# Patient Record
Sex: Female | Born: 1945
Health system: Southern US, Community
[De-identification: ages and names within clinical notes are randomized; demographics above are authoritative.]

## PROBLEM LIST (undated history)

## (undated) DIAGNOSIS — Z85528 Personal history of other malignant neoplasm of kidney: Secondary | ICD-10-CM

## (undated) DIAGNOSIS — K635 Polyp of colon: Secondary | ICD-10-CM

## (undated) DIAGNOSIS — M47812 Spondylosis without myelopathy or radiculopathy, cervical region: Secondary | ICD-10-CM

## (undated) DIAGNOSIS — K5732 Diverticulitis of large intestine without perforation or abscess without bleeding: Secondary | ICD-10-CM

## (undated) DIAGNOSIS — E785 Hyperlipidemia, unspecified: Secondary | ICD-10-CM

## (undated) DIAGNOSIS — I10 Essential (primary) hypertension: Secondary | ICD-10-CM

## (undated) DIAGNOSIS — G2581 Restless legs syndrome: Secondary | ICD-10-CM

## (undated) DIAGNOSIS — K219 Gastro-esophageal reflux disease without esophagitis: Secondary | ICD-10-CM

## (undated) DIAGNOSIS — I519 Heart disease, unspecified: Secondary | ICD-10-CM

## (undated) DIAGNOSIS — M810 Age-related osteoporosis without current pathological fracture: Secondary | ICD-10-CM

## (undated) DIAGNOSIS — M19019 Primary osteoarthritis, unspecified shoulder: Secondary | ICD-10-CM

## (undated) DIAGNOSIS — E119 Type 2 diabetes mellitus without complications: Secondary | ICD-10-CM

## (undated) DIAGNOSIS — D509 Iron deficiency anemia, unspecified: Secondary | ICD-10-CM

## (undated) HISTORY — DX: Iron deficiency anemia, unspecified: D50.9

## (undated) HISTORY — PX: KNEE SURGERY: SHX244

## (undated) HISTORY — DX: Type 2 diabetes mellitus without complications: E11.9

## (undated) HISTORY — DX: Restless legs syndrome: G25.81

## (undated) HISTORY — DX: Gastro-esophageal reflux disease without esophagitis: K21.9

## (undated) HISTORY — DX: Personal history of other malignant neoplasm of kidney: Z85.528

## (undated) HISTORY — DX: Polyp of colon: K63.5

## (undated) HISTORY — DX: Heart disease, unspecified: I51.9

## (undated) HISTORY — DX: Spondylosis without myelopathy or radiculopathy, cervical region: M47.812

## (undated) HISTORY — DX: Age-related osteoporosis without current pathological fracture: M81.0

## (undated) HISTORY — DX: Primary osteoarthritis, unspecified shoulder: M19.019

## (undated) HISTORY — PX: BUNIONECTOMY: SHX129

## (undated) HISTORY — DX: Essential (primary) hypertension: I10

## (undated) HISTORY — DX: Hyperlipidemia, unspecified: E78.5

## (undated) HISTORY — DX: Diverticulitis of large intestine without perforation or abscess without bleeding: K57.32

---

## 1975-03-18 HISTORY — PX: APPENDECTOMY: SHX54

## 1975-03-18 HISTORY — PX: VAGINAL HYSTERECTOMY: SUR661

## 2010-06-07 ENCOUNTER — Encounter (INDEPENDENT_AMBULATORY_CARE_PROVIDER_SITE_OTHER): Payer: Self-pay | Admitting: *Deleted

## 2010-07-01 ENCOUNTER — Ambulatory Visit: Payer: Self-pay | Admitting: Gastroenterology

## 2010-08-07 ENCOUNTER — Ambulatory Visit (INDEPENDENT_AMBULATORY_CARE_PROVIDER_SITE_OTHER): Payer: BC Managed Care – PPO | Admitting: Gastroenterology

## 2010-08-07 ENCOUNTER — Encounter: Payer: Self-pay | Admitting: Gastroenterology

## 2010-08-07 VITALS — BP 102/56 | HR 68 | Ht 61.0 in | Wt 181.0 lb

## 2010-08-07 DIAGNOSIS — R1032 Left lower quadrant pain: Secondary | ICD-10-CM

## 2010-08-07 NOTE — Patient Instructions (Signed)
Follow up with your primary Gastroenterologis, Dr. Jonelle Sports (you are already seeing him next month). A copy of this information will be made available to  Dr. Tomasa Blase.

## 2010-08-07 NOTE — Progress Notes (Signed)
HPI: This is a  very pleasant 65 year old woman  Was in urgent clinic and then ER.  She was told she had diverticulitis by one MD and that she had a "bug" by another MD.  She was put on 2 antibiotics that she cannot recall, given by her PCP, treated as if she had divericulitis.  Pain was in lower abdomen.  Hurt to sit, stand. She had a minor fever but no WBC.  A CT was "normal" per pcp note.    She really has felt fine since then.  No fevers or chills.  She sees Dr. Braulio Conte in [place] for her gastroenterology care.   She was going to see him for this but couldn't get in to see him soon enough so was set up here.  She sees hiim every 3-6 months and will be seeing him in less than a month ago.   Review of systems: Pertinent positive and negative review of systems were noted in the above HPI section.  All other review of systems was otherwise negative.   Past Medical History, Past Surgical History, Family History, Social History, Current Medications, Allergies were all reviewed with the patient via [facility] electronic medical record system.   Physical Exam: BP 102/56  Pulse 68  Ht 5\' 1"  (1.549 m)  Wt 181 lb (82.101 kg)  BMI 34.20 kg/m2 Constitutional: generally well-appearing Psychiatric: alert and oriented x3 Eyes: extraocular movements intact Mouth: oral pharynx moist, no lesions Neck: supple no lymphadenopathy Cardiovascular: heart regular rate and rhythm Lungs: clear to auscultation bilaterally Abdomen: soft, nontender, nondistended, no obvious ascites, no peritoneal signs, normal bowel sounds Extremities: no lower extremity edema bilaterally Skin: no lesions on visible extremities    Assessment and plan: 65 y.o. female with resolved left-sided abdominal pain  I did not mention above that she had a colonoscopy in 2008 by her primary gastroenterologist. She is scheduled to see him again next month since he follows her for her anemia. There are no acute GI issues  going on now I recommended that she continue her followup with her gastroenterologist, she is very happy with him and does not want to switch care. Her abdominal pains that led to her initial workup have resolved

## 2010-09-09 NOTE — Letter (Signed)
Summary: New Patient letter  [facility]  [address] [place], [place] [address]   Phone: [phone]  Fax: [phone]       06/07/2010 MRN: [id number]  Julia Logan [address] [place], [place]  [address]  [place]  Dear Ms. Julia Logan,  Welcome to the Gastroenterology Division at [facility].    You are scheduled to see Dr.  Christella Hartigan on 07-01-10 at 8:30A.M. on the 3rd floor at [facility], [address]  We ask that you try to arrive at our office 15 minutes prior to your appointment time to allow for check-in.  We would like you to complete the enclosed self-administered evaluation form prior to your visit and bring it with you on the day of your appointment.  We will review it with you.  Also, please bring a complete list of all your medications or, if you prefer, bring the medication bottles and we will list them.  Please bring your insurance card so that we may make a copy of it.  If your insurance requires a referral to see a specialist, please bring your referral form from your primary care physician.  Co-payments are due at the time of your visit and may be paid by cash, check or credit card.     Your office visit will consist of a consult with your physician (includes a physical exam), any laboratory testing he/she may order, scheduling of any necessary diagnostic testing (e.g. x-ray, ultrasound, CT-scan), and scheduling of a procedure (e.g. Endoscopy, Colonoscopy) if required.  Please allow enough time on your schedule to allow for any/all of these possibilities.    If you cannot keep your appointment, please call [phone] to cancel or reschedule prior to your appointment date.  This allows [place] the opportunity to schedule an appointment for another patient in need of care.  If you do not cancel or reschedule by 5 p.m. the business day prior to your appointment date, you will be charged a $50.00 late cancellation/no-show fee.    Thank you for choosing [facility] for your medical needs.  We appreciate the opportunity to care for you.  Please visit [place] at our website  to learn more about our practice.                     Sincerely,                                                             The Gastroenterology Division

## 2015-03-18 HISTORY — PX: PARTIAL NEPHRECTOMY: SHX414

## 2015-09-24 DIAGNOSIS — N390 Urinary tract infection, site not specified: Secondary | ICD-10-CM | POA: Insufficient documentation

## 2015-09-24 HISTORY — DX: Urinary tract infection, site not specified: N39.0

## 2015-10-04 DIAGNOSIS — C649 Malignant neoplasm of unspecified kidney, except renal pelvis: Secondary | ICD-10-CM

## 2015-10-04 HISTORY — DX: Malignant neoplasm of unspecified kidney, except renal pelvis: C64.9

## 2016-03-14 DIAGNOSIS — I2510 Atherosclerotic heart disease of native coronary artery without angina pectoris: Secondary | ICD-10-CM | POA: Insufficient documentation

## 2016-03-19 DIAGNOSIS — E785 Hyperlipidemia, unspecified: Secondary | ICD-10-CM | POA: Diagnosis not present

## 2016-03-19 DIAGNOSIS — E119 Type 2 diabetes mellitus without complications: Secondary | ICD-10-CM | POA: Diagnosis not present

## 2016-03-19 DIAGNOSIS — I2510 Atherosclerotic heart disease of native coronary artery without angina pectoris: Secondary | ICD-10-CM | POA: Diagnosis not present

## 2016-03-19 DIAGNOSIS — Z905 Acquired absence of kidney: Secondary | ICD-10-CM | POA: Diagnosis not present

## 2016-03-19 DIAGNOSIS — C649 Malignant neoplasm of unspecified kidney, except renal pelvis: Secondary | ICD-10-CM | POA: Diagnosis not present

## 2016-03-19 DIAGNOSIS — I214 Non-ST elevation (NSTEMI) myocardial infarction: Secondary | ICD-10-CM | POA: Diagnosis not present

## 2016-03-19 DIAGNOSIS — I10 Essential (primary) hypertension: Secondary | ICD-10-CM | POA: Diagnosis not present

## 2016-03-19 HISTORY — DX: Acquired absence of kidney: Z90.5

## 2016-03-25 DIAGNOSIS — Z6832 Body mass index (BMI) 32.0-32.9, adult: Secondary | ICD-10-CM | POA: Diagnosis not present

## 2016-03-25 DIAGNOSIS — E785 Hyperlipidemia, unspecified: Secondary | ICD-10-CM | POA: Diagnosis not present

## 2016-03-25 DIAGNOSIS — I2510 Atherosclerotic heart disease of native coronary artery without angina pectoris: Secondary | ICD-10-CM | POA: Diagnosis not present

## 2016-03-25 DIAGNOSIS — I10 Essential (primary) hypertension: Secondary | ICD-10-CM | POA: Diagnosis not present

## 2016-03-25 DIAGNOSIS — E1129 Type 2 diabetes mellitus with other diabetic kidney complication: Secondary | ICD-10-CM | POA: Diagnosis not present

## 2016-03-27 DIAGNOSIS — E119 Type 2 diabetes mellitus without complications: Secondary | ICD-10-CM | POA: Diagnosis not present

## 2016-03-27 DIAGNOSIS — K219 Gastro-esophageal reflux disease without esophagitis: Secondary | ICD-10-CM | POA: Diagnosis not present

## 2016-03-27 DIAGNOSIS — Z955 Presence of coronary angioplasty implant and graft: Secondary | ICD-10-CM | POA: Diagnosis not present

## 2016-03-27 DIAGNOSIS — I252 Old myocardial infarction: Secondary | ICD-10-CM | POA: Diagnosis not present

## 2016-04-18 DIAGNOSIS — Y718 Miscellaneous cardiovascular devices associated with adverse incidents, not elsewhere classified: Secondary | ICD-10-CM | POA: Diagnosis not present

## 2016-04-18 DIAGNOSIS — E119 Type 2 diabetes mellitus without complications: Secondary | ICD-10-CM | POA: Diagnosis not present

## 2016-04-18 DIAGNOSIS — Z79899 Other long term (current) drug therapy: Secondary | ICD-10-CM | POA: Diagnosis not present

## 2016-04-18 DIAGNOSIS — Z955 Presence of coronary angioplasty implant and graft: Secondary | ICD-10-CM | POA: Diagnosis not present

## 2016-04-18 DIAGNOSIS — I25110 Atherosclerotic heart disease of native coronary artery with unstable angina pectoris: Secondary | ICD-10-CM | POA: Diagnosis not present

## 2016-04-18 DIAGNOSIS — Z8249 Family history of ischemic heart disease and other diseases of the circulatory system: Secondary | ICD-10-CM | POA: Diagnosis not present

## 2016-04-18 DIAGNOSIS — I249 Acute ischemic heart disease, unspecified: Secondary | ICD-10-CM | POA: Diagnosis not present

## 2016-04-18 DIAGNOSIS — K219 Gastro-esophageal reflux disease without esophagitis: Secondary | ICD-10-CM | POA: Diagnosis not present

## 2016-04-18 DIAGNOSIS — T82855A Stenosis of coronary artery stent, initial encounter: Secondary | ICD-10-CM | POA: Diagnosis not present

## 2016-04-18 DIAGNOSIS — N183 Chronic kidney disease, stage 3 (moderate): Secondary | ICD-10-CM | POA: Diagnosis not present

## 2016-04-18 DIAGNOSIS — E7800 Pure hypercholesterolemia, unspecified: Secondary | ICD-10-CM | POA: Diagnosis not present

## 2016-04-18 DIAGNOSIS — I252 Old myocardial infarction: Secondary | ICD-10-CM | POA: Diagnosis not present

## 2016-04-18 DIAGNOSIS — N289 Disorder of kidney and ureter, unspecified: Secondary | ICD-10-CM | POA: Diagnosis not present

## 2016-04-18 DIAGNOSIS — I10 Essential (primary) hypertension: Secondary | ICD-10-CM | POA: Diagnosis not present

## 2016-04-18 DIAGNOSIS — I200 Unstable angina: Secondary | ICD-10-CM | POA: Diagnosis not present

## 2016-04-18 DIAGNOSIS — E1169 Type 2 diabetes mellitus with other specified complication: Secondary | ICD-10-CM | POA: Diagnosis not present

## 2016-04-18 DIAGNOSIS — R0602 Shortness of breath: Secondary | ICD-10-CM | POA: Diagnosis not present

## 2016-04-18 DIAGNOSIS — E784 Other hyperlipidemia: Secondary | ICD-10-CM | POA: Diagnosis not present

## 2016-04-18 DIAGNOSIS — Z7982 Long term (current) use of aspirin: Secondary | ICD-10-CM | POA: Diagnosis not present

## 2016-04-18 DIAGNOSIS — Z905 Acquired absence of kidney: Secondary | ICD-10-CM | POA: Diagnosis not present

## 2016-04-18 DIAGNOSIS — E669 Obesity, unspecified: Secondary | ICD-10-CM | POA: Diagnosis not present

## 2016-04-18 DIAGNOSIS — R079 Chest pain, unspecified: Secondary | ICD-10-CM | POA: Diagnosis not present

## 2016-04-19 DIAGNOSIS — I25118 Atherosclerotic heart disease of native coronary artery with other forms of angina pectoris: Secondary | ICD-10-CM | POA: Diagnosis not present

## 2016-04-19 DIAGNOSIS — E119 Type 2 diabetes mellitus without complications: Secondary | ICD-10-CM | POA: Diagnosis not present

## 2016-04-19 DIAGNOSIS — Z955 Presence of coronary angioplasty implant and graft: Secondary | ICD-10-CM | POA: Diagnosis not present

## 2016-04-19 DIAGNOSIS — E7800 Pure hypercholesterolemia, unspecified: Secondary | ICD-10-CM | POA: Diagnosis not present

## 2016-04-19 DIAGNOSIS — I119 Hypertensive heart disease without heart failure: Secondary | ICD-10-CM | POA: Diagnosis not present

## 2016-04-21 DIAGNOSIS — Z955 Presence of coronary angioplasty implant and graft: Secondary | ICD-10-CM | POA: Diagnosis not present

## 2016-04-21 DIAGNOSIS — I252 Old myocardial infarction: Secondary | ICD-10-CM | POA: Diagnosis not present

## 2016-04-29 DIAGNOSIS — E1165 Type 2 diabetes mellitus with hyperglycemia: Secondary | ICD-10-CM | POA: Diagnosis not present

## 2016-04-29 DIAGNOSIS — I2510 Atherosclerotic heart disease of native coronary artery without angina pectoris: Secondary | ICD-10-CM | POA: Diagnosis not present

## 2016-04-29 DIAGNOSIS — E1129 Type 2 diabetes mellitus with other diabetic kidney complication: Secondary | ICD-10-CM | POA: Diagnosis not present

## 2016-04-29 DIAGNOSIS — Z6830 Body mass index (BMI) 30.0-30.9, adult: Secondary | ICD-10-CM | POA: Diagnosis not present

## 2016-04-29 DIAGNOSIS — D509 Iron deficiency anemia, unspecified: Secondary | ICD-10-CM | POA: Diagnosis not present

## 2016-04-29 DIAGNOSIS — E785 Hyperlipidemia, unspecified: Secondary | ICD-10-CM | POA: Diagnosis not present

## 2016-05-08 DIAGNOSIS — I2510 Atherosclerotic heart disease of native coronary artery without angina pectoris: Secondary | ICD-10-CM | POA: Diagnosis not present

## 2016-05-08 DIAGNOSIS — E782 Mixed hyperlipidemia: Secondary | ICD-10-CM | POA: Diagnosis not present

## 2016-05-08 DIAGNOSIS — E0800 Diabetes mellitus due to underlying condition with hyperosmolarity without nonketotic hyperglycemic-hyperosmolar coma (NKHHC): Secondary | ICD-10-CM | POA: Diagnosis not present

## 2016-05-08 DIAGNOSIS — I10 Essential (primary) hypertension: Secondary | ICD-10-CM | POA: Diagnosis not present

## 2016-05-08 DIAGNOSIS — Z7984 Long term (current) use of oral hypoglycemic drugs: Secondary | ICD-10-CM | POA: Diagnosis not present

## 2016-05-08 DIAGNOSIS — H524 Presbyopia: Secondary | ICD-10-CM | POA: Diagnosis not present

## 2016-05-08 DIAGNOSIS — H25813 Combined forms of age-related cataract, bilateral: Secondary | ICD-10-CM | POA: Diagnosis not present

## 2016-05-08 DIAGNOSIS — H5203 Hypermetropia, bilateral: Secondary | ICD-10-CM | POA: Diagnosis not present

## 2016-05-08 DIAGNOSIS — E119 Type 2 diabetes mellitus without complications: Secondary | ICD-10-CM | POA: Diagnosis not present

## 2016-05-14 DIAGNOSIS — D500 Iron deficiency anemia secondary to blood loss (chronic): Secondary | ICD-10-CM | POA: Diagnosis not present

## 2016-05-14 DIAGNOSIS — K219 Gastro-esophageal reflux disease without esophagitis: Secondary | ICD-10-CM | POA: Diagnosis not present

## 2016-05-14 DIAGNOSIS — D131 Benign neoplasm of stomach: Secondary | ICD-10-CM | POA: Diagnosis not present

## 2016-05-16 DIAGNOSIS — I252 Old myocardial infarction: Secondary | ICD-10-CM | POA: Diagnosis not present

## 2016-05-27 DIAGNOSIS — Z6830 Body mass index (BMI) 30.0-30.9, adult: Secondary | ICD-10-CM | POA: Diagnosis not present

## 2016-05-27 DIAGNOSIS — N390 Urinary tract infection, site not specified: Secondary | ICD-10-CM | POA: Diagnosis not present

## 2016-06-16 DIAGNOSIS — Z955 Presence of coronary angioplasty implant and graft: Secondary | ICD-10-CM | POA: Diagnosis not present

## 2016-06-16 DIAGNOSIS — I252 Old myocardial infarction: Secondary | ICD-10-CM | POA: Diagnosis not present

## 2016-07-28 DIAGNOSIS — Z1231 Encounter for screening mammogram for malignant neoplasm of breast: Secondary | ICD-10-CM | POA: Diagnosis not present

## 2016-07-28 DIAGNOSIS — E785 Hyperlipidemia, unspecified: Secondary | ICD-10-CM | POA: Diagnosis not present

## 2016-07-28 DIAGNOSIS — Z6830 Body mass index (BMI) 30.0-30.9, adult: Secondary | ICD-10-CM | POA: Diagnosis not present

## 2016-07-28 DIAGNOSIS — Z139 Encounter for screening, unspecified: Secondary | ICD-10-CM | POA: Diagnosis not present

## 2016-07-28 DIAGNOSIS — E1129 Type 2 diabetes mellitus with other diabetic kidney complication: Secondary | ICD-10-CM | POA: Diagnosis not present

## 2016-07-28 DIAGNOSIS — D509 Iron deficiency anemia, unspecified: Secondary | ICD-10-CM | POA: Diagnosis not present

## 2016-07-28 DIAGNOSIS — I2510 Atherosclerotic heart disease of native coronary artery without angina pectoris: Secondary | ICD-10-CM | POA: Diagnosis not present

## 2016-08-07 DIAGNOSIS — C649 Malignant neoplasm of unspecified kidney, except renal pelvis: Secondary | ICD-10-CM | POA: Diagnosis not present

## 2016-08-12 DIAGNOSIS — I10 Essential (primary) hypertension: Secondary | ICD-10-CM | POA: Diagnosis not present

## 2016-08-12 DIAGNOSIS — E0800 Diabetes mellitus due to underlying condition with hyperosmolarity without nonketotic hyperglycemic-hyperosmolar coma (NKHHC): Secondary | ICD-10-CM | POA: Diagnosis not present

## 2016-08-12 DIAGNOSIS — E782 Mixed hyperlipidemia: Secondary | ICD-10-CM | POA: Diagnosis not present

## 2016-08-12 DIAGNOSIS — I2510 Atherosclerotic heart disease of native coronary artery without angina pectoris: Secondary | ICD-10-CM | POA: Diagnosis not present

## 2016-08-14 DIAGNOSIS — N952 Postmenopausal atrophic vaginitis: Secondary | ICD-10-CM | POA: Insufficient documentation

## 2016-08-14 DIAGNOSIS — Z85528 Personal history of other malignant neoplasm of kidney: Secondary | ICD-10-CM | POA: Diagnosis not present

## 2016-08-14 DIAGNOSIS — C649 Malignant neoplasm of unspecified kidney, except renal pelvis: Secondary | ICD-10-CM | POA: Diagnosis not present

## 2016-08-14 DIAGNOSIS — C642 Malignant neoplasm of left kidney, except renal pelvis: Secondary | ICD-10-CM | POA: Diagnosis not present

## 2016-08-14 HISTORY — DX: Postmenopausal atrophic vaginitis: N95.2

## 2016-08-15 DIAGNOSIS — Z6829 Body mass index (BMI) 29.0-29.9, adult: Secondary | ICD-10-CM | POA: Diagnosis not present

## 2016-08-15 DIAGNOSIS — F329 Major depressive disorder, single episode, unspecified: Secondary | ICD-10-CM | POA: Diagnosis not present

## 2016-09-12 DIAGNOSIS — F329 Major depressive disorder, single episode, unspecified: Secondary | ICD-10-CM | POA: Diagnosis not present

## 2016-09-12 DIAGNOSIS — Z6829 Body mass index (BMI) 29.0-29.9, adult: Secondary | ICD-10-CM | POA: Diagnosis not present

## 2016-10-07 DIAGNOSIS — Z1231 Encounter for screening mammogram for malignant neoplasm of breast: Secondary | ICD-10-CM | POA: Diagnosis not present

## 2016-10-31 DIAGNOSIS — I2510 Atherosclerotic heart disease of native coronary artery without angina pectoris: Secondary | ICD-10-CM | POA: Diagnosis not present

## 2016-10-31 DIAGNOSIS — Z1389 Encounter for screening for other disorder: Secondary | ICD-10-CM | POA: Diagnosis not present

## 2016-10-31 DIAGNOSIS — F329 Major depressive disorder, single episode, unspecified: Secondary | ICD-10-CM | POA: Diagnosis not present

## 2016-10-31 DIAGNOSIS — Z9181 History of falling: Secondary | ICD-10-CM | POA: Diagnosis not present

## 2016-10-31 DIAGNOSIS — D509 Iron deficiency anemia, unspecified: Secondary | ICD-10-CM | POA: Diagnosis not present

## 2016-10-31 DIAGNOSIS — E1129 Type 2 diabetes mellitus with other diabetic kidney complication: Secondary | ICD-10-CM | POA: Diagnosis not present

## 2016-10-31 DIAGNOSIS — E785 Hyperlipidemia, unspecified: Secondary | ICD-10-CM | POA: Diagnosis not present

## 2016-10-31 DIAGNOSIS — Z139 Encounter for screening, unspecified: Secondary | ICD-10-CM | POA: Diagnosis not present

## 2016-10-31 DIAGNOSIS — Z6830 Body mass index (BMI) 30.0-30.9, adult: Secondary | ICD-10-CM | POA: Diagnosis not present

## 2016-12-31 ENCOUNTER — Encounter: Payer: Self-pay | Admitting: Cardiology

## 2016-12-31 ENCOUNTER — Ambulatory Visit (HOSPITAL_BASED_OUTPATIENT_CLINIC_OR_DEPARTMENT_OTHER)
Admission: RE | Admit: 2016-12-31 | Discharge: 2016-12-31 | Disposition: A | Discharge status: Home / Self Care | Payer: PPO | Source: Ambulatory Visit | Attending: Cardiology | Admitting: Cardiology

## 2016-12-31 ENCOUNTER — Ambulatory Visit (INDEPENDENT_AMBULATORY_CARE_PROVIDER_SITE_OTHER): Payer: PPO | Admitting: Cardiology

## 2016-12-31 VITALS — BP 132/60 | HR 59 | Ht 61.0 in | Wt 166.0 lb

## 2016-12-31 DIAGNOSIS — I10 Essential (primary) hypertension: Secondary | ICD-10-CM

## 2016-12-31 DIAGNOSIS — I517 Cardiomegaly: Secondary | ICD-10-CM | POA: Diagnosis not present

## 2016-12-31 DIAGNOSIS — I700 Atherosclerosis of aorta: Secondary | ICD-10-CM | POA: Diagnosis not present

## 2016-12-31 DIAGNOSIS — I2510 Atherosclerotic heart disease of native coronary artery without angina pectoris: Secondary | ICD-10-CM | POA: Diagnosis not present

## 2016-12-31 DIAGNOSIS — E119 Type 2 diabetes mellitus without complications: Secondary | ICD-10-CM

## 2016-12-31 DIAGNOSIS — R0602 Shortness of breath: Secondary | ICD-10-CM | POA: Diagnosis not present

## 2016-12-31 DIAGNOSIS — E0800 Diabetes mellitus due to underlying condition with hyperosmolarity without nonketotic hyperglycemic-hyperosmolar coma (NKHHC): Secondary | ICD-10-CM

## 2016-12-31 DIAGNOSIS — R079 Chest pain, unspecified: Secondary | ICD-10-CM

## 2016-12-31 DIAGNOSIS — I209 Angina pectoris, unspecified: Secondary | ICD-10-CM | POA: Diagnosis not present

## 2016-12-31 DIAGNOSIS — Z905 Acquired absence of kidney: Secondary | ICD-10-CM

## 2016-12-31 HISTORY — DX: Essential (primary) hypertension: I10

## 2016-12-31 HISTORY — DX: Type 2 diabetes mellitus without complications: E11.9

## 2016-12-31 NOTE — Patient Instructions (Signed)
Medication Instructions:   Your physician recommends that you continue on your current medications as directed. Please refer to the Current Medication list given to you today.    Labwork: Your physician recommends that you return for lab work in: CBC, BMP, PT/INR today   Testing/Procedures: A chest x-ray takes a picture of the organs and structures inside the chest, including the heart, lungs, and blood vessels. This test can show several things, including, whether the heart is enlarges; whether fluid is building up in the lungs; and whether pacemaker / defibrillator leads are still in place.    [facility] [place] [address], [place] [address] Dept: [phone] Loc: [phone]  MICHAELEEN DOWN  12/31/2016  You are scheduled for a  Left Heart Cath  on Thursday, October 18 th  with Dr. Martinique.   1. Please arrive at the [facility] (Main Entrance A) at [facility]: [address] [place],  [address] at 11:30 am  (two hours before your procedure to ensure your preparation). Free valet parking service is available.   Special note: Every effort is made to have your procedure done on time. Please understand that emergencies sometimes delay scheduled procedures.  2. Diet: Nothing to eat or drink after midnight tonight.   3. Labs: CBC, BMP, PT/INR done today in office   4. Medication instructions in preparation for your procedure:   Do not take Janumet the morning of you cath.     On the morning of your procedure, take your aspirin and any morning medicines NOT listed above.  You may use sips of water.  5. Plan for one night stay--bring personal belongings. 6. Bring a current list of your medications and current insurance cards. 7. You MUST have a responsible person to drive you home. 8. Someone MUST be with you the first 24 hours after you arrive home or your discharge will  be delayed. 9. Please wear clothes that are easy to get on and off and wear slip-on shoes.  Thank you for allowing [place] to care for you!   -- [facility]   Follow-Up: Your physician recommends that you schedule a follow-up appointment in: 4 -6 weeks with Dr. Geraldo Pitter.    Any Other Special Instructions Will Be Listed Below (If Applicable).     If you need a refill on your cardiac medications before your next appointment, please call your pharmacy.

## 2016-12-31 NOTE — Progress Notes (Signed)
Cardiology Office Note:    Date:  12/31/2016   ID:  Julia Logan, DOB 1945-12-17, MRN [id number]  PCP:  Nicoletta Dress, MD  Cardiologist:  Jenean Lindau, MD   Referring MD: Nicoletta Dress, MD    ASSESSMENT:    1. Chest pain, unspecified type   2. SOB (shortness of breath)   3. Angina pectoris ([place])    PLAN:    In order of problems listed above:  1. Secondary prevention stressed to the patient. Importance of compliance with diet and medications stressed and she vocalized understanding.I discussed coronary angiography and left heart catheterization with the patient at extensive length. Procedure, benefits and potential risks were explained. Patient had multiple questions which were answered to the patient's satisfaction. Patient agreed and consented for the procedure. Further recommendations will be made based on the findings of the coronary angiography. In the interim. The patient has any significant symptoms he knows to go to the nearest emergency room. 2. IN VIEW OF THE FACT THAT SHE HAS HAD A NEPHRECTOMY I WOULD PREFER THAT SHE GETS ONLY CORONARY ANGIOGRAPHY AND I DO NOT SEE MUCH VALUE FOR LEFT VENTRICULOGRAPHY, UNDERSTANDING THE RISKS AND BENEFITS. 3. Her blood pressure is stable. Diet was discussed with dyslipidemia and diabetes mellitus. She was advised to keep well hydrated in preparation for the coronary angiography. Benefits and risks especially noting that she has nephrectomy were also stressed upon.   Medication Adjustments/Labs and Tests Ordered: Current medicines are reviewed at length with the patient today.  Concerns regarding medicines are outlined above.  Orders Placed This Encounter  Procedures  . DG Chest 2 View  . CBC with Differential/Platelet  . Basic metabolic panel  . Protime-INR   No orders of the defined types were placed in this encounter.    History of Present Illness:    Julia Logan is a 71 y.o. female who is being seen today for the  evaluation of chest pain on exertion at the request of Nicoletta Dress, MD. Patient is a pleasant 71 year old female. She has past medical history of coronary artery disease.She has undergone coronary stenting in the past. Now she comes to see me in this office. I have Taken care of her in my previous practice and she wants her care to be transferred.she mentions to me that upon exertion she is having chest tightness radiating to the arms and to the neck. No orthopnea or PND. This is consistently occurring with exertion and this has affected her quality of life and she has not used nitroglycerin sublingually.at the time of my evaluation she is alert awake oriented and in no distress.  Past Medical History:  Diagnosis Date  . Cervical spondylosis without myelopathy   . Colon polyps   . Diverticulitis of colon (without mention of hemorrhage)(562.11)   . Esophageal reflux   . Essential hypertension, benign   . Iron deficiency anemia, unspecified   . Osteoarthrosis, unspecified whether generalized or localized, shoulder region   . Osteoporosis   . Other and unspecified hyperlipidemia   . Restless legs syndrome (RLS)   . Type II or unspecified type diabetes mellitus without mention of complication, not stated as uncontrolled     Past Surgical History:  Procedure Laterality Date  . APPENDECTOMY  1977  . BUNIONECTOMY    . KNEE SURGERY    . VAGINAL HYSTERECTOMY  1977    Current Medications: Current Meds  Medication Sig  . alendronate (FOSAMAX) 70 MG tablet Take 70  mg by mouth every 7 (seven) days. Take with a full glass of water on an empty stomach.   Marland Kitchen amLODipine-valsartan (EXFORGE) 5-320 MG per tablet Take 1 tablet by mouth daily.    Marland Kitchen aspirin 81 MG chewable tablet CHEW AND SWALLOW 1 TABLET(81 MG) BY MOUTH DAILY  . atorvastatin (LIPITOR) 80 MG tablet Take 80 mg by mouth daily.  . Calcium Carbonate-Vit D-Min (CALCIUM 1200) 1200-1000 MG-UNIT CHEW Chew 1 tablet by mouth 2 (two) times daily.    . Cholecalciferol (VITAMIN D) 400 UNITS capsule Take 800 Units by mouth daily.    Marland Kitchen esomeprazole (NEXIUM) 40 MG capsule Take 40 mg by mouth daily before breakfast.    . Ferrous Sulfate (IRON) 325 (65 Fe) MG TABS Take 1 tablet by mouth daily.  Marland Kitchen JANUMET 50-500 MG tablet Take 1 tablet by mouth 2 (two) times daily.  Marland Kitchen lisinopril (PRINIVIL,ZESTRIL) 40 MG tablet Take 40 mg by mouth daily.   . metoprolol tartrate (LOPRESSOR) 25 MG tablet Take 12.5 mg by mouth 2 (two) times daily.  . nitroGLYCERIN (NITROSTAT) 0.4 MG SL tablet Place 0.4 mg under the tongue every 5 (five) minutes x 3 doses as needed for chest pain.  . prasugrel (EFFIENT) 10 MG TABS tablet TAKE 1 TABLET(10 MG) BY MOUTH DAILY  . venlafaxine XR (EFFEXOR-XR) 75 MG 24 hr capsule TK 1 C PO D     Allergies:   Gabapentin; Ticagrelor; and Codeine   Social History   Social History  . Marital status: Married    Spouse name: N/A  . Number of children: 2  . Years of education: N/A   Occupational History  . retired    Social History Main Topics  . Smoking status: Never Smoker  . Smokeless tobacco: Never Used  . Alcohol use No  . Drug use: No  . Sexual activity: Not on file   Other Topics Concern  . Not on file   Social History Narrative  . No narrative on file     Family History: The patient's family history includes Diabetes in her maternal grandfather. There is no history of Colon cancer.  ROS:   Please see the history of present illness.    All other systems reviewed and are negative.  EKGs/Labs/Other Studies Reviewed:    The following studies were reviewed today: I reviewed EKG and this reveals sinus rhythm and nonspecific ST-T changes. Bradycardia. Was evident.   Recent Labs: No results found for requested labs within last 8760 hours.  Recent Lipid Panel No results found for: CHOL, TRIG, HDL, CHOLHDL, VLDL, LDLCALC, LDLDIRECT  Physical Exam:    VS:  BP 132/60 (BP Location: Right Arm, Patient Position:  Sitting)   Pulse (!) 59   Ht 5\' 1"  (1.549 m)   Wt 166 lb (75.3 kg)   SpO2 96%   BMI 31.37 kg/m     Wt Readings from Last 3 Encounters:  12/31/16 166 lb (75.3 kg)  08/07/10 181 lb (82.1 kg)     GEN: Patient is in no acute distress HEENT: Normal NECK: No JVD; No carotid bruits LYMPHATICS: No lymphadenopathy CARDIAC: S1 S2 regular, 2/6 systolic murmur at the apex. RESPIRATORY:  Clear to auscultation without rales, wheezing or rhonchi  ABDOMEN: Soft, non-tender, non-distended MUSCULOSKELETAL:  No edema; No deformity  SKIN: Warm and dry NEUROLOGIC:  Alert and oriented x 3 PSYCHIATRIC:  Normal affect    Signed, Jenean Lindau, MD  12/31/2016 2:42 PM    [facility]

## 2017-01-01 ENCOUNTER — Observation Stay (HOSPITAL_COMMUNITY)
Admission: RE | Admit: 2017-01-01 | Discharge: 2017-01-02 | Disposition: A | Discharge status: Home / Self Care | Payer: PPO | Source: Ambulatory Visit | Attending: Cardiology | Admitting: Cardiology

## 2017-01-01 ENCOUNTER — Ambulatory Visit (HOSPITAL_COMMUNITY)
Admission: RE | Disposition: A | Discharge status: Home / Self Care | Payer: Self-pay | Source: Ambulatory Visit | Attending: Cardiology

## 2017-01-01 DIAGNOSIS — Z955 Presence of coronary angioplasty implant and graft: Secondary | ICD-10-CM

## 2017-01-01 DIAGNOSIS — Y840 Cardiac catheterization as the cause of abnormal reaction of the patient, or of later complication, without mention of misadventure at the time of the procedure: Secondary | ICD-10-CM | POA: Insufficient documentation

## 2017-01-01 DIAGNOSIS — G2581 Restless legs syndrome: Secondary | ICD-10-CM | POA: Diagnosis not present

## 2017-01-01 DIAGNOSIS — I97630 Postprocedural hematoma of a circulatory system organ or structure following a cardiac catheterization: Secondary | ICD-10-CM | POA: Insufficient documentation

## 2017-01-01 DIAGNOSIS — Z7982 Long term (current) use of aspirin: Secondary | ICD-10-CM | POA: Diagnosis not present

## 2017-01-01 DIAGNOSIS — E785 Hyperlipidemia, unspecified: Secondary | ICD-10-CM | POA: Diagnosis not present

## 2017-01-01 DIAGNOSIS — T82855A Stenosis of coronary artery stent, initial encounter: Secondary | ICD-10-CM | POA: Insufficient documentation

## 2017-01-01 DIAGNOSIS — I2510 Atherosclerotic heart disease of native coronary artery without angina pectoris: Secondary | ICD-10-CM | POA: Diagnosis present

## 2017-01-01 DIAGNOSIS — E119 Type 2 diabetes mellitus without complications: Secondary | ICD-10-CM | POA: Diagnosis not present

## 2017-01-01 DIAGNOSIS — K219 Gastro-esophageal reflux disease without esophagitis: Secondary | ICD-10-CM | POA: Insufficient documentation

## 2017-01-01 DIAGNOSIS — Y831 Surgical operation with implant of artificial internal device as the cause of abnormal reaction of the patient, or of later complication, without mention of misadventure at the time of the procedure: Secondary | ICD-10-CM | POA: Insufficient documentation

## 2017-01-01 DIAGNOSIS — M810 Age-related osteoporosis without current pathological fracture: Secondary | ICD-10-CM | POA: Diagnosis not present

## 2017-01-01 DIAGNOSIS — I209 Angina pectoris, unspecified: Secondary | ICD-10-CM

## 2017-01-01 DIAGNOSIS — I25119 Atherosclerotic heart disease of native coronary artery with unspecified angina pectoris: Secondary | ICD-10-CM | POA: Diagnosis not present

## 2017-01-01 DIAGNOSIS — M19019 Primary osteoarthritis, unspecified shoulder: Secondary | ICD-10-CM | POA: Insufficient documentation

## 2017-01-01 DIAGNOSIS — Z905 Acquired absence of kidney: Secondary | ICD-10-CM | POA: Insufficient documentation

## 2017-01-01 DIAGNOSIS — I10 Essential (primary) hypertension: Secondary | ICD-10-CM | POA: Diagnosis not present

## 2017-01-01 HISTORY — PX: LEFT HEART CATH AND CORONARY ANGIOGRAPHY: CATH118249

## 2017-01-01 HISTORY — DX: Atherosclerotic heart disease of native coronary artery with unspecified angina pectoris: I25.119

## 2017-01-01 HISTORY — PX: CORONARY STENT INTERVENTION: CATH118234

## 2017-01-01 LAB — CBC WITH DIFFERENTIAL/PLATELET
BASOS ABS: 0.1 10*3/uL (ref 0.0–0.2)
Basos: 1 %
EOS (ABSOLUTE): 0.5 10*3/uL — AB (ref 0.0–0.4)
Eos: 9 %
HEMOGLOBIN: 11.6 g/dL (ref 11.1–15.9)
Hematocrit: 35.7 % (ref 34.0–46.6)
IMMATURE GRANS (ABS): 0 10*3/uL (ref 0.0–0.1)
Immature Granulocytes: 0 %
LYMPHS: 27 %
Lymphocytes Absolute: 1.5 10*3/uL (ref 0.7–3.1)
MCH: 27.7 pg (ref 26.6–33.0)
MCHC: 32.5 g/dL (ref 31.5–35.7)
MCV: 85 fL (ref 79–97)
MONOCYTES: 9 %
Monocytes Absolute: 0.5 10*3/uL (ref 0.1–0.9)
NEUTROS ABS: 3 10*3/uL (ref 1.4–7.0)
NEUTROS PCT: 54 %
PLATELETS: 232 10*3/uL (ref 150–379)
RBC: 4.19 x10E6/uL (ref 3.77–5.28)
RDW: 13.8 % (ref 12.3–15.4)
WBC: 5.5 10*3/uL (ref 3.4–10.8)

## 2017-01-01 LAB — BASIC METABOLIC PANEL
BUN / CREAT RATIO: 22 (ref 12–28)
BUN: 24 mg/dL (ref 8–27)
CHLORIDE: 103 mmol/L (ref 96–106)
CO2: 23 mmol/L (ref 20–29)
Calcium: 10.1 mg/dL (ref 8.7–10.3)
Creatinine, Ser: 1.1 mg/dL — ABNORMAL HIGH (ref 0.57–1.00)
GFR calc non Af Amer: 51 mL/min/{1.73_m2} — ABNORMAL LOW (ref >59)
GFR, EST AFRICAN AMERICAN: 58 mL/min/{1.73_m2} — AB (ref >59)
GLUCOSE: 123 mg/dL — AB (ref 65–99)
Potassium: 4.2 mmol/L (ref 3.5–5.2)
SODIUM: 141 mmol/L (ref 134–144)

## 2017-01-01 LAB — POCT ACTIVATED CLOTTING TIME: Activated Clotting Time: 466 seconds

## 2017-01-01 LAB — GLUCOSE, CAPILLARY
GLUCOSE-CAPILLARY: 101 mg/dL — AB (ref 65–99)
Glucose-Capillary: 112 mg/dL — ABNORMAL HIGH (ref 65–99)

## 2017-01-01 LAB — PROTIME-INR
INR: 1 (ref 0.8–1.2)
Prothrombin Time: 10 s (ref 9.1–12.0)

## 2017-01-01 SURGERY — LEFT HEART CATH AND CORONARY ANGIOGRAPHY
Anesthesia: LOCAL

## 2017-01-01 MED ORDER — SODIUM CHLORIDE 0.9 % IV SOLN: 250.0000 mL | INTRAVENOUS | Status: DC | PRN

## 2017-01-01 MED ORDER — SODIUM CHLORIDE 0.9 % WEIGHT BASED INFUSION: 1.0000 mL/kg/h | INTRAVENOUS | Status: AC

## 2017-01-01 MED ORDER — PANTOPRAZOLE SODIUM 40 MG PO TBEC
40.0000 mg | DELAYED_RELEASE_TABLET | Freq: Every day | ORAL | Status: DC
  Administered 2017-01-02: 40 mg via ORAL
  Filled 2017-01-01: qty 1

## 2017-01-01 MED ORDER — NITROGLYCERIN 0.4 MG SL SUBL: 0.4000 mg | SUBLINGUAL_TABLET | SUBLINGUAL | Status: DC | PRN

## 2017-01-01 MED ORDER — HYDRALAZINE HCL 20 MG/ML IJ SOLN
INTRAMUSCULAR | Status: AC
  Filled 2017-01-01: qty 1

## 2017-01-01 MED ORDER — FENTANYL CITRATE (PF) 100 MCG/2ML IJ SOLN
INTRAMUSCULAR | Status: AC
  Filled 2017-01-01: qty 2

## 2017-01-01 MED ORDER — LISINOPRIL 40 MG PO TABS
40.0000 mg | ORAL_TABLET | Freq: Every day | ORAL | Status: DC
  Administered 2017-01-02: 40 mg via ORAL
  Filled 2017-01-01: qty 1

## 2017-01-01 MED ORDER — CALCIUM CARBONATE ANTACID 500 MG PO CHEW
500.0000 mg | CHEWABLE_TABLET | Freq: Two times a day (BID) | ORAL | Status: DC
  Administered 2017-01-01 – 2017-01-02 (×2): 500 mg via ORAL
  Filled 2017-01-01 (×2): qty 3

## 2017-01-01 MED ORDER — CHOLECALCIFEROL 10 MCG (400 UNIT) PO TABS
800.0000 [IU] | ORAL_TABLET | Freq: Every day | ORAL | Status: DC
  Administered 2017-01-02: 800 [IU] via ORAL
  Filled 2017-01-01: qty 2

## 2017-01-01 MED ORDER — LABETALOL HCL 5 MG/ML IV SOLN: 10.0000 mg | INTRAVENOUS | Status: AC | PRN

## 2017-01-01 MED ORDER — SODIUM CHLORIDE 0.9 % WEIGHT BASED INFUSION: 1.0000 mL/kg/h | INTRAVENOUS | Status: DC

## 2017-01-01 MED ORDER — VERAPAMIL HCL 2.5 MG/ML IV SOLN
INTRAVENOUS | Status: AC
  Filled 2017-01-01: qty 2

## 2017-01-01 MED ORDER — FENTANYL CITRATE (PF) 100 MCG/2ML IJ SOLN
INTRAMUSCULAR | Status: DC | PRN
  Administered 2017-01-01: 25 ug via INTRAVENOUS

## 2017-01-01 MED ORDER — HEPARIN SODIUM (PORCINE) 1000 UNIT/ML IJ SOLN
INTRAMUSCULAR | Status: AC
  Filled 2017-01-01: qty 1

## 2017-01-01 MED ORDER — SODIUM CHLORIDE 0.9% FLUSH: 3.0000 mL | Freq: Two times a day (BID) | INTRAVENOUS | Status: DC

## 2017-01-01 MED ORDER — IOPAMIDOL (ISOVUE-370) INJECTION 76%
INTRAVENOUS | Status: AC
  Filled 2017-01-01: qty 100

## 2017-01-01 MED ORDER — ALENDRONATE SODIUM 70 MG PO TABS: 70.0000 mg | ORAL_TABLET | ORAL | Status: DC

## 2017-01-01 MED ORDER — HEPARIN (PORCINE) IN NACL 2-0.9 UNIT/ML-% IJ SOLN
INTRAMUSCULAR | Status: AC
  Filled 2017-01-01: qty 1000

## 2017-01-01 MED ORDER — HYDRALAZINE HCL 20 MG/ML IJ SOLN
5.0000 mg | INTRAMUSCULAR | Status: AC | PRN
  Administered 2017-01-01: 5 mg via INTRAVENOUS

## 2017-01-01 MED ORDER — ATORVASTATIN CALCIUM 80 MG PO TABS
80.0000 mg | ORAL_TABLET | Freq: Every day | ORAL | Status: DC
Start: 2017-01-02 — End: 2017-01-02

## 2017-01-01 MED ORDER — VITAMIN D 1000 UNITS PO TABS
1000.0000 [IU] | ORAL_TABLET | Freq: Two times a day (BID) | ORAL | Status: DC
  Administered 2017-01-01 – 2017-01-02 (×2): 1000 [IU] via ORAL
  Filled 2017-01-01 (×2): qty 1

## 2017-01-01 MED ORDER — LIDOCAINE HCL (PF) 1 % IJ SOLN
INTRAMUSCULAR | Status: DC | PRN
  Administered 2017-01-01: 2 mL via INTRADERMAL

## 2017-01-01 MED ORDER — VENLAFAXINE HCL ER 75 MG PO CP24
75.0000 mg | ORAL_CAPSULE | Freq: Every day | ORAL | Status: DC
  Administered 2017-01-02: 75 mg via ORAL
  Filled 2017-01-01: qty 1

## 2017-01-01 MED ORDER — MIDAZOLAM HCL 2 MG/2ML IJ SOLN
INTRAMUSCULAR | Status: DC | PRN
  Administered 2017-01-01: 1 mg via INTRAVENOUS

## 2017-01-01 MED ORDER — FERROUS SULFATE 325 (65 FE) MG PO TABS
325.0000 mg | ORAL_TABLET | Freq: Two times a day (BID) | ORAL | Status: DC
  Administered 2017-01-01 – 2017-01-02 (×2): 325 mg via ORAL
  Filled 2017-01-01 (×2): qty 1

## 2017-01-01 MED ORDER — VERAPAMIL HCL 2.5 MG/ML IV SOLN
INTRAVENOUS | Status: DC | PRN
  Administered 2017-01-01: 10 mL via INTRA_ARTERIAL

## 2017-01-01 MED ORDER — SODIUM CHLORIDE 0.9 % WEIGHT BASED INFUSION
3.0000 mL/kg/h | INTRAVENOUS | Status: DC
  Administered 2017-01-01: 3 mL/kg/h via INTRAVENOUS

## 2017-01-01 MED ORDER — ASPIRIN 81 MG PO CHEW: 81.0000 mg | CHEWABLE_TABLET | ORAL | Status: DC

## 2017-01-01 MED ORDER — CALCIUM 1200 1200-1000 MG-UNIT PO CHEW: 1.0000 | CHEWABLE_TABLET | Freq: Two times a day (BID) | ORAL | Status: DC

## 2017-01-01 MED ORDER — HYDRALAZINE HCL 20 MG/ML IJ SOLN
INTRAMUSCULAR | Status: DC | PRN
  Administered 2017-01-01: 10 mg via INTRAVENOUS

## 2017-01-01 MED ORDER — ACETAMINOPHEN 325 MG PO TABS
650.0000 mg | ORAL_TABLET | ORAL | Status: DC | PRN
  Administered 2017-01-01: 650 mg via ORAL
  Filled 2017-01-01: qty 2

## 2017-01-01 MED ORDER — SODIUM CHLORIDE 0.9% FLUSH: 3.0000 mL | INTRAVENOUS | Status: DC | PRN

## 2017-01-01 MED ORDER — ACETAMINOPHEN 325 MG PO TABS
ORAL_TABLET | ORAL | Status: AC
  Filled 2017-01-01: qty 2

## 2017-01-01 MED ORDER — LIDOCAINE HCL 2 % IJ SOLN
INTRAMUSCULAR | Status: AC
  Filled 2017-01-01: qty 10

## 2017-01-01 MED ORDER — ESTRADIOL 0.1 MG/GM VA CREA
1.0000 | TOPICAL_CREAM | VAGINAL | Status: DC
  Filled 2017-01-01: qty 42.5

## 2017-01-01 MED ORDER — IOPAMIDOL (ISOVUE-370) INJECTION 76%
INTRAVENOUS | Status: AC
  Filled 2017-01-01: qty 50

## 2017-01-01 MED ORDER — MIDAZOLAM HCL 2 MG/2ML IJ SOLN
INTRAMUSCULAR | Status: AC
  Filled 2017-01-01: qty 2

## 2017-01-01 MED ORDER — HEPARIN SODIUM (PORCINE) 1000 UNIT/ML IJ SOLN
INTRAMUSCULAR | Status: DC | PRN
  Administered 2017-01-01: 3000 [IU] via INTRAVENOUS
  Administered 2017-01-01: 4000 [IU] via INTRAVENOUS

## 2017-01-01 MED ORDER — METOPROLOL TARTRATE 12.5 MG HALF TABLET
12.5000 mg | ORAL_TABLET | Freq: Two times a day (BID) | ORAL | Status: DC
  Administered 2017-01-01 – 2017-01-02 (×2): 12.5 mg via ORAL
  Filled 2017-01-01 (×2): qty 1

## 2017-01-01 MED ORDER — PRASUGREL HCL 10 MG PO TABS
10.0000 mg | ORAL_TABLET | Freq: Every day | ORAL | Status: DC
  Administered 2017-01-02: 10 mg via ORAL
  Filled 2017-01-01: qty 1

## 2017-01-01 MED ORDER — ONDANSETRON HCL 4 MG/2ML IJ SOLN: 4.0000 mg | Freq: Four times a day (QID) | INTRAMUSCULAR | Status: DC | PRN

## 2017-01-01 MED ORDER — HEPARIN (PORCINE) IN NACL 2-0.9 UNIT/ML-% IJ SOLN
INTRAMUSCULAR | Status: AC | PRN
  Administered 2017-01-01: 1000 mL

## 2017-01-01 MED ORDER — ASPIRIN 81 MG PO CHEW
81.0000 mg | CHEWABLE_TABLET | Freq: Every day | ORAL | Status: DC
  Administered 2017-01-02: 81 mg via ORAL
  Filled 2017-01-01: qty 1

## 2017-01-01 MED ORDER — IOPAMIDOL (ISOVUE-370) INJECTION 76%
INTRAVENOUS | Status: DC | PRN
  Administered 2017-01-01: 100 mL via INTRA_ARTERIAL

## 2017-01-01 SURGICAL SUPPLY — 19 items
BALLN SAPPHIRE 2.5X12 (BALLOONS) ×2
BALLN ~~LOC~~ EMERGE MR 3.0X6 (BALLOONS) ×2
BALLOON SAPPHIRE 2.5X12 (BALLOONS) ×1 IMPLANT
BALLOON ~~LOC~~ EMERGE MR 3.0X6 (BALLOONS) ×1 IMPLANT
CATH INFINITI 5 FR JL3.5 (CATHETERS) ×2 IMPLANT
CATH INFINITI JR4 5F (CATHETERS) ×2 IMPLANT
CATH LAUNCHER 6FR AL.75 (CATHETERS) ×2 IMPLANT
DEVICE RAD COMP TR BAND LRG (VASCULAR PRODUCTS) ×2 IMPLANT
GLIDESHEATH SLEND SS 6F .021 (SHEATH) ×2 IMPLANT
GUIDEWIRE INQWIRE 1.5J.035X260 (WIRE) ×1 IMPLANT
INQWIRE 1.5J .035X260CM (WIRE) ×2
KIT ENCORE 26 ADVANTAGE (KITS) ×2 IMPLANT
KIT HEART LEFT (KITS) ×2 IMPLANT
PACK CARDIAC CATHETERIZATION (CUSTOM PROCEDURE TRAY) ×2 IMPLANT
STENT RESOLUTE ONYX 2.75X8 (Permanent Stent) ×2 IMPLANT
TRANSDUCER W/STOPCOCK (MISCELLANEOUS) ×2 IMPLANT
TUBING CIL FLEX 10 FLL-RA (TUBING) ×2 IMPLANT
WIRE ASAHI PROWATER 180CM (WIRE) ×2 IMPLANT
WIRE HI TORQ VERSACORE-J 145CM (WIRE) ×2 IMPLANT

## 2017-01-01 NOTE — H&P (View-Only) (Signed)
Cardiology Office Note:    Date:  12/31/2016   ID:  Julia Logan, DOB 22-Jun-1945, MRN [id number]  PCP:  Nicoletta Dress, MD  Cardiologist:  Jenean Lindau, MD   Referring MD: Nicoletta Dress, MD    ASSESSMENT:    1. Chest pain, unspecified type   2. SOB (shortness of breath)   3. Angina pectoris ([place])    PLAN:    In order of problems listed above:  1. Secondary prevention stressed to the patient. Importance of compliance with diet and medications stressed and she vocalized understanding.I discussed coronary angiography and left heart catheterization with the patient at extensive length. Procedure, benefits and potential risks were explained. Patient had multiple questions which were answered to the patient's satisfaction. Patient agreed and consented for the procedure. Further recommendations will be made based on the findings of the coronary angiography. In the interim. The patient has any significant symptoms he knows to go to the nearest emergency room. 2. IN VIEW OF THE FACT THAT SHE HAS HAD A NEPHRECTOMY I WOULD PREFER THAT SHE GETS ONLY CORONARY ANGIOGRAPHY AND I DO NOT SEE MUCH VALUE FOR LEFT VENTRICULOGRAPHY, UNDERSTANDING THE RISKS AND BENEFITS. 3. Her blood pressure is stable. Diet was discussed with dyslipidemia and diabetes mellitus. She was advised to keep well hydrated in preparation for the coronary angiography. Benefits and risks especially noting that she has nephrectomy were also stressed upon.   Medication Adjustments/Labs and Tests Ordered: Current medicines are reviewed at length with the patient today.  Concerns regarding medicines are outlined above.  Orders Placed This Encounter  Procedures  . DG Chest 2 View  . CBC with Differential/Platelet  . Basic metabolic panel  . Protime-INR   No orders of the defined types were placed in this encounter.    History of Present Illness:    Julia Logan is a 71 y.o. female who is being seen today for the  evaluation of chest pain on exertion at the request of Nicoletta Dress, MD. Patient is a pleasant 71 year old female. She has past medical history of coronary artery disease.She has undergone coronary stenting in the past. Now she comes to see me in this office. I have Taken care of her in my previous practice and she wants her care to be transferred.she mentions to me that upon exertion she is having chest tightness radiating to the arms and to the neck. No orthopnea or PND. This is consistently occurring with exertion and this has affected her quality of life and she has not used nitroglycerin sublingually.at the time of my evaluation she is alert awake oriented and in no distress.  Past Medical History:  Diagnosis Date  . Cervical spondylosis without myelopathy   . Colon polyps   . Diverticulitis of colon (without mention of hemorrhage)(562.11)   . Esophageal reflux   . Essential hypertension, benign   . Iron deficiency anemia, unspecified   . Osteoarthrosis, unspecified whether generalized or localized, shoulder region   . Osteoporosis   . Other and unspecified hyperlipidemia   . Restless legs syndrome (RLS)   . Type II or unspecified type diabetes mellitus without mention of complication, not stated as uncontrolled     Past Surgical History:  Procedure Laterality Date  . APPENDECTOMY  1977  . BUNIONECTOMY    . KNEE SURGERY    . VAGINAL HYSTERECTOMY  1977    Current Medications: Current Meds  Medication Sig  . alendronate (FOSAMAX) 70 MG tablet Take 70  mg by mouth every 7 (seven) days. Take with a full glass of water on an empty stomach.   Marland Kitchen amLODipine-valsartan (EXFORGE) 5-320 MG per tablet Take 1 tablet by mouth daily.    Marland Kitchen aspirin 81 MG chewable tablet CHEW AND SWALLOW 1 TABLET(81 MG) BY MOUTH DAILY  . atorvastatin (LIPITOR) 80 MG tablet Take 80 mg by mouth daily.  . Calcium Carbonate-Vit D-Min (CALCIUM 1200) 1200-1000 MG-UNIT CHEW Chew 1 tablet by mouth 2 (two) times daily.    . Cholecalciferol (VITAMIN D) 400 UNITS capsule Take 800 Units by mouth daily.    Marland Kitchen esomeprazole (NEXIUM) 40 MG capsule Take 40 mg by mouth daily before breakfast.    . Ferrous Sulfate (IRON) 325 (65 Fe) MG TABS Take 1 tablet by mouth daily.  Marland Kitchen JANUMET 50-500 MG tablet Take 1 tablet by mouth 2 (two) times daily.  Marland Kitchen lisinopril (PRINIVIL,ZESTRIL) 40 MG tablet Take 40 mg by mouth daily.   . metoprolol tartrate (LOPRESSOR) 25 MG tablet Take 12.5 mg by mouth 2 (two) times daily.  . nitroGLYCERIN (NITROSTAT) 0.4 MG SL tablet Place 0.4 mg under the tongue every 5 (five) minutes x 3 doses as needed for chest pain.  . prasugrel (EFFIENT) 10 MG TABS tablet TAKE 1 TABLET(10 MG) BY MOUTH DAILY  . venlafaxine XR (EFFEXOR-XR) 75 MG 24 hr capsule TK 1 C PO D     Allergies:   Gabapentin; Ticagrelor; and Codeine   Social History   Social History  . Marital status: Married    Spouse name: N/A  . Number of children: 2  . Years of education: N/A   Occupational History  . retired    Social History Main Topics  . Smoking status: Never Smoker  . Smokeless tobacco: Never Used  . Alcohol use No  . Drug use: No  . Sexual activity: Not on file   Other Topics Concern  . Not on file   Social History Narrative  . No narrative on file     Family History: The patient's family history includes Diabetes in her maternal grandfather. There is no history of Colon cancer.  ROS:   Please see the history of present illness.    All other systems reviewed and are negative.  EKGs/Labs/Other Studies Reviewed:    The following studies were reviewed today: I reviewed EKG and this reveals sinus rhythm and nonspecific ST-T changes. Bradycardia. Was evident.   Recent Labs: No results found for requested labs within last 8760 hours.  Recent Lipid Panel No results found for: CHOL, TRIG, HDL, CHOLHDL, VLDL, LDLCALC, LDLDIRECT  Physical Exam:    VS:  BP 132/60 (BP Location: Right Arm, Patient Position:  Sitting)   Pulse (!) 59   Ht 5\' 1"  (1.549 m)   Wt 166 lb (75.3 kg)   SpO2 96%   BMI 31.37 kg/m     Wt Readings from Last 3 Encounters:  12/31/16 166 lb (75.3 kg)  08/07/10 181 lb (82.1 kg)     GEN: Patient is in no acute distress HEENT: Normal NECK: No JVD; No carotid bruits LYMPHATICS: No lymphadenopathy CARDIAC: S1 S2 regular, 2/6 systolic murmur at the apex. RESPIRATORY:  Clear to auscultation without rales, wheezing or rhonchi  ABDOMEN: Soft, non-tender, non-distended MUSCULOSKELETAL:  No edema; No deformity  SKIN: Warm and dry NEUROLOGIC:  Alert and oriented x 3 PSYCHIATRIC:  Normal affect    Signed, Jenean Lindau, MD  12/31/2016 2:42 PM    [facility]

## 2017-01-01 NOTE — Progress Notes (Signed)
Julia Logan for cardiac cath lab was paged. Updated her with right hand is still dusky and now has petechiae, I removed 1 cc of air early, pt arrived with purplish hand post PCI and cath lab removed 1 cc of air then, hand is warm. will continue to monitor. Also pt has headache 9/10, med given and has slight nausea. Will continue to monitor.

## 2017-01-01 NOTE — Progress Notes (Signed)
    Called by short stay 2/2 to patient having petechiae to right hand below TR band with swelling. Patient also with headache. Given tylenol. Was hypertensive in lab and given IV lopressor with improvement. Discussed with Dr. Martinique, will observe overnight with DC in the am.   Signed, Reino Bellis, NP-C 01/01/2017, 4:58 PM Pager: [phone]

## 2017-01-01 NOTE — Progress Notes (Signed)
Ed completed with pt. Good reception and understanding. She understands the importance of Effient/ASA. She admits she has missed Effient in the past and we talked about ways to help with this. Will refer to [facility] however she will need to check with her insurance since she has done program in the past year. Encouraged ex and diet management.  [place] 3:03 PM 01/01/2017

## 2017-01-01 NOTE — Discharge Instructions (Signed)

## 2017-01-01 NOTE — Progress Notes (Signed)
Cath lab to transport pt to 4E via monitor, RN, and  by WC.

## 2017-01-01 NOTE — Progress Notes (Signed)
Ria Comment pa / cardiac cath lab was contacted, pt c/o of thumb pad discomfort, HA continues 7/10, nausea slight, Dr Martinique assessed, pt to be admitted.

## 2017-01-01 NOTE — Interval H&P Note (Signed)
History and Physical Interval Note:  01/01/2017 1:00 PM  Julia Logan  has presented today for surgery, with the diagnosis of cp - shortness of breath  The various methods of treatment have been discussed with the patient and family. After consideration of risks, benefits and other options for treatment, the patient has consented to  Procedure(s): LEFT HEART CATH AND CORONARY ANGIOGRAPHY (N/A) as a surgical intervention .  The patient's history has been reviewed, patient examined, no change in status, stable for surgery.  I have reviewed the patient's chart and labs.  Questions were answered to the patient's satisfaction.    Cath Lab Visit (complete for each Cath Lab visit)  Clinical Evaluation Leading to the Procedure:   ACS: No.  Non-ACS:    Anginal Classification: CCS III  Anti-ischemic medical therapy: Maximal Therapy (2 or more classes of medications)  Non-Invasive Test Results: No non-invasive testing performed  Prior CABG: No previous CABG       Lilana Blasko Martinique MD,FACC 01/01/2017 1:00 PM

## 2017-01-02 ENCOUNTER — Encounter (HOSPITAL_COMMUNITY): Payer: Self-pay | Admitting: Cardiology

## 2017-01-02 DIAGNOSIS — K219 Gastro-esophageal reflux disease without esophagitis: Secondary | ICD-10-CM | POA: Diagnosis not present

## 2017-01-02 DIAGNOSIS — T82855A Stenosis of coronary artery stent, initial encounter: Secondary | ICD-10-CM | POA: Diagnosis not present

## 2017-01-02 DIAGNOSIS — G2581 Restless legs syndrome: Secondary | ICD-10-CM | POA: Diagnosis not present

## 2017-01-02 DIAGNOSIS — Z905 Acquired absence of kidney: Secondary | ICD-10-CM | POA: Diagnosis not present

## 2017-01-02 DIAGNOSIS — M810 Age-related osteoporosis without current pathological fracture: Secondary | ICD-10-CM | POA: Diagnosis not present

## 2017-01-02 DIAGNOSIS — E785 Hyperlipidemia, unspecified: Secondary | ICD-10-CM | POA: Diagnosis not present

## 2017-01-02 DIAGNOSIS — E119 Type 2 diabetes mellitus without complications: Secondary | ICD-10-CM | POA: Diagnosis not present

## 2017-01-02 DIAGNOSIS — Z7982 Long term (current) use of aspirin: Secondary | ICD-10-CM | POA: Diagnosis not present

## 2017-01-02 DIAGNOSIS — I97630 Postprocedural hematoma of a circulatory system organ or structure following a cardiac catheterization: Secondary | ICD-10-CM | POA: Diagnosis not present

## 2017-01-02 DIAGNOSIS — I209 Angina pectoris, unspecified: Secondary | ICD-10-CM | POA: Diagnosis not present

## 2017-01-02 DIAGNOSIS — M19019 Primary osteoarthritis, unspecified shoulder: Secondary | ICD-10-CM | POA: Diagnosis not present

## 2017-01-02 DIAGNOSIS — I25119 Atherosclerotic heart disease of native coronary artery with unspecified angina pectoris: Secondary | ICD-10-CM | POA: Diagnosis not present

## 2017-01-02 DIAGNOSIS — I10 Essential (primary) hypertension: Secondary | ICD-10-CM | POA: Diagnosis not present

## 2017-01-02 LAB — CBC
HCT: 33.5 % — ABNORMAL LOW (ref 36.0–46.0)
Hemoglobin: 10.7 g/dL — ABNORMAL LOW (ref 12.0–15.0)
MCH: 27.2 pg (ref 26.0–34.0)
MCHC: 31.9 g/dL (ref 30.0–36.0)
MCV: 85.2 fL (ref 78.0–100.0)
PLATELETS: 179 10*3/uL (ref 150–400)
RBC: 3.93 MIL/uL (ref 3.87–5.11)
RDW: 13.2 % (ref 11.5–15.5)
WBC: 3.9 10*3/uL — ABNORMAL LOW (ref 4.0–10.5)

## 2017-01-02 LAB — BASIC METABOLIC PANEL
Anion gap: 7 (ref 5–15)
BUN: 19 mg/dL (ref 6–20)
CHLORIDE: 109 mmol/L (ref 101–111)
CO2: 24 mmol/L (ref 22–32)
CREATININE: 1.02 mg/dL — AB (ref 0.44–1.00)
Calcium: 9.2 mg/dL (ref 8.9–10.3)
GFR calc Af Amer: >60 mL/min (ref >60)
GFR calc non Af Amer: 54 mL/min — ABNORMAL LOW (ref >60)
GLUCOSE: 126 mg/dL — AB (ref 65–99)
Potassium: 3.6 mmol/L (ref 3.5–5.1)
SODIUM: 140 mmol/L (ref 135–145)

## 2017-01-02 MED ORDER — IBUPROFEN 400 MG PO TABS
400.0000 mg | ORAL_TABLET | Freq: Once | ORAL | Status: AC
  Administered 2017-01-02: 400 mg via ORAL
  Filled 2017-01-02: qty 1

## 2017-01-02 MED ORDER — NITROGLYCERIN 0.4 MG SL SUBL: 0.4000 mg | SUBLINGUAL_TABLET | SUBLINGUAL | 12 refills | Status: DC | PRN

## 2017-01-02 MED ORDER — PANTOPRAZOLE SODIUM 40 MG PO TBEC: 40.0000 mg | DELAYED_RELEASE_TABLET | Freq: Every day | ORAL | 6 refills | Status: DC

## 2017-01-02 MED FILL — Lidocaine HCl Local Inj 2%: INTRAMUSCULAR | Qty: 10 | Status: AC

## 2017-01-02 NOTE — Addendum Note (Signed)
Addended by: Mattie Marlin on: 01/02/2017 09:36 AM   Modules accepted: Orders

## 2017-01-02 NOTE — Care Management Obs Status (Signed)
[place] NOTIFICATION   Patient Details  Name: Julia Logan MRN: [id number] Date of Birth: [birth year]   Medicare Observation Status Notification Given:  Yes    Carles Collet, RN 01/02/2017, 10:24 AM

## 2017-01-02 NOTE — Progress Notes (Signed)
CARDIAC REHAB PHASE I   PRE:  Rate/Rhythm: 42 SB  BP:  Sitting: 135/61        SaO2: 97 RA  MODE:  Ambulation: 470 ft   POST:  Rate/Rhythm: 60 SR  BP:  Sitting: 176/74         SaO2: 97 RA  Pt ambulated 470 ft on RA, hand held assist, steady gait, tolerated well with no complaints other than a slight headache, for which she is receiving medication. Pt states she has no questions regarding education at this time, phase 2 referral sent to [facility] Pt to recliner after walk, call bell within reach. Pt hopeful for discharge today.   [id number] Lenna Sciara, RN, BSN 01/02/2017 11:42 AM

## 2017-01-02 NOTE — Progress Notes (Signed)
Progress Note  Patient Name: Julia Logan Date of Encounter: 01/02/2017  Primary Cardiologist: Dr. Geraldo Pitter  Subjective   Feeling well. No chest pain, sob or palpitations.   Inpatient Medications    Scheduled Meds: . aspirin  81 mg Oral Daily  . atorvastatin  80 mg Oral q1800  . calcium carbonate  500 mg of elemental calcium Oral BID WC   And  . cholecalciferol  1,000 Units Oral BID WC  . cholecalciferol  800 Units Oral Daily  . estradiol  1 Applicatorful Vaginal Once per day on Mon Wed Fri  . ferrous sulfate  325 mg Oral BID  . lisinopril  40 mg Oral Daily  . metoprolol tartrate  12.5 mg Oral BID  . pantoprazole  40 mg Oral Daily  . prasugrel  10 mg Oral Daily  . sodium chloride flush  3 mL Intravenous Q12H  . venlafaxine XR  75 mg Oral Q breakfast   Continuous Infusions: . sodium chloride     PRN Meds: sodium chloride, acetaminophen, nitroGLYCERIN, ondansetron (ZOFRAN) IV, sodium chloride flush   Vital Signs    Vitals:   01/01/17 1855 01/01/17 1906 01/01/17 2058 01/02/17 0356  BP:  (!) 143/58 (!) 148/68 (!) 148/86  Pulse: 60 64 63 63  Resp: 14  20 18   Temp: 97.9 F (36.6 C)  98.1 F (36.7 C) 98 F (36.7 C)  TempSrc: Oral  Oral Oral  SpO2: 97% 96% 97% 97%  Weight:      Height:        Intake/Output Summary (Last 24 hours) at 01/02/17 1043 Last data filed at 01/02/17 0800  Gross per 24 hour  Intake              600 ml  Output              600 ml  Net                0 ml   Filed Weights   01/01/17 1150  Weight: 160 lb (72.6 kg)    Telemetry    SR at rate of 50-60s- Personally Reviewed  ECG    Sr at rate of 55 bpm - Personally Reviewed  Physical Exam   GEN: No acute distress.   Neck: No JVD Cardiac: RRR, no murmurs, rubs, or gallops. R radial cath site with mild hematoma with petechiae.  Respiratory: Clear to auscultation bilaterally. GI: Soft, nontender, non-distended  MS: No edema; No deformity. Neuro:  Nonfocal  Psych: Normal  affect   Labs    Chemistry Recent Labs Lab 12/31/16 1458 01/02/17 0400  NA 141 140  K 4.2 3.6  CL 103 109  CO2 23 24  GLUCOSE 123* 126*  BUN 24 19  CREATININE 1.10* 1.02*  CALCIUM 10.1 9.2  GFRNONAA 51* 54*  GFRAA 58* >60  ANIONGAP  --  7     Hematology Recent Labs Lab 12/31/16 1458 01/02/17 0400  WBC 5.5 3.9*  RBC 4.19 3.93  HGB 11.6 10.7*  HCT 35.7 33.5*  MCV 85 85.2  MCH 27.7 27.2  MCHC 32.5 31.9  RDW 13.8 13.2  PLT 232 179    Cardiac EnzymesNo results for input(s): TROPONINI in the last 168 hours. No results for input(s): TROPIPOC in the last 168 hours.   BNPNo results for input(s): BNP, PROBNP in the last 168 hours.   DDimer No results for input(s): DDIMER in the last 168 hours.   Radiology    Dg  Chest 2 View  Result Date: 12/31/2016 CLINICAL DATA:  Chest pain.  Shortness of breath.  Dizziness. EXAM: CHEST  2 VIEW COMPARISON:  04/18/2016 CT.  Plain film 04/18/2016. FINDINGS: Mild hyperinflation. Remote sternal fracture. Midline trachea. Mild cardiomegaly. Atherosclerosis in the transverse aorta. Prior coronary stent. No pleural effusion or pneumothorax. No congestive failure. Biapical pleural thickening. Suspect mild scarring along the left heart border on the frontal radiograph. IMPRESSION: No acute cardiopulmonary disease. Cardiomegaly without congestive failure. Aortic Atherosclerosis (ICD10-I70.0). Electronically Signed   By: Abigail Miyamoto M.D.   On: 12/31/2016 16:20    Cardiac Studies   CORONARY STENT INTERVENTION  LEFT HEART CATH AND CORONARY ANGIOGRAPHY  Conclusion     Mid RCA to Dist RCA lesion, 0 %stenosed.  Prox RCA to Mid RCA lesion, 25 %stenosed.  LV end diastolic pressure is normal.  A STENT RESOLUTE ONYX 2.75X8 drug eluting stent was successfully placed, and overlaps previously placed stent.  Dist RCA lesion, 95 %stenosed.  Post intervention, there is a 0% residual stenosis.   1. Single vessel obstructive CAD. Stenosis at  distal margin of prior stent placement. 2. Normal LVEDP 3. Successful stenting of the distal RCA with short, overlapping DES  Plan: continue DAPT for at least one year. Will plan on same day discharge.       Patient Profile     71 y.o. female with hx of CAD with remote stenting, DM, nephrectomy, HLD and HTN presents for outpatient cath.    Seen by Dr. Alverda Skeans 12/31/16 for DOE with chest tightness radiating to the arms and to the neck. No orthopnea or PND. This is consistently occurring with exertion and this has affected her quality of life and she has not used nitroglycerin sublingually.  Assessment & Plan    1. CAD - cath showed 95% stenosis at distal margin of prior stent placement in RCA s/p PTCA and overlapping DES to previously placed stent.  - Continue ASA, Effient, BB, ACE and statin. Ambulated well with cardiac rehab. Scr stable post cath.   2. Right radial hematoma and petechiae - Improved overnight. Hgb stable.  3. HLD - No results found for requested labs within last 8760 hours. Consider lipid profile as outpatient. Continue statin. LDL goal less than 70.  4. HTN - BP relatively stable on current medication  For questions or updates, please contact [place] Please consult [email or web address] for contact info under Cardiology/STEMI.      Signed, Leanor Kail, PA  01/02/2017, 10:43 AM    Patient seen, examined. Available data reviewed. Agree with findings, assessment, and plan as outlined by Robbie Lis, PA-C. Exam reveals a pleasant, alert and oriented woman, sitting up in a chair, in no distress. Heart is regular rate and rhythm. Lungs are clear. Abdomen is soft and nontender. The right radial site is clear with no hematoma. The pulses 2+. There is a petechial rash in the right hand.  All data is reviewed. The patient appears stable for discharge today. Her medications are reviewed and she is on an appropriate regimen. Continue dual antiplatelet therapy  with aspirin and Effient. I would anticipate that the rash on her right hand will resolve over the next few weeks. She is not having any pain or swelling there. She requests an updated prescription for sublingual nitroglycerin. The patient will follow up with Dr. Geraldo Pitter.   Sherren Mocha, M.D. 01/02/2017 12:17 PM

## 2017-01-02 NOTE — Discharge Summary (Signed)
Discharge Summary    Patient ID: Julia Logan,  MRN: [id number], DOB/AGE: [birth year] 71 y.o.  Admit date: 01/01/2017 Discharge date: 01/02/2017  Primary Care Provider: Nicoletta Dress Primary Cardiologist: Dr. Geraldo Pitter  Discharge Diagnoses    Principal Problem:   Angina pectoris [facility]) Active Problems:   CAD (coronary artery disease)   Diabetes mellitus ([place])   Hypertension   CAD (coronary artery disease), native coronary artery   Allergies Allergies  Allergen Reactions  . Gabapentin Swelling and Other (See Comments)    Feet and legs  . Ticagrelor Shortness Of Breath  . Codeine Other (See Comments)    hallucinations    Diagnostic Studies/Procedures    CORONARY STENT INTERVENTION  LEFT HEART CATH AND CORONARY ANGIOGRAPHY  Conclusion     Mid RCA to Dist RCA lesion, 0 %stenosed.  Prox RCA to Mid RCA lesion, 25 %stenosed.  LV end diastolic pressure is normal.  A STENT RESOLUTE ONYX 2.75X8 drug eluting stent was successfully placed, and overlaps previously placed stent.  Dist RCA lesion, 95 %stenosed.  Post intervention, there is a 0% residual stenosis.  1. Single vessel obstructive CAD. Stenosis at distal margin of prior stent placement. 2. Normal LVEDP 3. Successful stenting of the distal RCA with short, overlapping DES  Plan: continue DAPT for at least one year. Will plan on same day discharge.       History of Present Illness     71 y.o. female with hx of CAD with remote stenting, DM, nephrectomy, HLD and HTN presents for outpatient cath.    Seen by Dr. Alverda Skeans 12/31/16 for DOE with chest tightness radiating to the arms and to the neck. No orthopnea or PND. This is consistently occurring with exertion and this has affected her quality of life and she has not used nitroglycerin sublingually.  Hospital Course     Consultants: None   1. CAD - Cath showed 95% stenosis at distal margin of prior stent placement in RCA s/p PTCA and  overlapping DES to previously placed stent.  - Continue ASA, Effient, BB, ACE and statin. Ambulated well with cardiac rehab. Scr stable post cath.   2. Right radial hematoma and petechiae - Improved overnight. Hgb stable. No pain or swelling.   3. HLD - No results found for requested labs within last 8760 hours. Consider lipid profile as outpatient. Continue statin. LDL goal less than 70.  4. HTN - BP relatively stable on current medication   The patient has been seen by Dr. Burt Knack today and deemed ready for discharge home. All follow-up appointments have been scheduled. Discharge medications are listed below.  _____________   Discharge Vitals Blood pressure (!) 148/86, pulse 63, temperature 98 F (36.7 C), temperature source Oral, resp. rate 18, height 5\' 1"  (1.549 m), weight 160 lb (72.6 kg), SpO2 97 %.  Filed Weights   01/01/17 1150  Weight: 160 lb (72.6 kg)    Labs & Radiologic Studies     CBC  Recent Labs  12/31/16 1458 01/02/17 0400  WBC 5.5 3.9*  NEUTROABS 3.0  --   HGB 11.6 10.7*  HCT 35.7 33.5*  MCV 85 85.2  PLT 232 384   Basic Metabolic Panel  Recent Labs  12/31/16 1458 01/02/17 0400  NA 141 140  K 4.2 3.6  CL 103 109  CO2 23 24  GLUCOSE 123* 126*  BUN 24 19  CREATININE 1.10* 1.02*  CALCIUM 10.1 9.2    Dg Chest 2 View  Result Date: 12/31/2016 CLINICAL DATA:  Chest pain.  Shortness of breath.  Dizziness. EXAM: CHEST  2 VIEW COMPARISON:  04/18/2016 CT.  Plain film 04/18/2016. FINDINGS: Mild hyperinflation. Remote sternal fracture. Midline trachea. Mild cardiomegaly. Atherosclerosis in the transverse aorta. Prior coronary stent. No pleural effusion or pneumothorax. No congestive failure. Biapical pleural thickening. Suspect mild scarring along the left heart border on the frontal radiograph. IMPRESSION: No acute cardiopulmonary disease. Cardiomegaly without congestive failure. Aortic Atherosclerosis (ICD10-I70.0). Electronically Signed   By: Abigail Miyamoto M.D.   On: 12/31/2016 16:20    Disposition   Pt is being discharged home today in good condition.  Follow-up Plans & Appointments    Follow-up Information    Revankar, Reita Cliche, MD Follow up on 01/14/2017.   Specialty:  Cardiology Why:  at 2:20pm for your follow up appt.  Contact information: [address] [place] [address] [phone]          Discharge Instructions    Amb Referral to Cardiac Rehabilitation    Complete by:  As directed    To [place]   Diagnosis:   Coronary Stents PTCA     Diet - low sodium heart healthy    Complete by:  As directed    Discharge instructions    Complete by:  As directed    No driving for 48. No lifting over 5 lbs for 1 week. No sexual activity for 1 week.Keep procedure site clean & dry. If you notice increased pain, swelling, bleeding or pus, call/return!  You may shower, but no soaking baths/hot tubs/pools for 1 week.   Some studies suggest Prilosec/Omeprazole interacts with Plavix/Effient. We changed your Prilosec/Omeprazole to Protonix for less chance of interaction.   Increase activity slowly    Complete by:  As directed       Discharge Medications   Current Discharge Medication List    START taking these medications   Details  pantoprazole (PROTONIX) 40 MG tablet Take 1 tablet (40 mg total) by mouth daily. Qty: 30 tablet, Refills: 6      CONTINUE these medications which have CHANGED   Details  nitroGLYCERIN (NITROSTAT) 0.4 MG SL tablet Place 1 tablet (0.4 mg total) under the tongue every 5 (five) minutes as needed for chest pain. Qty: 25 tablet, Refills: 12      CONTINUE these medications which have NOT CHANGED   Details  alendronate (FOSAMAX) 70 MG tablet Take 70 mg by mouth every Saturday. Take with a full glass of water on an empty stomach.     aspirin 81 MG chewable tablet Chew and swallow 81 mg by mouth once daily    atorvastatin (LIPITOR) 80 MG tablet Take 80 mg by mouth  daily. Refills: 1    Calcium Carbonate-Vit D-Min (CALCIUM 1200) 1200-1000 MG-UNIT CHEW Chew 1 tablet by mouth 2 (two) times daily.     Cholecalciferol (VITAMIN D) 400 UNITS capsule Take 800 Units by mouth daily.      estradiol (ESTRACE) 0.1 MG/GM vaginal cream Place 1 Applicatorful vaginally 3 (three) times a week.    Ferrous Sulfate (IRON) 325 (65 Fe) MG TABS Take 325 mg by mouth 2 (two) times daily.     JANUMET 50-500 MG tablet Take 1 tablet by mouth 2 (two) times daily. Refills: 12    lisinopril (PRINIVIL,ZESTRIL) 40 MG tablet Take 40 mg by mouth daily.     metoprolol tartrate (LOPRESSOR) 25 MG tablet Take 12.5 mg by mouth 2 (two)  times daily.    prasugrel (EFFIENT) 10 MG TABS tablet Take 10 mg by mouth daily    Probiotic Product (PROBIOTIC PO) Take 1 capsule by mouth daily.    venlafaxine XR (EFFEXOR-XR) 75 MG 24 hr capsule Take 75 mg by mouth daily Refills: 5      STOP taking these medications     esomeprazole (NEXIUM) 40 MG capsule           Outstanding Labs/Studies   None  Duration of Discharge Encounter   Greater than 30 minutes including physician time.  Signed, Eliza Green PA-C 01/02/2017, 1:01 PM

## 2017-01-14 ENCOUNTER — Ambulatory Visit (INDEPENDENT_AMBULATORY_CARE_PROVIDER_SITE_OTHER): Payer: PPO | Admitting: Cardiology

## 2017-01-14 ENCOUNTER — Encounter: Payer: Self-pay | Admitting: Cardiology

## 2017-01-14 VITALS — BP 140/70 | HR 67 | Ht 61.0 in | Wt 165.1 lb

## 2017-01-14 DIAGNOSIS — I2510 Atherosclerotic heart disease of native coronary artery without angina pectoris: Secondary | ICD-10-CM | POA: Diagnosis not present

## 2017-01-14 DIAGNOSIS — E0800 Diabetes mellitus due to underlying condition with hyperosmolarity without nonketotic hyperglycemic-hyperosmolar coma (NKHHC): Secondary | ICD-10-CM | POA: Diagnosis not present

## 2017-01-14 DIAGNOSIS — I10 Essential (primary) hypertension: Secondary | ICD-10-CM

## 2017-01-14 DIAGNOSIS — Z905 Acquired absence of kidney: Secondary | ICD-10-CM | POA: Diagnosis not present

## 2017-01-14 NOTE — Addendum Note (Signed)
Addended by: Mattie Marlin on: 01/14/2017 03:14 PM   Modules accepted: Orders

## 2017-01-14 NOTE — Progress Notes (Signed)
Cardiology Office Note:    Date:  01/14/2017   ID:  LUDY MESSAMORE, DOB 11/08/1945, MRN [id number]  PCP:  Nicoletta Dress, MD  Cardiologist:  Jenean Lindau, MD   Referring MD: Nicoletta Dress, MD    ASSESSMENT:    1. Coronary artery disease involving native coronary artery of native heart without angina pectoris   2. Essential hypertension   3. History of nephrectomy   4. Diabetes mellitus due to underlying condition with hyperosmolarity without coma, without long-term current use of insulin (HCC)    PLAN:    In order of problems listed above:  1. Secondary prevention stressed to the patient. Importance of compliance with diet and medications stressed and she verbalized understanding. Her proximal and mid coronary artery the right coronary artery was unremarkable. She had a stent to the distal RCA. 2.  Importance of compliance with diet and medications stressed and she localized understanding. Her lipids will be checked by her primary care physician next month and she will for those reports to me. 3. She will have a Chem-7 today. I want to check her renal function and she has undergone coronary angiography and has history of nephrectomy. 4. Patient will be seen in follow-up appointment in 3 months or earlier if the patient has any concerns.    Medication Adjustments/Labs and Tests Ordered: Current medicines are reviewed at length with the patient today.  Concerns regarding medicines are outlined above.  No orders of the defined types were placed in this encounter.  No orders of the defined types were placed in this encounter.    Chief Complaint  Patient presents with  . Follow-up    follow up cath, No more chest pain.      History of Present Illness:    MARABETH MELLAND is a 71 y.o. female . Patient has coronary artery disease and has undergone coronary stenting. She mentioned to me that she had angina on exertion and sent for repeat coronary angiography and she had  significant stenosis in the same right coronary artery and underwent stenting. Subsequently she's done much better and has started walking on a regular basis. At the time of my evaluation she is alert awake oriented and in no distress.  Past Medical History:  Diagnosis Date  . Cervical spondylosis without myelopathy   . Colon polyps   . Diverticulitis of colon (without mention of hemorrhage)(562.11)   . Esophageal reflux   . Essential hypertension, benign   . Iron deficiency anemia, unspecified   . Osteoarthrosis, unspecified whether generalized or localized, shoulder region   . Osteoporosis   . Other and unspecified hyperlipidemia   . Restless legs syndrome (RLS)   . Type II or unspecified type diabetes mellitus without mention of complication, not stated as uncontrolled     Past Surgical History:  Procedure Laterality Date  . APPENDECTOMY  1977  . BUNIONECTOMY    . CORONARY STENT INTERVENTION N/A 01/01/2017   Procedure: CORONARY STENT INTERVENTION;  Surgeon: Martinique, Peter M, MD;  Location: [place] [facility];  Service: Cardiovascular;  Laterality: N/A;  . KNEE SURGERY    . LEFT HEART CATH AND CORONARY ANGIOGRAPHY N/A 01/01/2017   Procedure: LEFT HEART CATH AND CORONARY ANGIOGRAPHY;  Surgeon: Martinique, Peter M, MD;  Location: [place] [facility];  Service: Cardiovascular;  Laterality: N/A;  . VAGINAL HYSTERECTOMY  1977    Current Medications: Current Meds  Medication Sig  . alendronate (FOSAMAX) 70 MG tablet Take 70 mg  by mouth every Saturday. Take with a full glass of water on an empty stomach.   Marland Kitchen aspirin 81 MG chewable tablet Chew and swallow 81 mg by mouth once daily  . atorvastatin (LIPITOR) 80 MG tablet Take 80 mg by mouth daily.  . Calcium Carbonate-Vit D-Min (CALCIUM 1200) 1200-1000 MG-UNIT CHEW Chew 1 tablet by mouth 2 (two) times daily.   . Cholecalciferol (VITAMIN D) 400 UNITS capsule Take 800 Units by mouth daily.    Marland Kitchen estradiol (ESTRACE) 0.1 MG/GM vaginal cream Place 1  Applicatorful vaginally 3 (three) times a week.  . Ferrous Sulfate (IRON) 325 (65 Fe) MG TABS Take 325 mg by mouth 2 (two) times daily.   Marland Kitchen JANUMET 50-500 MG tablet Take 1 tablet by mouth 2 (two) times daily.  Marland Kitchen lisinopril (PRINIVIL,ZESTRIL) 40 MG tablet Take 40 mg by mouth daily.   . metoprolol tartrate (LOPRESSOR) 25 MG tablet Take 12.5 mg by mouth 2 (two) times daily.  . nitroGLYCERIN (NITROSTAT) 0.4 MG SL tablet Place 1 tablet (0.4 mg total) under the tongue every 5 (five) minutes as needed for chest pain.  . pantoprazole (PROTONIX) 40 MG tablet Take 1 tablet (40 mg total) by mouth daily.  . prasugrel (EFFIENT) 10 MG TABS tablet Take 10 mg by mouth daily  . Probiotic Product (PROBIOTIC PO) Take 1 capsule by mouth daily.  Marland Kitchen venlafaxine XR (EFFEXOR-XR) 75 MG 24 hr capsule Take 75 mg by mouth daily     Allergies:   Gabapentin; Ticagrelor; and Codeine   Social History   Social History  . Marital status: Married    Spouse name: N/A  . Number of children: 2  . Years of education: N/A   Occupational History  . retired    Social History Main Topics  . Smoking status: Never Smoker  . Smokeless tobacco: Never Used  . Alcohol use No  . Drug use: No  . Sexual activity: Not Asked   Other Topics Concern  . None   Social History Narrative  . None     Family History: The patient's family history includes Diabetes in her maternal grandfather. There is no history of Colon cancer.  ROS:   Please see the history of present illness.    All other systems reviewed and are negative.  EKGs/Labs/Other Studies Reviewed:    The following studies were reviewed today: I discussed my findings with the patient at extensive length. Coronary angiography report was discussed.   Recent Labs: 01/02/2017: BUN 19; Creatinine, Ser 1.02; Hemoglobin 10.7; Platelets 179; Potassium 3.6; Sodium 140  Recent Lipid Panel No results found for: CHOL, TRIG, HDL, CHOLHDL, VLDL, LDLCALC, LDLDIRECT  Physical  Exam:    VS:  BP 140/70   Pulse 67   Ht 5\' 1"  (1.549 m)   Wt 165 lb 1.9 oz (74.9 kg)   SpO2 94%   BMI 31.20 kg/m     Wt Readings from Last 3 Encounters:  01/14/17 165 lb 1.9 oz (74.9 kg)  01/01/17 160 lb (72.6 kg)  12/31/16 166 lb (75.3 kg)     GEN: Patient is in no acute distress HEENT: Normal NECK: No JVD; No carotid bruits LYMPHATICS: No lymphadenopathy CARDIAC: Hear sounds regular, 2/6 systolic murmur at the apex. RESPIRATORY:  Clear to auscultation without rales, wheezing or rhonchi  ABDOMEN: Soft, non-tender, non-distended MUSCULOSKELETAL:  No edema; No deformity  SKIN: Warm and dry NEUROLOGIC:  Alert and oriented x 3 PSYCHIATRIC:  Normal affect   Signed, Jenean Lindau, MD  01/14/2017 3:08 PM    [facility]

## 2017-01-14 NOTE — Patient Instructions (Addendum)
Medication Instructions:  Your physician recommends that you continue on your current medications as directed. Please refer to the Current Medication list given to you today.   Labwork: BMP today  Testing/Procedures: None  Follow-Up: Your physician recommends that you schedule a follow-up appointment in: 3 months   Any Other Special Instructions Will Be Listed Below (If Applicable).  81 mg asa sample given   If you need a refill on your cardiac medications before your next appointment, please call your pharmacy.   [place], RN, BSN

## 2017-01-15 ENCOUNTER — Other Ambulatory Visit: Payer: Self-pay

## 2017-01-15 DIAGNOSIS — Z905 Acquired absence of kidney: Secondary | ICD-10-CM

## 2017-01-15 LAB — BASIC METABOLIC PANEL
BUN/Creatinine Ratio: 19 (ref 12–28)
BUN: 24 mg/dL (ref 8–27)
CALCIUM: 10.9 mg/dL — AB (ref 8.7–10.3)
CHLORIDE: 105 mmol/L (ref 96–106)
CO2: 24 mmol/L (ref 20–29)
Creatinine, Ser: 1.26 mg/dL — ABNORMAL HIGH (ref 0.57–1.00)
GFR calc Af Amer: 50 mL/min/{1.73_m2} — ABNORMAL LOW (ref >59)
GFR calc non Af Amer: 43 mL/min/{1.73_m2} — ABNORMAL LOW (ref >59)
Glucose: 104 mg/dL — ABNORMAL HIGH (ref 65–99)
POTASSIUM: 4.6 mmol/L (ref 3.5–5.2)
Sodium: 144 mmol/L (ref 134–144)

## 2017-01-15 NOTE — Progress Notes (Signed)
Bmp

## 2017-01-22 ENCOUNTER — Other Ambulatory Visit: Payer: Self-pay

## 2017-01-22 DIAGNOSIS — Z905 Acquired absence of kidney: Secondary | ICD-10-CM

## 2017-01-23 ENCOUNTER — Other Ambulatory Visit: Payer: Self-pay

## 2017-01-23 DIAGNOSIS — I2510 Atherosclerotic heart disease of native coronary artery without angina pectoris: Secondary | ICD-10-CM

## 2017-01-23 LAB — BASIC METABOLIC PANEL
BUN / CREAT RATIO: 22 (ref 12–28)
BUN: 25 mg/dL (ref 8–27)
CALCIUM: 10.9 mg/dL — AB (ref 8.7–10.3)
CHLORIDE: 101 mmol/L (ref 96–106)
CO2: 25 mmol/L (ref 20–29)
Creatinine, Ser: 1.14 mg/dL — ABNORMAL HIGH (ref 0.57–1.00)
GFR calc Af Amer: 56 mL/min/{1.73_m2} — ABNORMAL LOW (ref >59)
GFR calc non Af Amer: 48 mL/min/{1.73_m2} — ABNORMAL LOW (ref >59)
GLUCOSE: 121 mg/dL — AB (ref 65–99)
Potassium: 4.1 mmol/L (ref 3.5–5.2)
Sodium: 140 mmol/L (ref 134–144)

## 2017-01-26 ENCOUNTER — Telehealth: Payer: Self-pay | Admitting: Cardiology

## 2017-01-26 ENCOUNTER — Other Ambulatory Visit: Payer: Self-pay

## 2017-01-26 MED ORDER — PRASUGREL HCL 10 MG PO TABS: ORAL_TABLET | ORAL | 3 refills | Status: DC

## 2017-01-26 NOTE — Telephone Encounter (Signed)
Spoke with patient regarding her med refill. Informed the patient that the effient was sent; however, we did not prescribe the protonix thus she would have to request this from her PCP.

## 2017-01-26 NOTE — Telephone Encounter (Signed)
Call effient and protonix to [facility]

## 2017-02-02 DIAGNOSIS — E785 Hyperlipidemia, unspecified: Secondary | ICD-10-CM | POA: Diagnosis not present

## 2017-02-02 DIAGNOSIS — F329 Major depressive disorder, single episode, unspecified: Secondary | ICD-10-CM | POA: Diagnosis not present

## 2017-02-02 DIAGNOSIS — Z6832 Body mass index (BMI) 32.0-32.9, adult: Secondary | ICD-10-CM | POA: Diagnosis not present

## 2017-02-02 DIAGNOSIS — D509 Iron deficiency anemia, unspecified: Secondary | ICD-10-CM | POA: Diagnosis not present

## 2017-02-02 DIAGNOSIS — K219 Gastro-esophageal reflux disease without esophagitis: Secondary | ICD-10-CM | POA: Diagnosis not present

## 2017-02-02 DIAGNOSIS — E1129 Type 2 diabetes mellitus with other diabetic kidney complication: Secondary | ICD-10-CM | POA: Diagnosis not present

## 2017-02-02 DIAGNOSIS — I2510 Atherosclerotic heart disease of native coronary artery without angina pectoris: Secondary | ICD-10-CM | POA: Diagnosis not present

## 2017-02-03 ENCOUNTER — Telehealth: Payer: Self-pay

## 2017-02-03 NOTE — Telephone Encounter (Signed)
Left voicemail for the patient to call the office regarding setting establishing patient appointment with new practice.

## 2017-05-05 DIAGNOSIS — Z87440 Personal history of urinary (tract) infections: Secondary | ICD-10-CM | POA: Insufficient documentation

## 2017-05-05 DIAGNOSIS — E785 Hyperlipidemia, unspecified: Secondary | ICD-10-CM | POA: Insufficient documentation

## 2017-05-05 DIAGNOSIS — H9190 Unspecified hearing loss, unspecified ear: Secondary | ICD-10-CM

## 2017-05-05 DIAGNOSIS — D649 Anemia, unspecified: Secondary | ICD-10-CM | POA: Insufficient documentation

## 2017-05-05 DIAGNOSIS — M5431 Sciatica, right side: Secondary | ICD-10-CM

## 2017-05-05 DIAGNOSIS — K219 Gastro-esophageal reflux disease without esophagitis: Secondary | ICD-10-CM | POA: Insufficient documentation

## 2017-05-05 DIAGNOSIS — G8929 Other chronic pain: Secondary | ICD-10-CM

## 2017-05-05 DIAGNOSIS — Z79899 Other long term (current) drug therapy: Secondary | ICD-10-CM

## 2017-05-05 HISTORY — DX: Unspecified hearing loss, unspecified ear: H91.90

## 2017-05-05 HISTORY — DX: Personal history of urinary (tract) infections: Z87.440

## 2017-05-05 HISTORY — DX: Sciatica, right side: M54.31

## 2017-05-05 HISTORY — DX: Other chronic pain: G89.29

## 2017-05-05 HISTORY — DX: Anemia, unspecified: Z79.899

## 2017-05-05 HISTORY — DX: Gastro-esophageal reflux disease without esophagitis: K21.9

## 2017-05-05 HISTORY — DX: Anemia, unspecified: D64.9

## 2017-05-05 HISTORY — DX: Hyperlipidemia, unspecified: E78.5

## 2017-05-06 DIAGNOSIS — D509 Iron deficiency anemia, unspecified: Secondary | ICD-10-CM | POA: Diagnosis not present

## 2017-05-06 DIAGNOSIS — F329 Major depressive disorder, single episode, unspecified: Secondary | ICD-10-CM | POA: Diagnosis not present

## 2017-05-06 DIAGNOSIS — E785 Hyperlipidemia, unspecified: Secondary | ICD-10-CM | POA: Diagnosis not present

## 2017-05-06 DIAGNOSIS — I2510 Atherosclerotic heart disease of native coronary artery without angina pectoris: Secondary | ICD-10-CM | POA: Diagnosis not present

## 2017-05-06 DIAGNOSIS — Z6832 Body mass index (BMI) 32.0-32.9, adult: Secondary | ICD-10-CM | POA: Diagnosis not present

## 2017-05-06 DIAGNOSIS — E1129 Type 2 diabetes mellitus with other diabetic kidney complication: Secondary | ICD-10-CM | POA: Diagnosis not present

## 2017-05-11 ENCOUNTER — Ambulatory Visit (INDEPENDENT_AMBULATORY_CARE_PROVIDER_SITE_OTHER): Payer: PPO | Admitting: Cardiology

## 2017-05-11 ENCOUNTER — Other Ambulatory Visit: Payer: Self-pay

## 2017-05-11 ENCOUNTER — Encounter: Payer: Self-pay | Admitting: Cardiology

## 2017-05-11 VITALS — BP 124/70 | HR 61 | Ht 61.0 in | Wt 170.0 lb

## 2017-05-11 DIAGNOSIS — I10 Essential (primary) hypertension: Secondary | ICD-10-CM

## 2017-05-11 DIAGNOSIS — Z905 Acquired absence of kidney: Secondary | ICD-10-CM

## 2017-05-11 DIAGNOSIS — I2510 Atherosclerotic heart disease of native coronary artery without angina pectoris: Secondary | ICD-10-CM

## 2017-05-11 DIAGNOSIS — I208 Other forms of angina pectoris: Secondary | ICD-10-CM

## 2017-05-11 DIAGNOSIS — I2089 Other forms of angina pectoris: Secondary | ICD-10-CM

## 2017-05-11 DIAGNOSIS — E782 Mixed hyperlipidemia: Secondary | ICD-10-CM

## 2017-05-11 HISTORY — DX: Other forms of angina pectoris: I20.89

## 2017-05-11 MED ORDER — RANOLAZINE ER 500 MG PO TB12: 500.0000 mg | ORAL_TABLET | Freq: Two times a day (BID) | ORAL | 1 refills | Status: DC

## 2017-05-11 NOTE — Patient Instructions (Signed)
Medication Instructions:  Your physician has recommended you make the following change in your medication:  START ranexa 500 mg twice daily  Labwork: None  Testing/Procedures: None  Follow-Up: Your physician recommends that you schedule a follow-up appointment in: 1 month  Any Other Special Instructions Will Be Listed Below (If Applicable).     If you need a refill on your cardiac medications before your next appointment, please call your pharmacy.   [place], RN, BSN

## 2017-05-11 NOTE — Progress Notes (Signed)
Cardiology Office Note:    Date:  05/11/2017   ID:  Julia Logan, DOB 1946-01-12, MRN [id number]  PCP:  Nicoletta Dress, MD  Cardiologist:  Jenean Lindau, MD   Referring MD: Nicoletta Dress, MD    ASSESSMENT:    1. Coronary artery disease involving native coronary artery of native heart without angina pectoris   2. Essential hypertension   3. History of nephrectomy   4. Mixed hyperlipidemia   5. Stable angina pectoris ([place])    PLAN:    In order of problems listed above:  1. Secondary prevention stressed with the patient.  Importance of compliance with diet and medications stressed and she vocalized understanding. 2. Her symptoms are suggestive of stable angina pectoris.  I gave her options of invasive and noninvasive evaluation.  She is vehemently against it at this time and wants to pursue aggressive medical therapy and I respect her wishes especially in view of the fact that she has abnormal kidney function in a solitary kidney. 3. I have initiated her on a ranolazine 500 mg twice a day.  She will continue her regular walking.  She will be seen in follow-up appointment in a month or earlier if she has any concerns.  She knows to go to the nearest emergency room for any significant concerns.   Medication Adjustments/Labs and Tests Ordered: Current medicines are reviewed at length with the patient today.  Concerns regarding medicines are outlined above.  No orders of the defined types were placed in this encounter.  Meds ordered this encounter  Medications  . ranolazine (RANEXA) 500 MG 12 hr tablet    Sig: Take 1 tablet (500 mg total) by mouth 2 (two) times daily.    Dispense:  180 tablet    Refill:  1     Chief Complaint  Patient presents with  . Follow-up  . Coronary Artery Disease     History of Present Illness:    Julia Logan is a 72 y.o. female.  Patient has known coronary artery disease.  She denies any problems at this time and takes care of  activities of daily living.  No chest pain orthopnea or PND.  She mentions to me that if she walks more than 15 minutes she feels a little tight in the chest.  No orthopnea or PND.  She mentions to me that the symptoms go to the neck and to the arm at times.  At the time of my evaluation, the patient is alert awake oriented and in no distress.  Past Medical History:  Diagnosis Date  . Cervical spondylosis without myelopathy   . Colon polyps   . Diverticulitis of colon (without mention of hemorrhage)(562.11)   . Esophageal reflux   . Essential hypertension, benign   . Iron deficiency anemia, unspecified   . Osteoarthrosis, unspecified whether generalized or localized, shoulder region   . Osteoporosis   . Other and unspecified hyperlipidemia   . Restless legs syndrome (RLS)   . Type II or unspecified type diabetes mellitus without mention of complication, not stated as uncontrolled     Past Surgical History:  Procedure Laterality Date  . APPENDECTOMY  1977  . BUNIONECTOMY    . CORONARY STENT INTERVENTION N/A 01/01/2017   Procedure: CORONARY STENT INTERVENTION;  Surgeon: Martinique, Peter M, MD;  Location: [facility];  Service: Cardiovascular;  Laterality: N/A;  . KNEE SURGERY    . LEFT HEART CATH AND CORONARY ANGIOGRAPHY N/A 01/01/2017  Procedure: LEFT HEART CATH AND CORONARY ANGIOGRAPHY;  Surgeon: Martinique, Peter M, MD;  Location: [facility];  Service: Cardiovascular;  Laterality: N/A;  . VAGINAL HYSTERECTOMY  1977    Current Medications: Current Meds  Medication Sig  . alendronate (FOSAMAX) 70 MG tablet Take 70 mg by mouth every Saturday. Take with a full glass of water on an empty stomach.   Marland Kitchen aspirin 81 MG chewable tablet Chew and swallow 81 mg by mouth once daily  . atorvastatin (LIPITOR) 40 MG tablet   . atorvastatin (LIPITOR) 80 MG tablet Take 80 mg by mouth daily.  . Calcium Carbonate-Vit D-Min (CALCIUM 1200) 1200-1000 MG-UNIT CHEW Chew 1 tablet by mouth 2 (two)  times daily.   . Calcium-Vitamin D 600-200 MG-UNIT tablet Take by mouth.  . Cholecalciferol (VITAMIN D) 400 UNITS capsule Take 800 Units by mouth daily.    Marland Kitchen estradiol (ESTRACE) 0.1 MG/GM vaginal cream Place 1 Applicatorful vaginally 3 (three) times a week.  . ezetimibe (ZETIA) 10 MG tablet TK 1 T PO D  . Ferrous Sulfate (IRON) 325 (65 Fe) MG TABS Take 325 mg by mouth 2 (two) times daily.   Marland Kitchen JANUMET 50-500 MG tablet Take 1 tablet by mouth 2 (two) times daily.  Marland Kitchen lisinopril (PRINIVIL,ZESTRIL) 40 MG tablet Take 40 mg by mouth daily.   . metoprolol tartrate (LOPRESSOR) 25 MG tablet Take 12.5 mg by mouth 2 (two) times daily.  . nitroGLYCERIN (NITROSTAT) 0.4 MG SL tablet Place 1 tablet (0.4 mg total) under the tongue every 5 (five) minutes as needed for chest pain.  . pantoprazole (PROTONIX) 40 MG tablet Take 1 tablet (40 mg total) by mouth daily.  . prasugrel (EFFIENT) 10 MG TABS tablet Take 10 mg by mouth daily  . Probiotic Product (PROBIOTIC PO) Take 1 capsule by mouth daily.  Marland Kitchen venlafaxine XR (EFFEXOR-XR) 75 MG 24 hr capsule Take 75 mg by mouth daily     Allergies:   Gabapentin; Ticagrelor; and Codeine   Social History   Socioeconomic History  . Marital status: Married    Spouse name: None  . Number of children: 2  . Years of education: None  . Highest education level: None  Social Needs  . Financial resource strain: None  . Food insecurity - worry: None  . Food insecurity - inability: None  . Transportation needs - medical: None  . Transportation needs - non-medical: None  Occupational History  . Occupation: retired  Tobacco Use  . Smoking status: Never Smoker  . Smokeless tobacco: Never Used  Substance and Sexual Activity  . Alcohol use: No  . Drug use: No  . Sexual activity: None  Other Topics Concern  . None  Social History Narrative  . None     Family History: The patient's family history includes Diabetes in her maternal grandfather. There is no history of Colon  cancer.  ROS:   Please see the history of present illness.    All other systems reviewed and are negative.  EKGs/Labs/Other Studies Reviewed:    The following studies were reviewed today: I discussed my findings with the patient at extensive length.  Lab work which was done last time from primary care physician's office was evaluated.   Recent Labs: 01/02/2017: Hemoglobin 10.7; Platelets 179 01/22/2017: BUN 25; Creatinine, Ser 1.14; Potassium 4.1; Sodium 140  Recent Lipid Panel No results found for: CHOL, TRIG, HDL, CHOLHDL, VLDL, LDLCALC, LDLDIRECT  Physical Exam:    VS:  BP 124/70 (BP Location:  Right Arm, Patient Position: Sitting, Cuff Size: Normal)   Pulse 61   Ht 5\' 1"  (1.549 m)   Wt 170 lb (77.1 kg)   SpO2 98%   BMI 32.12 kg/m     Wt Readings from Last 3 Encounters:  05/11/17 170 lb (77.1 kg)  01/14/17 165 lb 1.9 oz (74.9 kg)  01/01/17 160 lb (72.6 kg)     GEN: Patient is in no acute distress HEENT: Normal NECK: No JVD; No carotid bruits LYMPHATICS: No lymphadenopathy CARDIAC: Hear sounds regular, 2/6 systolic murmur at the apex. RESPIRATORY:  Clear to auscultation without rales, wheezing or rhonchi  ABDOMEN: Soft, non-tender, non-distended MUSCULOSKELETAL:  No edema; No deformity  SKIN: Warm and dry NEUROLOGIC:  Alert and oriented x 3 PSYCHIATRIC:  Normal affect   Signed, Jenean Lindau, MD  05/11/2017 3:59 PM    [facility]

## 2017-05-20 DIAGNOSIS — R040 Epistaxis: Secondary | ICD-10-CM | POA: Diagnosis not present

## 2017-05-20 DIAGNOSIS — Z7901 Long term (current) use of anticoagulants: Secondary | ICD-10-CM | POA: Diagnosis not present

## 2017-05-20 DIAGNOSIS — Z7982 Long term (current) use of aspirin: Secondary | ICD-10-CM | POA: Diagnosis not present

## 2017-05-25 DIAGNOSIS — R040 Epistaxis: Secondary | ICD-10-CM | POA: Diagnosis not present

## 2017-06-08 DIAGNOSIS — R040 Epistaxis: Secondary | ICD-10-CM | POA: Diagnosis not present

## 2017-06-16 ENCOUNTER — Encounter: Payer: Self-pay | Admitting: Cardiology

## 2017-06-16 ENCOUNTER — Other Ambulatory Visit: Payer: Self-pay | Admitting: Cardiology

## 2017-06-16 ENCOUNTER — Ambulatory Visit (INDEPENDENT_AMBULATORY_CARE_PROVIDER_SITE_OTHER): Payer: PPO | Admitting: Cardiology

## 2017-06-16 VITALS — BP 132/76 | HR 60 | Ht 61.0 in | Wt 170.4 lb

## 2017-06-16 DIAGNOSIS — I2510 Atherosclerotic heart disease of native coronary artery without angina pectoris: Secondary | ICD-10-CM | POA: Diagnosis not present

## 2017-06-16 DIAGNOSIS — I208 Other forms of angina pectoris: Secondary | ICD-10-CM

## 2017-06-16 DIAGNOSIS — Z79899 Other long term (current) drug therapy: Secondary | ICD-10-CM

## 2017-06-16 DIAGNOSIS — Z905 Acquired absence of kidney: Secondary | ICD-10-CM

## 2017-06-16 DIAGNOSIS — I10 Essential (primary) hypertension: Secondary | ICD-10-CM | POA: Diagnosis not present

## 2017-06-16 DIAGNOSIS — E0800 Diabetes mellitus due to underlying condition with hyperosmolarity without nonketotic hyperglycemic-hyperosmolar coma (NKHHC): Secondary | ICD-10-CM | POA: Diagnosis not present

## 2017-06-16 MED ORDER — RANOLAZINE ER 1000 MG PO TB12: 1000.0000 mg | ORAL_TABLET | Freq: Two times a day (BID) | ORAL | 3 refills | Status: DC

## 2017-06-16 NOTE — Progress Notes (Signed)
Cardiology Office Note:    Date:  06/16/2017   ID:  Julia Logan, DOB September 22, 1945, MRN [id number]  PCP:  Nicoletta Dress, MD  Cardiologist:  Jenean Lindau, MD   Referring MD: Nicoletta Dress, MD    ASSESSMENT:    No diagnosis found. PLAN:    In order of problems listed above:  1. Secondary prevention stressed with patient.  Importance of compliance with diet and medications stressed and she vocalized understanding.  Her blood pressure stable.  Diet was discussed for dyslipidemia and diabetes mellitus.  These issues are followed by primary care physician. 2. The patient mentions of some nausea at times.  She is on Ranexa and other medications.  I will do blood work today including liver function testing and amylase and lipase to be very sure.  These are unremarkable we will advise her to increase Ranexa to 1 g twice daily.  EKG will be done today.  She will be seen in follow-up appointment if 6 months or earlier if she has any concerns.   Medication Adjustments/Labs and Tests Ordered: Current medicines are reviewed at length with the patient today.  Concerns regarding medicines are outlined above.  No orders of the defined types were placed in this encounter.  No orders of the defined types were placed in this encounter.    Chief Complaint  Patient presents with  . Follow-up  . Coronary Artery Disease     History of Present Illness:    Julia Logan is a 72 y.o. female.  The patient has past medical history of coronary artery disease.  She denies any chest pain orthopnea or PND.  She tells me that she can consistently walk 15 minutes which she does on a daily basis at this time she has some chest tightness.  No orthopnea or PND.  At the time of my evaluation, the patient is alert awake oriented and in no distress.  Past Medical History:  Diagnosis Date  . Cervical spondylosis without myelopathy   . Colon polyps   . Diverticulitis of colon (without mention of  hemorrhage)(562.11)   . Esophageal reflux   . Essential hypertension, benign   . Iron deficiency anemia, unspecified   . Osteoarthrosis, unspecified whether generalized or localized, shoulder region   . Osteoporosis   . Other and unspecified hyperlipidemia   . Restless legs syndrome (RLS)   . Type II or unspecified type diabetes mellitus without mention of complication, not stated as uncontrolled     Past Surgical History:  Procedure Laterality Date  . APPENDECTOMY  1977  . BUNIONECTOMY    . CORONARY STENT INTERVENTION N/A 01/01/2017   Procedure: CORONARY STENT INTERVENTION;  Surgeon: Martinique, Peter M, MD;  Location: [facility];  Service: Cardiovascular;  Laterality: N/A;  . KNEE SURGERY    . LEFT HEART CATH AND CORONARY ANGIOGRAPHY N/A 01/01/2017   Procedure: LEFT HEART CATH AND CORONARY ANGIOGRAPHY;  Surgeon: Martinique, Peter M, MD;  Location: [facility];  Service: Cardiovascular;  Laterality: N/A;  . VAGINAL HYSTERECTOMY  1977    Current Medications: Current Meds  Medication Sig  . alendronate (FOSAMAX) 70 MG tablet Take 70 mg by mouth every Saturday. Take with a full glass of water on an empty stomach.   Marland Kitchen aspirin 81 MG chewable tablet Chew and swallow 81 mg by mouth once daily  . atorvastatin (LIPITOR) 80 MG tablet Take 80 mg by mouth daily.  . Calcium Carbonate-Vit D-Min (CALCIUM 1200)  1200-1000 MG-UNIT CHEW Chew 1 tablet by mouth 2 (two) times daily.   . Calcium-Vitamin D 600-200 MG-UNIT tablet Take by mouth.  . Cholecalciferol (VITAMIN D) 400 UNITS capsule Take 800 Units by mouth daily.    Marland Kitchen estradiol (ESTRACE) 0.1 MG/GM vaginal cream Place 1 Applicatorful vaginally 3 (three) times a week.  . ezetimibe (ZETIA) 10 MG tablet TK 1 T PO D  . Ferrous Sulfate (IRON) 325 (65 Fe) MG TABS Take 325 mg by mouth 2 (two) times daily.   Marland Kitchen JANUMET 50-500 MG tablet Take 1 tablet by mouth 2 (two) times daily.  Marland Kitchen lisinopril (PRINIVIL,ZESTRIL) 40 MG tablet Take 40 mg by mouth  daily.   . metoprolol tartrate (LOPRESSOR) 25 MG tablet Take 12.5 mg by mouth 2 (two) times daily.  . nitroGLYCERIN (NITROSTAT) 0.4 MG SL tablet Place 1 tablet (0.4 mg total) under the tongue every 5 (five) minutes as needed for chest pain.  . pantoprazole (PROTONIX) 40 MG tablet Take 1 tablet (40 mg total) by mouth daily.  . prasugrel (EFFIENT) 10 MG TABS tablet Take 10 mg by mouth daily  . Probiotic Product (PROBIOTIC PO) Take 1 capsule by mouth daily.  . ranolazine (RANEXA) 500 MG 12 hr tablet Take 1 tablet (500 mg total) by mouth 2 (two) times daily.  Marland Kitchen venlafaxine XR (EFFEXOR-XR) 75 MG 24 hr capsule Take 75 mg by mouth daily  . [DISCONTINUED] atorvastatin (LIPITOR) 40 MG tablet      Allergies:   Gabapentin; Ticagrelor; and Codeine   Social History   Socioeconomic History  . Marital status: Married    Spouse name: Not on file  . Number of children: 2  . Years of education: Not on file  . Highest education level: Not on file  Occupational History  . Occupation: retired  Scientific laboratory technician  . Financial resource strain: Not on file  . Food insecurity:    Worry: Not on file    Inability: Not on file  . Transportation needs:    Medical: Not on file    Non-medical: Not on file  Tobacco Use  . Smoking status: Never Smoker  . Smokeless tobacco: Never Used  Substance and Sexual Activity  . Alcohol use: No  . Drug use: No  . Sexual activity: Not on file  Lifestyle  . Physical activity:    Days per week: Not on file    Minutes per session: Not on file  . Stress: Not on file  Relationships  . Social connections:    Talks on phone: Not on file    Gets together: Not on file    Attends religious service: Not on file    Active member of club or organization: Not on file    Attends meetings of clubs or organizations: Not on file    Relationship status: Not on file  Other Topics Concern  . Not on file  Social History Narrative  . Not on file     Family History: The patient's  family history includes Diabetes in her maternal grandfather. There is no history of Colon cancer.  ROS:   Please see the history of present illness.    All other systems reviewed and are negative.  EKGs/Labs/Other Studies Reviewed:    The following studies were reviewed today: I discussed the findings of my evaluation today with the patient.  EKG reveals sinus rhythm and nonspecific ST-T changes.   Recent Labs: 01/02/2017: Hemoglobin 10.7; Platelets 179 01/22/2017: BUN 25; Creatinine, Ser 1.14; Potassium  4.1; Sodium 140  Recent Lipid Panel No results found for: CHOL, TRIG, HDL, CHOLHDL, VLDL, LDLCALC, LDLDIRECT  Physical Exam:    VS:  BP 132/76 (BP Location: Right Arm, Patient Position: Sitting, Cuff Size: Normal)   Pulse 60   Ht 5\' 1"  (1.549 m)   Wt 170 lb 6.4 oz (77.3 kg)   SpO2 98%   BMI 32.20 kg/m     Wt Readings from Last 3 Encounters:  06/16/17 170 lb 6.4 oz (77.3 kg)  05/11/17 170 lb (77.1 kg)  01/14/17 165 lb 1.9 oz (74.9 kg)     GEN: Patient is in no acute distress HEENT: Normal NECK: No JVD; No carotid bruits LYMPHATICS: No lymphadenopathy CARDIAC: Hear sounds regular, 2/6 systolic murmur at the apex. RESPIRATORY:  Clear to auscultation without rales, wheezing or rhonchi  ABDOMEN: Soft, non-tender, non-distended MUSCULOSKELETAL:  No edema; No deformity  SKIN: Warm and dry NEUROLOGIC:  Alert and oriented x 3 PSYCHIATRIC:  Normal affect   Signed, Jenean Lindau, MD  06/16/2017 3:33 PM    [facility]

## 2017-06-16 NOTE — Patient Instructions (Signed)
Medication Instructions:  Your physician has recommended you make the following change in your medication:  CHANGE ranexa to 1,000 mg twice daily  Labwork: Your physician recommends that you have the following labs drawn: BMP, TSH, liver function panel, amylase and lipase.  Testing/Procedures: None  Follow-Up: Your physician recommends that you schedule a follow-up appointment in: 6 months  Any Other Special Instructions Will Be Listed Below (If Applicable).     If you need a refill on your cardiac medications before your next appointment, please call your pharmacy.   [place], RN, BSN

## 2017-06-17 LAB — BASIC METABOLIC PANEL
BUN/Creatinine Ratio: 16 (ref 12–28)
BUN: 23 mg/dL (ref 8–27)
CALCIUM: 10.2 mg/dL (ref 8.7–10.3)
CHLORIDE: 103 mmol/L (ref 96–106)
CO2: 24 mmol/L (ref 20–29)
Creatinine, Ser: 1.44 mg/dL — ABNORMAL HIGH (ref 0.57–1.00)
GFR calc Af Amer: 42 mL/min/{1.73_m2} — ABNORMAL LOW (ref >59)
GFR calc non Af Amer: 37 mL/min/{1.73_m2} — ABNORMAL LOW (ref >59)
Glucose: 109 mg/dL — ABNORMAL HIGH (ref 65–99)
POTASSIUM: 4.5 mmol/L (ref 3.5–5.2)
Sodium: 142 mmol/L (ref 134–144)

## 2017-06-17 LAB — LIPASE: LIPASE: 28 U/L (ref 14–85)

## 2017-06-17 LAB — HEPATIC FUNCTION PANEL
ALBUMIN: 4.1 g/dL (ref 3.5–4.8)
ALT: 15 IU/L (ref 0–32)
AST: 19 IU/L (ref 0–40)
Alkaline Phosphatase: 64 IU/L (ref 39–117)
BILIRUBIN TOTAL: 0.9 mg/dL (ref 0.0–1.2)
Bilirubin, Direct: 0.22 mg/dL (ref 0.00–0.40)
TOTAL PROTEIN: 5.9 g/dL — AB (ref 6.0–8.5)

## 2017-06-17 LAB — TSH: TSH: 1.83 u[IU]/mL (ref 0.450–4.500)

## 2017-06-17 LAB — AMYLASE: Amylase: 72 U/L (ref 31–124)

## 2017-06-18 ENCOUNTER — Other Ambulatory Visit: Payer: Self-pay

## 2017-06-18 MED ORDER — RANOLAZINE ER 1000 MG PO TB12: 500.0000 mg | ORAL_TABLET | Freq: Two times a day (BID) | ORAL | 3 refills | Status: DC

## 2017-06-24 DIAGNOSIS — E1129 Type 2 diabetes mellitus with other diabetic kidney complication: Secondary | ICD-10-CM | POA: Diagnosis not present

## 2017-06-24 DIAGNOSIS — Z6832 Body mass index (BMI) 32.0-32.9, adult: Secondary | ICD-10-CM | POA: Diagnosis not present

## 2017-06-24 DIAGNOSIS — N183 Chronic kidney disease, stage 3 (moderate): Secondary | ICD-10-CM | POA: Diagnosis not present

## 2017-07-24 DIAGNOSIS — M5126 Other intervertebral disc displacement, lumbar region: Secondary | ICD-10-CM | POA: Diagnosis not present

## 2017-07-27 DIAGNOSIS — M545 Low back pain: Secondary | ICD-10-CM | POA: Diagnosis not present

## 2017-08-03 DIAGNOSIS — M5126 Other intervertebral disc displacement, lumbar region: Secondary | ICD-10-CM | POA: Diagnosis not present

## 2017-08-03 DIAGNOSIS — M5416 Radiculopathy, lumbar region: Secondary | ICD-10-CM | POA: Diagnosis not present

## 2017-08-06 DIAGNOSIS — Z6831 Body mass index (BMI) 31.0-31.9, adult: Secondary | ICD-10-CM | POA: Diagnosis not present

## 2017-08-06 DIAGNOSIS — Z1331 Encounter for screening for depression: Secondary | ICD-10-CM | POA: Diagnosis not present

## 2017-08-06 DIAGNOSIS — Z0000 Encounter for general adult medical examination without abnormal findings: Secondary | ICD-10-CM | POA: Diagnosis not present

## 2017-08-06 DIAGNOSIS — Z9181 History of falling: Secondary | ICD-10-CM | POA: Diagnosis not present

## 2017-08-06 DIAGNOSIS — F329 Major depressive disorder, single episode, unspecified: Secondary | ICD-10-CM | POA: Diagnosis not present

## 2017-08-06 DIAGNOSIS — E669 Obesity, unspecified: Secondary | ICD-10-CM | POA: Diagnosis not present

## 2017-08-06 DIAGNOSIS — D509 Iron deficiency anemia, unspecified: Secondary | ICD-10-CM | POA: Diagnosis not present

## 2017-08-06 DIAGNOSIS — I2510 Atherosclerotic heart disease of native coronary artery without angina pectoris: Secondary | ICD-10-CM | POA: Diagnosis not present

## 2017-08-06 DIAGNOSIS — E1129 Type 2 diabetes mellitus with other diabetic kidney complication: Secondary | ICD-10-CM | POA: Diagnosis not present

## 2017-08-06 DIAGNOSIS — Z136 Encounter for screening for cardiovascular disorders: Secondary | ICD-10-CM | POA: Diagnosis not present

## 2017-08-06 DIAGNOSIS — E785 Hyperlipidemia, unspecified: Secondary | ICD-10-CM | POA: Diagnosis not present

## 2017-08-21 ENCOUNTER — Telehealth: Payer: Self-pay | Admitting: Cardiology

## 2017-08-21 NOTE — Telephone Encounter (Signed)
Left message with family member for patient to return call.

## 2017-08-21 NOTE — Telephone Encounter (Signed)
Wants to know if she's clear to get her epidural

## 2017-08-21 NOTE — Telephone Encounter (Signed)
Patient advised of need to discuss epidural with Dr. Geraldo Pitter. Appointment made for 08/27/17 at 3:20 pm. Patient verbalized understanding of appointment date and time. No further questions.

## 2017-08-21 NOTE — Telephone Encounter (Signed)
Epidural scheduled for next Thursday at 10 am. Dr. Maryjean Ka at [facility] is performing.  Please advise.

## 2017-08-27 ENCOUNTER — Encounter: Payer: Self-pay | Admitting: *Deleted

## 2017-08-27 ENCOUNTER — Encounter: Payer: Self-pay | Admitting: Cardiology

## 2017-08-27 ENCOUNTER — Ambulatory Visit (INDEPENDENT_AMBULATORY_CARE_PROVIDER_SITE_OTHER): Payer: PPO | Admitting: Cardiology

## 2017-08-27 VITALS — BP 126/78 | HR 64 | Ht 61.0 in | Wt 164.1 lb

## 2017-08-27 DIAGNOSIS — E782 Mixed hyperlipidemia: Secondary | ICD-10-CM

## 2017-08-27 DIAGNOSIS — I208 Other forms of angina pectoris: Secondary | ICD-10-CM

## 2017-08-27 DIAGNOSIS — I2510 Atherosclerotic heart disease of native coronary artery without angina pectoris: Secondary | ICD-10-CM

## 2017-08-27 DIAGNOSIS — I10 Essential (primary) hypertension: Secondary | ICD-10-CM | POA: Diagnosis not present

## 2017-08-27 DIAGNOSIS — I209 Angina pectoris, unspecified: Secondary | ICD-10-CM | POA: Diagnosis not present

## 2017-08-27 DIAGNOSIS — Z905 Acquired absence of kidney: Secondary | ICD-10-CM | POA: Diagnosis not present

## 2017-08-27 NOTE — Progress Notes (Signed)
Cardiology Office Note:    Date:  08/27/2017   ID:  Julia Logan, DOB [birth year], MRN [id number]  PCP:  Nicoletta Dress, MD  Cardiologist:  Jenean Lindau, MD   Referring MD: Nicoletta Dress, MD    ASSESSMENT:    1. Angina pectoris ([place])   2. Essential hypertension   3. Coronary artery disease involving native coronary artery, angina presence unspecified, unspecified whether native or transplanted heart   4. History of nephrectomy   5. Mixed hyperlipidemia    PLAN:    In order of problems listed above:  1. Secondary prevention stressed with the patient.  Importance of compliance with diet and medications stressed and she vocalized understanding. 2. The patient has extensive coronary artery disease and her symptoms are of concern.  Before I left her go off the Effient I want to make sure that her symptoms do not reflect angina.  The patient will undergo Lexiscan sestamibi to make sure she has no objective evidence of coronary artery disease.  If this is negative then I will let her go off Effient for a few days or rather as less days as possible to get her epidural, as her back pain  is a significant cause of concern and distress to her and affecting her quality of life 3. Her blood pressure is stable and diet was discussed with dyslipidemia. 4. Patient will be seen in follow-up appointment in 6 months or earlier if the patient has any concerns  Sublingual nitroglycerin prescription was sent, its protocol and 911 protocol explained and the patient vocalized understanding questions were answered to the patient's satisfaction   Medication Adjustments/Labs and Tests Ordered: Current medicines are reviewed at length with the patient today.  Concerns regarding medicines are outlined above.  No orders of the defined types were placed in this encounter.  No orders of the defined types were placed in this encounter.    No chief complaint on file.    History of Present  Illness:    Julia Logan is a 72 y.o. female.  The patient has known coronary artery disease and has undergone coronary stenting on multiple occasions.  The last time being October 2018.  She mentions to me that she has had significant issues with her back and is contemplating an epidural.  That is why she is here to see if she can stop the Effient.  She also mentions to me that she woke up twice in succession in the night with chest tightness.  This was relieved by itself.  She did not take any nitroglycerin.  For the past 48-hour she has had no history of chest pain.  No orthopnea or PND.  At the time of my evaluation, the patient is alert awake oriented and in no distress.  Past Medical History:  Diagnosis Date  . Cervical spondylosis without myelopathy   . Colon polyps   . Diverticulitis of colon (without mention of hemorrhage)(562.11)   . Esophageal reflux   . Essential hypertension, benign   . Iron deficiency anemia, unspecified   . Osteoarthrosis, unspecified whether generalized or localized, shoulder region   . Osteoporosis   . Other and unspecified hyperlipidemia   . Restless legs syndrome (RLS)   . Type II or unspecified type diabetes mellitus without mention of complication, not stated as uncontrolled     Past Surgical History:  Procedure Laterality Date  . APPENDECTOMY  1977  . BUNIONECTOMY    . CORONARY STENT INTERVENTION N/A 01/01/2017  Procedure: CORONARY STENT INTERVENTION;  Surgeon: Martinique, Peter M, MD;  Location: [facility];  Service: Cardiovascular;  Laterality: N/A;  . KNEE SURGERY    . LEFT HEART CATH AND CORONARY ANGIOGRAPHY N/A 01/01/2017   Procedure: LEFT HEART CATH AND CORONARY ANGIOGRAPHY;  Surgeon: Martinique, Peter M, MD;  Location: [facility];  Service: Cardiovascular;  Laterality: N/A;  . VAGINAL HYSTERECTOMY  1977    Current Medications: Current Meds  Medication Sig  . ACCU-CHEK AVIVA PLUS test strip 1 strip.  Marland Kitchen ACCU-CHEK SOFTCLIX  LANCETS lancets USE TO CHECK BLOOD SUGAR QD  . alendronate (FOSAMAX) 70 MG tablet Take 70 mg by mouth every Saturday. Take with a full glass of water on an empty stomach.   Marland Kitchen aspirin 81 MG tablet Take 81 mg by mouth daily.  Marland Kitchen atorvastatin (LIPITOR) 80 MG tablet Take 80 mg by mouth daily.  . Blood Glucose Monitoring Suppl (ACCU-CHEK AVIVA PLUS) w/Device KIT Check BS once a day  . Calcium Carbonate-Vit D-Min (CALCIUM 1200) 1200-1000 MG-UNIT CHEW Chew 1 tablet by mouth 2 (two) times daily.   . Cholecalciferol (VITAMIN D) 400 UNITS capsule Take 800 Units by mouth daily.    Marland Kitchen estradiol (ESTRACE) 0.1 MG/GM vaginal cream Place 1 Applicatorful vaginally 3 (three) times a week.  . ezetimibe (ZETIA) 10 MG tablet TK 1 T PO D  . Ferrous Sulfate (IRON) 325 (65 Fe) MG TABS Take 325 mg by mouth 2 (two) times daily.   Marland Kitchen glimepiride (AMARYL) 4 MG tablet Take 1 tablet by mouth daily.  Marland Kitchen lisinopril (PRINIVIL,ZESTRIL) 40 MG tablet Take 40 mg by mouth daily.   . metoprolol tartrate (LOPRESSOR) 25 MG tablet Take 12.5 mg by mouth 2 (two) times daily.  . nitroGLYCERIN (NITROSTAT) 0.4 MG SL tablet Place 1 tablet (0.4 mg total) under the tongue every 5 (five) minutes as needed for chest pain.  . prasugrel (EFFIENT) 10 MG TABS tablet Take 10 mg by mouth daily  . Probiotic Product (PROBIOTIC PO) Take 1 capsule by mouth daily.  . ranolazine (RANEXA) 1000 MG SR tablet Take 1 tablet (1,000 mg total) by mouth 2 (two) times daily.  Marland Kitchen venlafaxine XR (EFFEXOR-XR) 75 MG 24 hr capsule Take 75 mg by mouth daily  . [DISCONTINUED] aspirin 81 MG chewable tablet Chew and swallow 81 mg by mouth once daily  . [DISCONTINUED] pantoprazole (PROTONIX) 40 MG tablet Take 1 tablet (40 mg total) by mouth daily.     Allergies:   Gabapentin; Ticagrelor; and Codeine   Social History   Socioeconomic History  . Marital status: Married    Spouse name: Not on file  . Number of children: 2  . Years of education: Not on file  . Highest education  level: Not on file  Occupational History  . Occupation: retired  Scientific laboratory technician  . Financial resource strain: Not on file  . Food insecurity:    Worry: Not on file    Inability: Not on file  . Transportation needs:    Medical: Not on file    Non-medical: Not on file  Tobacco Use  . Smoking status: Never Smoker  . Smokeless tobacco: Never Used  Substance and Sexual Activity  . Alcohol use: No  . Drug use: No  . Sexual activity: Not on file  Lifestyle  . Physical activity:    Days per week: Not on file    Minutes per session: Not on file  . Stress: Not on file  Relationships  .  Social connections:    Talks on phone: Not on file    Gets together: Not on file    Attends religious service: Not on file    Active member of club or organization: Not on file    Attends meetings of clubs or organizations: Not on file    Relationship status: Not on file  Other Topics Concern  . Not on file  Social History Narrative  . Not on file     Family History: The patient's family history includes Diabetes in her maternal grandfather. There is no history of Colon cancer.  ROS:   Please see the history of present illness.    All other systems reviewed and are negative.  EKGs/Labs/Other Studies Reviewed:    The following studies were reviewed today: I discussed my findings with the patient at extensive length.   Recent Labs: 01/02/2017: Hemoglobin 10.7; Platelets 179 06/16/2017: ALT 15; BUN 23; Creatinine, Ser 1.44; Potassium 4.5; Sodium 142; TSH 1.830  Recent Lipid Panel No results found for: CHOL, TRIG, HDL, CHOLHDL, VLDL, LDLCALC, LDLDIRECT  Physical Exam:    VS:  BP 126/78 (BP Location: Right Arm, Patient Position: Sitting, Cuff Size: Normal)   Pulse 64   Ht '5\' 1"'  (1.549 m)   Wt 164 lb 1.9 oz (74.4 kg)   SpO2 97%   BMI 31.01 kg/m     Wt Readings from Last 3 Encounters:  08/27/17 164 lb 1.9 oz (74.4 kg)  06/16/17 170 lb 6.4 oz (77.3 kg)  05/11/17 170 lb (77.1 kg)      GEN: Patient is in no acute distress HEENT: Normal NECK: No JVD; No carotid bruits LYMPHATICS: No lymphadenopathy CARDIAC: Hear sounds regular, 2/6 systolic murmur at the apex. RESPIRATORY:  Clear to auscultation without rales, wheezing or rhonchi  ABDOMEN: Soft, non-tender, non-distended MUSCULOSKELETAL:  No edema; No deformity  SKIN: Warm and dry NEUROLOGIC:  Alert and oriented x 3 PSYCHIATRIC:  Normal affect   Signed, Jenean Lindau, MD  08/27/2017 3:56 PM    [facility]

## 2017-08-27 NOTE — Patient Instructions (Signed)
Medication Instructions:  Your physician recommends that you continue on your current medications as directed. Please refer to the Current Medication list given to you today.   Labwork: None  Testing/Procedures: Your physician has requested that you have a lexiscan myoview. For further information please visit [email or web address]. Please follow instruction sheet, as given.   Follow-Up: Your physician wants you to follow-up in: 6 months. You will receive a reminder letter in the mail two months in advance. If you don't receive a letter, please call our office to schedule the follow-up appointment.   If you need a refill on your cardiac medications before your next appointment, please call your pharmacy.   Thank you for choosing [facility]! Robyne Peers, RN [phone]

## 2017-08-27 NOTE — Addendum Note (Signed)
Addended by: Austin Miles on: 08/27/2017 04:15 PM   Modules accepted: Orders

## 2017-09-02 ENCOUNTER — Telehealth: Payer: Self-pay | Admitting: Cardiology

## 2017-09-02 NOTE — Telephone Encounter (Signed)
Has questions about meds before myo

## 2017-09-02 NOTE — Telephone Encounter (Signed)
Confirmed with patient to hold her glimipride the day of cardiac testing.

## 2017-09-02 NOTE — Telephone Encounter (Signed)
Left voicemail requesting that the patient call the office.

## 2017-09-03 ENCOUNTER — Telehealth (HOSPITAL_COMMUNITY): Payer: Self-pay | Admitting: *Deleted

## 2017-09-03 NOTE — Telephone Encounter (Signed)
Patient given detailed instructions per Myocardial Perfusion Study Information Sheet for the test on 09/07/17. Patient notified to arrive 15 minutes early and that it is imperative to arrive on time for appointment to keep from having the test rescheduled.  If you need to cancel or reschedule your appointment, please call the office within 24 hours of your appointment. . Patient verbalized understanding. Julia Logan

## 2017-09-07 ENCOUNTER — Ambulatory Visit (HOSPITAL_COMMUNITY): Payer: PPO | Attending: Internal Medicine

## 2017-09-07 VITALS — Ht 61.0 in | Wt 164.0 lb

## 2017-09-07 DIAGNOSIS — I208 Other forms of angina pectoris: Secondary | ICD-10-CM | POA: Insufficient documentation

## 2017-09-07 DIAGNOSIS — I209 Angina pectoris, unspecified: Secondary | ICD-10-CM

## 2017-09-07 DIAGNOSIS — I2510 Atherosclerotic heart disease of native coronary artery without angina pectoris: Secondary | ICD-10-CM | POA: Diagnosis not present

## 2017-09-07 LAB — MYOCARDIAL PERFUSION IMAGING
LHR: 0.33
LVDIAVOL: 84 mL (ref 46–106)
LVSYSVOL: 35 mL
NUC STRESS TID: 1.14
Peak HR: 85 {beats}/min
Rest HR: 53 {beats}/min
SDS: 12
SRS: 5
SSS: 15

## 2017-09-07 MED ORDER — REGADENOSON 0.4 MG/5ML IV SOLN
0.4000 mg | Freq: Once | INTRAVENOUS | Status: AC
  Administered 2017-09-07: 0.4 mg via INTRAVENOUS

## 2017-09-07 MED ORDER — TECHNETIUM TC 99M TETROFOSMIN IV KIT
10.6000 | PACK | Freq: Once | INTRAVENOUS | Status: AC | PRN
  Administered 2017-09-07: 10.6 via INTRAVENOUS
  Filled 2017-09-07: qty 11

## 2017-09-07 MED ORDER — TECHNETIUM TC 99M TETROFOSMIN IV KIT
31.2000 | PACK | Freq: Once | INTRAVENOUS | Status: AC | PRN
  Administered 2017-09-07: 31.2 via INTRAVENOUS
  Filled 2017-09-07: qty 32

## 2017-09-09 ENCOUNTER — Ambulatory Visit (INDEPENDENT_AMBULATORY_CARE_PROVIDER_SITE_OTHER): Payer: PPO | Admitting: Cardiology

## 2017-09-09 ENCOUNTER — Encounter: Payer: Self-pay | Admitting: Cardiology

## 2017-09-09 VITALS — BP 118/82 | HR 52 | Ht 61.0 in | Wt 168.0 lb

## 2017-09-09 DIAGNOSIS — I10 Essential (primary) hypertension: Secondary | ICD-10-CM | POA: Diagnosis not present

## 2017-09-09 DIAGNOSIS — Z905 Acquired absence of kidney: Secondary | ICD-10-CM

## 2017-09-09 DIAGNOSIS — I208 Other forms of angina pectoris: Secondary | ICD-10-CM

## 2017-09-09 DIAGNOSIS — E0800 Diabetes mellitus due to underlying condition with hyperosmolarity without nonketotic hyperglycemic-hyperosmolar coma (NKHHC): Secondary | ICD-10-CM | POA: Diagnosis not present

## 2017-09-09 DIAGNOSIS — R9439 Abnormal result of other cardiovascular function study: Secondary | ICD-10-CM

## 2017-09-09 DIAGNOSIS — I2510 Atherosclerotic heart disease of native coronary artery without angina pectoris: Secondary | ICD-10-CM

## 2017-09-09 DIAGNOSIS — E782 Mixed hyperlipidemia: Secondary | ICD-10-CM | POA: Diagnosis not present

## 2017-09-09 HISTORY — DX: Abnormal result of other cardiovascular function study: R94.39

## 2017-09-09 NOTE — Progress Notes (Signed)
Cardiology Office Note:    Date:  09/09/2017   ID:  Julia Logan, DOB 1945-09-02, MRN [id number]  PCP:  Nicoletta Dress, MD  Cardiologist:  Jenean Lindau, MD   Referring MD: Nicoletta Dress, MD    ASSESSMENT:    1. Coronary artery disease involving native coronary artery of native heart without angina pectoris   2. Essential hypertension   3. Stable angina pectoris ([place])   4. Diabetes mellitus due to underlying condition with hyperosmolarity without coma, without long-term current use of insulin ([place])   5. History of nephrectomy   6. Mixed hyperlipidemia    PLAN:    In order of problems listed above:  1. Secondary prevention stressed with the patient.  Importance of compliance with diet and medications stressed and she vocalized understanding.  Blood pressure stable.  I urged her to continue living an active lifestyle and she vocalized understanding. 2. Her stress test reveals mild area of ischemia in the territory which was not thought to be optimal for intervention the last time she underwent coronary angiography.  In view of this and the fact that she is not very symptomatic continued medical therapy was discussed with her and she opts for the option after discussing benefits and potential risks. 3. Patient will be seen in follow-up appointment in 6 months or earlier if the patient has any concerns 4. She will be back in 1 month for liver lipid check and Chem-7 at our [place] office.   Medication Adjustments/Labs and Tests Ordered: Current medicines are reviewed at length with the patient today.  Concerns regarding medicines are outlined above.  Orders Placed This Encounter  Procedures  . Basic Metabolic Panel (BMET)  . Hepatic function panel  . Lipid Profile   No orders of the defined types were placed in this encounter.    No chief complaint on file.    History of Present Illness:    Julia Logan is a 72 y.o. female.  Patient has a history of coronary  artery disease.  She was evaluated by stress test which showed mild inferior wall ischemia.  Patient tells me that she is active.  She cannot walk because of her arthritis problems but she is exercising and other forms without any symptoms.  At the time of my evaluation, the patient is alert awake oriented and in no distress.  Past Medical History:  Diagnosis Date  . Cervical spondylosis without myelopathy   . Colon polyps   . Diverticulitis of colon (without mention of hemorrhage)(562.11)   . Esophageal reflux   . Essential hypertension, benign   . Iron deficiency anemia, unspecified   . Osteoarthrosis, unspecified whether generalized or localized, shoulder region   . Osteoporosis   . Other and unspecified hyperlipidemia   . Restless legs syndrome (RLS)   . Type II or unspecified type diabetes mellitus without mention of complication, not stated as uncontrolled     Past Surgical History:  Procedure Laterality Date  . APPENDECTOMY  1977  . BUNIONECTOMY    . CORONARY STENT INTERVENTION N/A 01/01/2017   Procedure: CORONARY STENT INTERVENTION;  Surgeon: Martinique, Peter M, MD;  Location: [facility];  Service: Cardiovascular;  Laterality: N/A;  . KNEE SURGERY    . LEFT HEART CATH AND CORONARY ANGIOGRAPHY N/A 01/01/2017   Procedure: LEFT HEART CATH AND CORONARY ANGIOGRAPHY;  Surgeon: Martinique, Peter M, MD;  Location: [facility];  Service: Cardiovascular;  Laterality: N/A;  . VAGINAL HYSTERECTOMY  1977    Current Medications: Current Meds  Medication Sig  . ACCU-CHEK AVIVA PLUS test strip 1 strip.  Marland Kitchen ACCU-CHEK SOFTCLIX LANCETS lancets USE TO CHECK BLOOD SUGAR QD  . alendronate (FOSAMAX) 70 MG tablet Take 70 mg by mouth every Saturday. Take with a full glass of water on an empty stomach.   Marland Kitchen aspirin 81 MG tablet Take 81 mg by mouth daily.  Marland Kitchen atorvastatin (LIPITOR) 80 MG tablet Take 80 mg by mouth daily.  . Blood Glucose Monitoring Suppl (ACCU-CHEK AVIVA PLUS) w/Device KIT  Check BS once a day  . Calcium Carbonate-Vit D-Min (CALCIUM 1200) 1200-1000 MG-UNIT CHEW Chew 1 tablet by mouth 2 (two) times daily.   Marland Kitchen estradiol (ESTRACE) 0.1 MG/GM vaginal cream Place 1 Applicatorful vaginally 3 (three) times a week.  . ezetimibe (ZETIA) 10 MG tablet TK 1 T PO D  . Ferrous Sulfate (IRON) 325 (65 Fe) MG TABS Take 325 mg by mouth 2 (two) times daily.   Marland Kitchen glimepiride (AMARYL) 4 MG tablet Take 1 tablet by mouth daily.  Marland Kitchen lisinopril (PRINIVIL,ZESTRIL) 40 MG tablet Take 40 mg by mouth daily.   . metoprolol tartrate (LOPRESSOR) 25 MG tablet Take 12.5 mg by mouth 2 (two) times daily.  . nitroGLYCERIN (NITROSTAT) 0.4 MG SL tablet Place 1 tablet (0.4 mg total) under the tongue every 5 (five) minutes as needed for chest pain.  . prasugrel (EFFIENT) 10 MG TABS tablet Take 10 mg by mouth daily  . Probiotic Product (PROBIOTIC PO) Take 1 capsule by mouth daily.  . ranolazine (RANEXA) 1000 MG SR tablet Take 1 tablet (1,000 mg total) by mouth 2 (two) times daily.  Marland Kitchen venlafaxine XR (EFFEXOR-XR) 75 MG 24 hr capsule Take 75 mg by mouth daily     Allergies:   Gabapentin; Ticagrelor; and Codeine   Social History   Socioeconomic History  . Marital status: Married    Spouse name: Not on file  . Number of children: 2  . Years of education: Not on file  . Highest education level: Not on file  Occupational History  . Occupation: retired  Scientific laboratory technician  . Financial resource strain: Not on file  . Food insecurity:    Worry: Not on file    Inability: Not on file  . Transportation needs:    Medical: Not on file    Non-medical: Not on file  Tobacco Use  . Smoking status: Never Smoker  . Smokeless tobacco: Never Used  Substance and Sexual Activity  . Alcohol use: No  . Drug use: No  . Sexual activity: Not on file  Lifestyle  . Physical activity:    Days per week: Not on file    Minutes per session: Not on file  . Stress: Not on file  Relationships  . Social connections:    Talks on  phone: Not on file    Gets together: Not on file    Attends religious service: Not on file    Active member of club or organization: Not on file    Attends meetings of clubs or organizations: Not on file    Relationship status: Not on file  Other Topics Concern  . Not on file  Social History Narrative  . Not on file     Family History: The patient's family history includes Diabetes in her maternal grandfather. There is no history of Colon cancer.  ROS:   Please see the history of present illness.    All other systems reviewed  and are negative.  EKGs/Labs/Other Studies Reviewed:    The following studies were reviewed today: I discussed the findings of the stress test with the patient at extensive length.   Recent Labs: 01/02/2017: Hemoglobin 10.7; Platelets 179 06/16/2017: ALT 15; BUN 23; Creatinine, Ser 1.44; Potassium 4.5; Sodium 142; TSH 1.830  Recent Lipid Panel No results found for: CHOL, TRIG, HDL, CHOLHDL, VLDL, LDLCALC, LDLDIRECT  Physical Exam:    VS:  BP 118/82 (BP Location: Right Arm, Patient Position: Sitting, Cuff Size: Normal)   Pulse (!) 52   Ht _0  (1.549 m)   Wt 168 lb (76.2 kg)   SpO2 100%   BMI 31.74 kg/m     Wt Readings from Last 3 Encounters:  09/09/17 168 lb (76.2 kg)  09/07/17 164 lb (74.4 kg)  08/27/17 164 lb 1.9 oz (74.4 kg)     GEN: Patient is in no acute distress HEENT: Normal NECK: No JVD; No carotid bruits LYMPHATICS: No lymphadenopathy CARDIAC: Hear sounds regular, 2/6 systolic murmur at the apex. RESPIRATORY:  Clear to auscultation without rales, wheezing or rhonchi  ABDOMEN: Soft, non-tender, non-distended MUSCULOSKELETAL:  No edema; No deformity  SKIN: Warm and dry NEUROLOGIC:  Alert and oriented x 3 PSYCHIATRIC:  Normal affect   Signed, Jenean Lindau, MD  09/09/2017 9:51 AM    [place]

## 2017-09-09 NOTE — Patient Instructions (Addendum)
Medication Instructions:  Your physician recommends that you continue on your current medications as directed. Please refer to the Current Medication list given to you today.  Labwork: Your physician recommends that you return for lab work in 81 month in our [place] office located at [address]: [employer], hepatic function pane, lipid panel.   Testing/Procedures: None  Follow-Up: Your physician wants you to follow-up in: 6 months. You will receive a reminder letter in the mail two months in advance. If you don't receive a letter, please call our office to schedule the follow-up appointment.   If you need a refill on your cardiac medications before your next appointment, please call your pharmacy.   Thank you for choosing [facility]! Robyne Peers, RN [phone]

## 2017-09-15 DIAGNOSIS — H8113 Benign paroxysmal vertigo, bilateral: Secondary | ICD-10-CM | POA: Diagnosis not present

## 2017-09-15 DIAGNOSIS — Z6832 Body mass index (BMI) 32.0-32.9, adult: Secondary | ICD-10-CM | POA: Diagnosis not present

## 2017-09-15 DIAGNOSIS — K625 Hemorrhage of anus and rectum: Secondary | ICD-10-CM | POA: Diagnosis not present

## 2017-09-30 DIAGNOSIS — C642 Malignant neoplasm of left kidney, except renal pelvis: Secondary | ICD-10-CM | POA: Diagnosis not present

## 2017-10-09 DIAGNOSIS — C642 Malignant neoplasm of left kidney, except renal pelvis: Secondary | ICD-10-CM | POA: Diagnosis not present

## 2017-10-09 DIAGNOSIS — K449 Diaphragmatic hernia without obstruction or gangrene: Secondary | ICD-10-CM | POA: Diagnosis not present

## 2017-10-09 HISTORY — DX: Malignant neoplasm of left kidney, except renal pelvis: C64.2

## 2017-10-15 DIAGNOSIS — I2510 Atherosclerotic heart disease of native coronary artery without angina pectoris: Secondary | ICD-10-CM | POA: Diagnosis not present

## 2017-10-16 LAB — BASIC METABOLIC PANEL
BUN / CREAT RATIO: 15 (ref 12–28)
BUN: 22 mg/dL (ref 8–27)
CALCIUM: 9.9 mg/dL (ref 8.7–10.3)
CHLORIDE: 105 mmol/L (ref 96–106)
CO2: 24 mmol/L (ref 20–29)
CREATININE: 1.44 mg/dL — AB (ref 0.57–1.00)
GFR calc non Af Amer: 37 mL/min/{1.73_m2} — ABNORMAL LOW (ref >59)
GFR, EST AFRICAN AMERICAN: 42 mL/min/{1.73_m2} — AB (ref >59)
Glucose: 116 mg/dL — ABNORMAL HIGH (ref 65–99)
Potassium: 4.4 mmol/L (ref 3.5–5.2)
Sodium: 143 mmol/L (ref 134–144)

## 2017-10-16 LAB — HEPATIC FUNCTION PANEL
ALK PHOS: 64 IU/L (ref 39–117)
ALT: 21 IU/L (ref 0–32)
AST: 22 IU/L (ref 0–40)
Albumin: 4.1 g/dL (ref 3.5–4.8)
BILIRUBIN TOTAL: 0.7 mg/dL (ref 0.0–1.2)
Bilirubin, Direct: 0.19 mg/dL (ref 0.00–0.40)
TOTAL PROTEIN: 6.1 g/dL (ref 6.0–8.5)

## 2017-10-16 LAB — LIPID PANEL
CHOL/HDL RATIO: 2.6 ratio (ref 0.0–4.4)
Cholesterol, Total: 143 mg/dL (ref 100–199)
HDL: 54 mg/dL (ref >39)
LDL CALC: 68 mg/dL (ref 0–99)
Triglycerides: 106 mg/dL (ref 0–149)
VLDL Cholesterol Cal: 21 mg/dL (ref 5–40)

## 2017-10-22 ENCOUNTER — Ambulatory Visit: Payer: PPO | Admitting: Cardiology

## 2017-10-27 ENCOUNTER — Encounter: Payer: Self-pay | Admitting: Cardiology

## 2017-10-27 ENCOUNTER — Ambulatory Visit (HOSPITAL_BASED_OUTPATIENT_CLINIC_OR_DEPARTMENT_OTHER)
Admission: RE | Admit: 2017-10-27 | Discharge: 2017-10-27 | Disposition: A | Discharge status: Home / Self Care | Payer: PPO | Source: Ambulatory Visit | Attending: Cardiology | Admitting: Cardiology

## 2017-10-27 ENCOUNTER — Ambulatory Visit (INDEPENDENT_AMBULATORY_CARE_PROVIDER_SITE_OTHER): Payer: PPO | Admitting: Cardiology

## 2017-10-27 VITALS — BP 114/84 | HR 68 | Ht 61.0 in | Wt 174.4 lb

## 2017-10-27 DIAGNOSIS — R0789 Other chest pain: Secondary | ICD-10-CM | POA: Diagnosis not present

## 2017-10-27 DIAGNOSIS — E782 Mixed hyperlipidemia: Secondary | ICD-10-CM

## 2017-10-27 DIAGNOSIS — Z01818 Encounter for other preprocedural examination: Secondary | ICD-10-CM | POA: Insufficient documentation

## 2017-10-27 DIAGNOSIS — E0800 Diabetes mellitus due to underlying condition with hyperosmolarity without nonketotic hyperglycemic-hyperosmolar coma (NKHHC): Secondary | ICD-10-CM

## 2017-10-27 DIAGNOSIS — I10 Essential (primary) hypertension: Secondary | ICD-10-CM | POA: Diagnosis not present

## 2017-10-27 DIAGNOSIS — N183 Chronic kidney disease, stage 3 unspecified: Secondary | ICD-10-CM

## 2017-10-27 DIAGNOSIS — I25119 Atherosclerotic heart disease of native coronary artery with unspecified angina pectoris: Secondary | ICD-10-CM | POA: Diagnosis not present

## 2017-10-27 HISTORY — DX: Chronic kidney disease, stage 3 unspecified: N18.30

## 2017-10-27 NOTE — Patient Instructions (Signed)
Medication Instructions:  Your physician recommends that you continue on your current medications as directed. Please refer to the Current Medication list given to you today.   Labwork: Your physician recommends that you return for lab work within 3-7 days before scheduled procedure: CBC, BMP.   Testing/Procedures: You had an EKG today.   A chest x-ray takes a picture of the organs and structures inside the chest, including the heart, lungs, and blood vessels. This test can show several things, including, whether the heart is enlarges; whether fluid is building up in the lungs; and whether pacemaker / defibrillator leads are still in place.      [facility] [address] [place] [place], [place] [address] Dept: [phone] Loc: [phone]  Julia Logan  10/27/2017  You are scheduled for a Cardiac Catheterization on Monday, August 26 with Dr. Peter Martinique.  1. Please arrive at the [facility] (Main Entrance A) at [facility]: [address] [place], [place] [address] at 6:00 am (This time is two hours before your procedure to ensure your preparation). Free valet parking service is available.   Special note: Every effort is made to have your procedure done on time. Please understand that emergencies sometimes delay scheduled procedures.  2. Diet: Do not eat solid foods after midnight.  The patient may have clear liquids until 5am upon the day of the procedure.  3. Labs: You will need to have blood drawn on Tuesday, August 20 at our [place] office. You do not need to be fasting.  4. Medication instructions in preparation for your procedure:   Contrast Allergy: No  Stop taking, Lisinopril (Zestril or Prinivil) on Sunday, August 25.    On the morning of your procedure, take your Effient/Prasugrel, aspirin, and any morning medicines NOT listed above.  You may use sips of water.  5. Plan  for one night stay--bring personal belongings. 6. Bring a current list of your medications and current insurance cards. 7. You MUST have a responsible person to drive you home. 8. Someone MUST be with you the first 24 hours after you arrive home or your discharge will be delayed. 9. Please wear clothes that are easy to get on and off and wear slip-on shoes.  Thank you for allowing [place] to care for you!   -- [facility]    Follow-Up: Your physician recommends that you schedule a follow-up appointment in: 1 month.   If you need a refill on your cardiac medications before your next appointment, please call your pharmacy.   Thank you for choosing [facility]! Robyne Peers, RN [phone]    Coronary Angiogram With Stent Coronary angiogram with stent placement is a procedure to widen or open a narrow blood vessel of the heart (coronary artery). Arteries may become blocked by cholesterol buildup (plaques) in the lining of the wall. When a coronary artery becomes partially blocked, blood flow to that area decreases. This may lead to chest pain or a heart attack (myocardial infarction). A stent is a small piece of metal that looks like mesh or a spring. Stent placement may be done as treatment for a heart attack or right after a coronary angiogram in which a blocked artery is found. Let your health care provider know about:  Any allergies you have.  All medicines you are taking, including vitamins, herbs, eye drops, creams, and over-the-counter medicines.  Any problems you or family  members have had with anesthetic medicines.  Any blood disorders you have.  Any surgeries you have had.  Any medical conditions you have.  Whether you are pregnant or may be pregnant. What are the risks? Generally, this is a safe procedure. However, problems may occur, including:  Damage to the heart or its blood vessels.  A return of blockage.  Bleeding,  infection, or bruising at the insertion site.  A collection of blood under the skin (hematoma) at the insertion site.  A blood clot in another part of the body.  Kidney injury.  Allergic reaction to the dye or contrast that is used.  Bleeding into the abdomen (retroperitoneal bleeding).  What happens before the procedure? Staying hydrated Follow instructions from your health care provider about hydration, which may include:  Up to 2 hours before the procedure - you may continue to drink clear liquids, such as water, clear fruit juice, black coffee, and plain tea.  Eating and drinking restrictions Follow instructions from your health care provider about eating and drinking, which may include:  8 hours before the procedure - stop eating heavy meals or foods such as meat, fried foods, or fatty foods.  6 hours before the procedure - stop eating light meals or foods, such as toast or cereal.  2 hours before the procedure - stop drinking clear liquids.  Ask your health care provider about:  Changing or stopping your regular medicines. This is especially important if you are taking diabetes medicines or blood thinners.  Taking medicines such as ibuprofen. These medicines can thin your blood. Do not take these medicines before your procedure if your health care provider instructs you not to. Generally, aspirin is recommended before a procedure of passing a small, thin tube (catheter) through a blood vessel and into the heart (cardiac catheterization).  What happens during the procedure?  An IV tube will be inserted into one of your veins.  You will be given one or more of the following: ? A medicine to help you relax (sedative). ? A medicine to numb the area where the catheter will be inserted into an artery (local anesthetic).  To reduce your risk of infection: ? Your health care team will wash or sanitize their hands. ? Your skin will be washed with soap. ? Hair may be removed  from the area where the catheter will be inserted.  Using a guide wire, the catheter will be inserted into an artery. The location may be in your groin, in your wrist, or in the fold of your arm (near your elbow).  A type of X-ray (fluoroscopy) will be used to help guide the catheter to the opening of the arteries in the heart.  A dye will be injected into the catheter, and X-rays will be taken. The dye will help to show where any narrowing or blockages are located in the arteries.  A tiny wire will be guided to the blocked spot, and a balloon will be inflated to make the artery wider.  The stent will be expanded and will crush the plaques into the wall of the vessel. The stent will hold the area open and improve the blood flow. Most stents have a drug coating to reduce the risk of the stent narrowing over time.  The artery may be made wider using a drill, laser, or other tools to remove plaques.  When the blood flow is better, the catheter will be removed. The lining of the artery will grow over the stent,  which stays where it was placed. This procedure may vary among health care providers and hospitals. What happens after the procedure?  If the procedure is done through the leg, you will be kept in bed lying flat for about 6 hours. You will be instructed to not bend and not cross your legs.  The insertion site will be checked frequently.  The pulse in your foot or wrist will be checked frequently.  You may have additional blood tests, X-rays, and a test that records the electrical activity of your heart (electrocardiogram, or ECG). This information is not intended to replace advice given to you by your health care provider. Make sure you discuss any questions you have with your health care provider. Document Released: 09/07/2002 Document Revised: 11/01/2015 Document Reviewed: 10/07/2015 Elsevier Interactive Patient Education  [employer].
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
## 2017-11-03 DIAGNOSIS — N183 Chronic kidney disease, stage 3 (moderate): Secondary | ICD-10-CM | POA: Diagnosis not present

## 2017-11-03 DIAGNOSIS — Z01818 Encounter for other preprocedural examination: Secondary | ICD-10-CM | POA: Diagnosis not present

## 2017-11-03 DIAGNOSIS — I10 Essential (primary) hypertension: Secondary | ICD-10-CM | POA: Diagnosis not present

## 2017-11-04 LAB — BASIC METABOLIC PANEL
BUN / CREAT RATIO: 16 (ref 12–28)
BUN: 22 mg/dL (ref 8–27)
CALCIUM: 10.4 mg/dL — AB (ref 8.7–10.3)
CHLORIDE: 103 mmol/L (ref 96–106)
CO2: 22 mmol/L (ref 20–29)
Creatinine, Ser: 1.41 mg/dL — ABNORMAL HIGH (ref 0.57–1.00)
GFR calc non Af Amer: 38 mL/min/{1.73_m2} — ABNORMAL LOW (ref >59)
GFR, EST AFRICAN AMERICAN: 43 mL/min/{1.73_m2} — AB (ref >59)
Glucose: 179 mg/dL — ABNORMAL HIGH (ref 65–99)
POTASSIUM: 4.4 mmol/L (ref 3.5–5.2)
Sodium: 140 mmol/L (ref 134–144)

## 2017-11-04 LAB — CBC
Hematocrit: 36.2 % (ref 34.0–46.6)
Hemoglobin: 12.1 g/dL (ref 11.1–15.9)
MCH: 29.1 pg (ref 26.6–33.0)
MCHC: 33.4 g/dL (ref 31.5–35.7)
MCV: 87 fL (ref 79–97)
PLATELETS: 198 10*3/uL (ref 150–450)
RBC: 4.16 x10E6/uL (ref 3.77–5.28)
RDW: 14.1 % (ref 12.3–15.4)
WBC: 4.1 10*3/uL (ref 3.4–10.8)

## 2017-11-05 ENCOUNTER — Telehealth: Payer: Self-pay | Admitting: *Deleted

## 2017-11-05 NOTE — Telephone Encounter (Signed)
Pt contacted pre-catheterization scheduled at [facility] for: Monday November 09, 2017 10:30 AM Verified arrival time and place: [place] En[facility] A at: 6 AM for pre-procedure hydration  No solid food after midnight prior to cath, clear liquids until 5 AM day of procedure. Verified allergies in Epic  Hold: Lisinopril-day before and day of procedure. Glimepiride -AM of procedure.  Except hold medications AM meds can be  taken pre-cath with sip of water including: ASA 81 mg Effient 10 mg  Confirmed patient has responsible person to drive home post procedure and for 24 hours after you arrive home: yes

## 2017-11-06 DIAGNOSIS — N959 Unspecified menopausal and perimenopausal disorder: Secondary | ICD-10-CM | POA: Diagnosis not present

## 2017-11-06 DIAGNOSIS — Z1231 Encounter for screening mammogram for malignant neoplasm of breast: Secondary | ICD-10-CM | POA: Diagnosis not present

## 2017-11-09 ENCOUNTER — Encounter (HOSPITAL_COMMUNITY)
Admission: RE | Disposition: A | Discharge status: Home / Self Care | Payer: Self-pay | Source: Ambulatory Visit | Attending: Cardiology

## 2017-11-09 ENCOUNTER — Ambulatory Visit (HOSPITAL_COMMUNITY)
Admission: RE | Admit: 2017-11-09 | Discharge: 2017-11-09 | Disposition: A | Discharge status: Home / Self Care | Payer: PPO | Source: Ambulatory Visit | Attending: Cardiology | Admitting: Cardiology

## 2017-11-09 ENCOUNTER — Other Ambulatory Visit: Payer: Self-pay

## 2017-11-09 DIAGNOSIS — Z7982 Long term (current) use of aspirin: Secondary | ICD-10-CM | POA: Insufficient documentation

## 2017-11-09 DIAGNOSIS — I252 Old myocardial infarction: Secondary | ICD-10-CM | POA: Diagnosis not present

## 2017-11-09 DIAGNOSIS — G2581 Restless legs syndrome: Secondary | ICD-10-CM | POA: Diagnosis not present

## 2017-11-09 DIAGNOSIS — Z888 Allergy status to other drugs, medicaments and biological substances status: Secondary | ICD-10-CM | POA: Diagnosis not present

## 2017-11-09 DIAGNOSIS — Z86010 Personal history of colonic polyps: Secondary | ICD-10-CM | POA: Diagnosis not present

## 2017-11-09 DIAGNOSIS — K219 Gastro-esophageal reflux disease without esophagitis: Secondary | ICD-10-CM | POA: Diagnosis not present

## 2017-11-09 DIAGNOSIS — Z9861 Coronary angioplasty status: Secondary | ICD-10-CM

## 2017-11-09 DIAGNOSIS — Z833 Family history of diabetes mellitus: Secondary | ICD-10-CM | POA: Insufficient documentation

## 2017-11-09 DIAGNOSIS — Z90710 Acquired absence of both cervix and uterus: Secondary | ICD-10-CM | POA: Insufficient documentation

## 2017-11-09 DIAGNOSIS — I129 Hypertensive chronic kidney disease with stage 1 through stage 4 chronic kidney disease, or unspecified chronic kidney disease: Secondary | ICD-10-CM | POA: Insufficient documentation

## 2017-11-09 DIAGNOSIS — Z955 Presence of coronary angioplasty implant and graft: Secondary | ICD-10-CM | POA: Diagnosis not present

## 2017-11-09 DIAGNOSIS — I25110 Atherosclerotic heart disease of native coronary artery with unstable angina pectoris: Secondary | ICD-10-CM | POA: Insufficient documentation

## 2017-11-09 DIAGNOSIS — N183 Chronic kidney disease, stage 3 unspecified: Secondary | ICD-10-CM

## 2017-11-09 DIAGNOSIS — Z79899 Other long term (current) drug therapy: Secondary | ICD-10-CM | POA: Diagnosis not present

## 2017-11-09 DIAGNOSIS — E785 Hyperlipidemia, unspecified: Secondary | ICD-10-CM | POA: Diagnosis present

## 2017-11-09 DIAGNOSIS — I25119 Atherosclerotic heart disease of native coronary artery with unspecified angina pectoris: Secondary | ICD-10-CM | POA: Diagnosis present

## 2017-11-09 DIAGNOSIS — Z7984 Long term (current) use of oral hypoglycemic drugs: Secondary | ICD-10-CM | POA: Insufficient documentation

## 2017-11-09 DIAGNOSIS — Z905 Acquired absence of kidney: Secondary | ICD-10-CM | POA: Diagnosis not present

## 2017-11-09 DIAGNOSIS — I10 Essential (primary) hypertension: Secondary | ICD-10-CM | POA: Diagnosis present

## 2017-11-09 DIAGNOSIS — D509 Iron deficiency anemia, unspecified: Secondary | ICD-10-CM | POA: Diagnosis not present

## 2017-11-09 DIAGNOSIS — I200 Unstable angina: Secondary | ICD-10-CM | POA: Diagnosis present

## 2017-11-09 DIAGNOSIS — M810 Age-related osteoporosis without current pathological fracture: Secondary | ICD-10-CM | POA: Insufficient documentation

## 2017-11-09 DIAGNOSIS — E119 Type 2 diabetes mellitus without complications: Secondary | ICD-10-CM

## 2017-11-09 DIAGNOSIS — E782 Mixed hyperlipidemia: Secondary | ICD-10-CM | POA: Diagnosis not present

## 2017-11-09 DIAGNOSIS — E1122 Type 2 diabetes mellitus with diabetic chronic kidney disease: Secondary | ICD-10-CM | POA: Insufficient documentation

## 2017-11-09 DIAGNOSIS — Z9889 Other specified postprocedural states: Secondary | ICD-10-CM | POA: Diagnosis not present

## 2017-11-09 DIAGNOSIS — Z885 Allergy status to narcotic agent status: Secondary | ICD-10-CM | POA: Diagnosis not present

## 2017-11-09 HISTORY — PX: CORONARY BALLOON ANGIOPLASTY: CATH118233

## 2017-11-09 HISTORY — PX: LEFT HEART CATH AND CORONARY ANGIOGRAPHY: CATH118249

## 2017-11-09 LAB — GLUCOSE, CAPILLARY
Glucose-Capillary: 105 mg/dL — ABNORMAL HIGH (ref 70–99)
Glucose-Capillary: 113 mg/dL — ABNORMAL HIGH (ref 70–99)

## 2017-11-09 LAB — POCT ACTIVATED CLOTTING TIME: Activated Clotting Time: 301 seconds

## 2017-11-09 SURGERY — LEFT HEART CATH AND CORONARY ANGIOGRAPHY
Anesthesia: LOCAL

## 2017-11-09 MED ORDER — ONDANSETRON HCL 4 MG/2ML IJ SOLN: 4.0000 mg | Freq: Four times a day (QID) | INTRAMUSCULAR | Status: DC | PRN

## 2017-11-09 MED ORDER — HEPARIN SODIUM (PORCINE) 1000 UNIT/ML IJ SOLN
INTRAMUSCULAR | Status: DC | PRN
  Administered 2017-11-09 (×2): 4000 [IU] via INTRAVENOUS

## 2017-11-09 MED ORDER — SODIUM CHLORIDE 0.9 % WEIGHT BASED INFUSION
1.0000 mL/kg/h | INTRAVENOUS | Status: DC
  Administered 2017-11-09: 1 mL/kg/h via INTRAVENOUS

## 2017-11-09 MED ORDER — SODIUM CHLORIDE 0.9% FLUSH: 3.0000 mL | Freq: Two times a day (BID) | INTRAVENOUS | Status: DC

## 2017-11-09 MED ORDER — ANGIOPLASTY BOOK
Freq: Once | Status: DC
  Filled 2017-11-09: qty 1

## 2017-11-09 MED ORDER — LABETALOL HCL 5 MG/ML IV SOLN: 10.0000 mg | INTRAVENOUS | Status: AC | PRN

## 2017-11-09 MED ORDER — SODIUM CHLORIDE 0.9 % WEIGHT BASED INFUSION: 1.0000 mL/kg/h | INTRAVENOUS | Status: DC

## 2017-11-09 MED ORDER — VERAPAMIL HCL 2.5 MG/ML IV SOLN
INTRAVENOUS | Status: AC
  Filled 2017-11-09: qty 2

## 2017-11-09 MED ORDER — NITROGLYCERIN 1 MG/10 ML FOR IR/CATH LAB
INTRA_ARTERIAL | Status: DC | PRN
  Administered 2017-11-09 (×2): 200 ug

## 2017-11-09 MED ORDER — MIDAZOLAM HCL 2 MG/2ML IJ SOLN
INTRAMUSCULAR | Status: AC
  Filled 2017-11-09: qty 2

## 2017-11-09 MED ORDER — FENTANYL CITRATE (PF) 100 MCG/2ML IJ SOLN
INTRAMUSCULAR | Status: AC
  Filled 2017-11-09: qty 2

## 2017-11-09 MED ORDER — SODIUM CHLORIDE 0.9% FLUSH: 3.0000 mL | INTRAVENOUS | Status: DC | PRN

## 2017-11-09 MED ORDER — HYDRALAZINE HCL 20 MG/ML IJ SOLN: 5.0000 mg | INTRAMUSCULAR | Status: AC | PRN

## 2017-11-09 MED ORDER — HEPARIN (PORCINE) IN NACL 1000-0.9 UT/500ML-% IV SOLN
INTRAVENOUS | Status: AC
  Filled 2017-11-09: qty 1000

## 2017-11-09 MED ORDER — LIDOCAINE HCL (PF) 1 % IJ SOLN
INTRAMUSCULAR | Status: AC
  Filled 2017-11-09: qty 30

## 2017-11-09 MED ORDER — ACETAMINOPHEN 325 MG PO TABS: 650.0000 mg | ORAL_TABLET | ORAL | Status: DC | PRN

## 2017-11-09 MED ORDER — FENTANYL CITRATE (PF) 100 MCG/2ML IJ SOLN
INTRAMUSCULAR | Status: DC | PRN
  Administered 2017-11-09: 25 ug via INTRAVENOUS

## 2017-11-09 MED ORDER — SODIUM CHLORIDE 0.9 % IV SOLN: 250.0000 mL | INTRAVENOUS | Status: DC | PRN

## 2017-11-09 MED ORDER — SODIUM CHLORIDE 0.9 % WEIGHT BASED INFUSION
3.0000 mL/kg/h | INTRAVENOUS | Status: DC
  Administered 2017-11-09: 3 mL/kg/h via INTRAVENOUS

## 2017-11-09 MED ORDER — HEPARIN (PORCINE) IN NACL 1000-0.9 UT/500ML-% IV SOLN
INTRAVENOUS | Status: DC | PRN
  Administered 2017-11-09: 500 mL

## 2017-11-09 MED ORDER — HEPARIN SODIUM (PORCINE) 1000 UNIT/ML IJ SOLN
INTRAMUSCULAR | Status: AC
  Filled 2017-11-09: qty 1

## 2017-11-09 MED ORDER — MIDAZOLAM HCL 2 MG/2ML IJ SOLN
INTRAMUSCULAR | Status: DC | PRN
  Administered 2017-11-09: 1 mg via INTRAVENOUS

## 2017-11-09 MED ORDER — LIDOCAINE HCL (PF) 1 % IJ SOLN
INTRAMUSCULAR | Status: DC | PRN
  Administered 2017-11-09: 2 mL

## 2017-11-09 MED ORDER — NITROGLYCERIN 1 MG/10 ML FOR IR/CATH LAB
INTRA_ARTERIAL | Status: AC
  Filled 2017-11-09: qty 10

## 2017-11-09 MED ORDER — ASPIRIN 81 MG PO CHEW: 81.0000 mg | CHEWABLE_TABLET | ORAL | Status: DC

## 2017-11-09 MED ORDER — VERAPAMIL HCL 2.5 MG/ML IV SOLN
INTRAVENOUS | Status: DC | PRN
  Administered 2017-11-09: 11:00:00 via INTRA_ARTERIAL

## 2017-11-09 MED ORDER — IOHEXOL 350 MG/ML SOLN
INTRAVENOUS | Status: DC | PRN
  Administered 2017-11-09: 80 mL via INTRAVENOUS

## 2017-11-09 SURGICAL SUPPLY — 19 items
BALLN EMERGE MR 2.25X12 (BALLOONS) ×2
BALLN WOLVERINE 3.00X6 (BALLOONS) ×2
BALLOON EMERGE MR 2.25X12 (BALLOONS) ×1 IMPLANT
BALLOON WOLVERINE 3.00X6 (BALLOONS) ×1 IMPLANT
CATH IMPULSE 5F ANG/FL3.5 (CATHETERS) ×2 IMPLANT
CATH VISTA GUIDE 6FR JR4 (CATHETERS) ×2 IMPLANT
DEVICE RAD COMP TR BAND LRG (VASCULAR PRODUCTS) ×2 IMPLANT
GLIDESHEATH SLEND SS 6F .021 (SHEATH) ×2 IMPLANT
GUIDEWIRE INQWIRE 1.5J.035X260 (WIRE) ×1 IMPLANT
INQWIRE 1.5J .035X260CM (WIRE) ×2
KIT ENCORE 26 ADVANTAGE (KITS) ×2 IMPLANT
KIT HEART LEFT (KITS) ×2 IMPLANT
KIT HEMO VALVE WATCHDOG (MISCELLANEOUS) ×2 IMPLANT
PACK CARDIAC CATHETERIZATION (CUSTOM PROCEDURE TRAY) ×2 IMPLANT
SHEATH PROBE COVER 6X72 (BAG) ×2 IMPLANT
TRANSDUCER W/STOPCOCK (MISCELLANEOUS) ×2 IMPLANT
TUBING CIL FLEX 10 FLL-RA (TUBING) ×2 IMPLANT
WIRE ASAHI PROWATER 180CM (WIRE) ×2 IMPLANT
WIRE HI TORQ VERSACORE-J 145CM (WIRE) ×2 IMPLANT
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
## 2017-11-09 NOTE — Interval H&P Note (Signed)
History and Physical Interval Note:  11/09/2017 11:10 AM  Julia Logan  has presented today for surgery, with the diagnosis of stenosis  The various methods of treatment have been discussed with the patient and family. After consideration of risks, benefits and other options for treatment, the patient has consented to  Procedure(s): LEFT HEART CATH AND CORONARY ANGIOGRAPHY (N/A) as a surgical intervention .  The patient's history has been reviewed, patient examined, no change in status, stable for surgery.  I have reviewed the patient's chart and labs.  Questions were answered to the patient's satisfaction.   Cath Lab Visit (complete for each Cath Lab visit)  Clinical Evaluation Leading to the Procedure:   ACS: Yes.    Non-ACS:    Anginal Classification: CCS III  Anti-ischemic medical therapy: Maximal Therapy (2 or more classes of medications)  Non-Invasive Test Results: No non-invasive testing performed  Prior CABG: No previous CABG        Julia Kuhlman [place] MD,FACC 11/09/2017 11:10 AM

## 2017-11-09 NOTE — Progress Notes (Signed)
1320-1400 Discussed with pt the importance of effient with PCI. Reviewed NTG use, ex ed, gave diabetic and heart healthy diets, and CRP 2. Pt has attended CRP 2 before. Will refer to [facility] program. Family in room. Graylon Good RN BSN 11/09/2017 1:57 PM

## 2017-11-09 NOTE — Progress Notes (Signed)
Up and walked and tolerated well; no c/o chest discomfort or shortness of breath

## 2017-11-09 NOTE — Discharge Instructions (Signed)
Radial Site Care Refer to this sheet in the next few weeks. These instructions provide you with information about caring for yourself after your procedure. Your health care provider may also give you more specific instructions. Your treatment has been planned according to current medical practices, but problems sometimes occur. Call your health care provider if you have any problems or questions after your procedure. What can I expect after the procedure? After your procedure, it is typical to have the following:  Bruising at the radial site that usually fades within 1-2 weeks.  Blood collecting in the tissue (hematoma) that may be painful to the touch. It should usually decrease in size and tenderness within 1-2 weeks.  Follow these instructions at home:  Take medicines only as directed by your health care provider.  You may shower 24-48 hours after the procedure or as directed by your health care provider. Remove the bandage (dressing) and gently wash the site with plain soap and water. Pat the area dry with a clean towel. Do not rub the site, because this may cause bleeding.  Do not take baths, swim, or use a hot tub until your health care provider approves.  Check your insertion site every day for redness, swelling, or drainage.  Do not apply powder or lotion to the site.  Do not flex or bend the affected arm for 24 hours or as directed by your health care provider.  Do not push or pull heavy objects with the affected arm for 24 hours or as directed by your health care provider.  Do not lift over 10 lb (4.5 kg) for 5 days after your procedure or as directed by your health care provider.  Ask your health care provider when it is okay to: ? Return to work or school. ? Resume usual physical activities or sports. ? Resume sexual activity.  Do not drive home if you are discharged the same day as the procedure. Have someone else drive you.  You may drive 24 hours after the procedure  unless otherwise instructed by your health care provider.  Do not operate machinery or power tools for 24 hours after the procedure.  If your procedure was done as an outpatient procedure, which means that you went home the same day as your procedure, a responsible adult should be with you for the first 24 hours after you arrive home.  Keep all follow-up visits as directed by your health care provider. This is important. Contact a health care provider if:  You have a fever.  You have chills.  You have increased bleeding from the radial site. Hold pressure on the site. Get help right away if:  You have unusual pain at the radial site.  You have redness, warmth, or swelling at the radial site.  You have drainage (other than a small amount of blood on the dressing) from the radial site.  The radial site is bleeding, and the bleeding does not stop after 30 minutes of holding steady pressure on the site.  Your arm or hand becomes pale, cool, tingly, or numb. This information is not intended to replace advice given to you by your health care provider. Make sure you discuss any questions you have with your health care provider. Document Released: 04/05/2010 Document Revised: 08/09/2015 Document Reviewed: 09/19/2013 Elsevier Interactive Patient Education  2018 [place]. Moderate Conscious Sedation, Adult, Care After These instructions provide you with information about caring for yourself after your procedure. Your health care provider may also give  you more specific instructions. Your treatment has been planned according to current medical practices, but problems sometimes occur. Call your health care provider if you have any problems or questions after your procedure. What can I expect after the procedure? After your procedure, it is common:  To feel sleepy for several hours.  To feel clumsy and have poor balance for several hours.  To have poor judgment for several hours.  To  vomit if you eat too soon.  Follow these instructions at home: For at least 24 hours after the procedure:   Do not: ? Participate in activities where you could fall or become injured. ? Drive. ? Use heavy machinery. ? Drink alcohol. ? Take sleeping pills or medicines that cause drowsiness. ? Make important decisions or sign legal documents. ? Take care of children on your own.  Rest. Eating and drinking  Follow the diet recommended by your health care provider.  If you vomit: ? Drink water, juice, or soup when you can drink without vomiting. ? Make sure you have little or no nausea before eating solid foods. General instructions  Have a responsible adult stay with you until you are awake and alert.  Take over-the-counter and prescription medicines only as told by your health care provider.  If you smoke, do not smoke without supervision.  Keep all follow-up visits as told by your health care provider. This is important. Contact a health care provider if:  You keep feeling nauseous or you keep vomiting.  You feel light-headed.  You develop a rash.  You have a fever. Get help right away if:  You have trouble breathing. This information is not intended to replace advice given to you by your health care provider. Make sure you discuss any questions you have with your health care provider. Document Released: 12/22/2012 Document Revised: 08/06/2015 Document Reviewed: 06/23/2015 Elsevier Interactive Patient Education  2018 [place].    Coronary Angioplasty, Care After This sheet gives you information about how to care for yourself after your procedure. Your health care provider may also give you more specific instructions. If you have problems or questions, contact your health care provider. What can I expect after the procedure? After your procedure, it is common to have:  Bruising at the catheter insertion site. This usually fades within 1-2 weeks.  Blood  collecting in the tissue (hematoma) that may be painful to the touch. It should become smaller and less tender within 1-2 weeks.  Follow these instructions at home: Medicines  Take over-the-counter and prescription medicines only as told by your health care provider.  Blood thinners may be prescribed after your procedure to improve blood flow. Bathing  You may shower 24-48 hours after the procedure or as told by your health care provider.  Do not take baths, swim, or use a hot tub until your health care provider approves. Insertion site care  Follow instructions from your health care provider about how to take care of your insertion site. Make sure you: ? Wash your hands with soap and water before you change your bandage (dressing). If soap and water are not available, use hand sanitizer. ? Change your dressing as told by your health care provider. ? Gently wash the site with plain soap and water. ? Use a clean towel to pat the area dry. ? Do not rub the site, because this may cause bleeding. ? Do not apply powder or lotion to the site.  Check your insertion site every day for signs  of infection. Check for: ? More redness, swelling, or pain. ? More fluid or blood. ? Warmth. ? Pus or a bad smell. Lifestyle  Make any lifestyle changes as recommended by your health care provider. This may include: ? Not using any products that contain nicotine or tobacco, such as cigarettes and e-cigarettes. If you need help quitting, ask your health care provider. ? Managing your weight. ? Getting regular exercise. ? Managing your blood pressure. ? Limiting your alcohol intake. ? Managing other health problems, such as diabetes.  Eat a heart-healthy diet. This should include plenty of fresh fruits and vegetables. Avoid foods that are: ? High in salt (sodium). ? Canned or highly processed. ? High in saturated fat or sugar. ? Fried. General instructions  Do not lift over 10 lb (4.5 kg) for 5  days after your procedure or as told by your health care provider.  Ask your health care provider when it is okay to: ? Return to work or school. ? Resume usual physical activities or sports. ? Resume sexual activity.  Keep all follow-up visits as told by your health care provider. This is important. Contact a health care provider if:  You have a fever.  You have chills.  You have increased bleeding from the insertion site. Hold pressure on the site. Get help right away if:  You develop chest pain or shortness of breath, feel faint, or pass out.  You have unusual pain at the insertion site.  You have redness, warmth, or swelling at the insertion site.  You have drainage (other than a small amount of blood on the dressing) from the insertion site.  The insertion site is bleeding, and the bleeding does not stop after 30 minutes of holding steady pressure on the site.  You develop bleeding from any other place, such as from the rectum. There may be bright red blood in your urine or stool, or it may appear as black, tarry stool. This information is not intended to replace advice given to you by your health care provider. Make sure you discuss any questions you have with your health care provider. Document Released: 09/19/2004 Document Revised: 06/30/2016 Document Reviewed: 10/07/2015 Elsevier Interactive Patient Education  [employer].

## 2017-11-10 ENCOUNTER — Encounter (HOSPITAL_COMMUNITY): Payer: Self-pay | Admitting: Cardiology

## 2017-11-13 DIAGNOSIS — E785 Hyperlipidemia, unspecified: Secondary | ICD-10-CM | POA: Diagnosis not present

## 2017-11-13 DIAGNOSIS — D509 Iron deficiency anemia, unspecified: Secondary | ICD-10-CM | POA: Diagnosis not present

## 2017-11-13 DIAGNOSIS — I2510 Atherosclerotic heart disease of native coronary artery without angina pectoris: Secondary | ICD-10-CM | POA: Diagnosis not present

## 2017-11-13 DIAGNOSIS — F329 Major depressive disorder, single episode, unspecified: Secondary | ICD-10-CM | POA: Diagnosis not present

## 2017-11-13 DIAGNOSIS — E1129 Type 2 diabetes mellitus with other diabetic kidney complication: Secondary | ICD-10-CM | POA: Diagnosis not present

## 2017-11-17 DIAGNOSIS — Z955 Presence of coronary angioplasty implant and graft: Secondary | ICD-10-CM | POA: Diagnosis not present
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
## 2017-11-25 ENCOUNTER — Ambulatory Visit (INDEPENDENT_AMBULATORY_CARE_PROVIDER_SITE_OTHER): Payer: PPO | Admitting: Cardiology

## 2017-11-25 ENCOUNTER — Encounter: Payer: Self-pay | Admitting: Cardiology

## 2017-11-25 VITALS — BP 122/62 | HR 64 | Ht 61.0 in | Wt 174.0 lb

## 2017-11-25 DIAGNOSIS — E782 Mixed hyperlipidemia: Secondary | ICD-10-CM

## 2017-11-25 DIAGNOSIS — I10 Essential (primary) hypertension: Secondary | ICD-10-CM

## 2017-11-25 DIAGNOSIS — I25119 Atherosclerotic heart disease of native coronary artery with unspecified angina pectoris: Secondary | ICD-10-CM | POA: Diagnosis not present

## 2017-11-25 NOTE — Patient Instructions (Signed)
Medication Instructions:  Your physician recommends that you continue on your current medications as directed. Please refer to the Current Medication list given to you today.   Labwork: None  Testing/Procedures: None  Follow-Up: Your physician wants you to follow-up in: 6 months. You will receive a reminder letter in the mail two months in advance. If you don't receive a letter, please call our office to schedule the follow-up appointment.   If you need a refill on your cardiac medications before your next appointment, please call your pharmacy.   Thank you for choosing [facility]! Devanie Galanti, RN [phone]    

## 2017-12-07 ENCOUNTER — Ambulatory Visit: Payer: PPO | Admitting: Cardiology

## 2017-12-16 DIAGNOSIS — Z955 Presence of coronary angioplasty implant and graft: Secondary | ICD-10-CM | POA: Diagnosis not present

## 2017-12-19 ENCOUNTER — Other Ambulatory Visit: Payer: Self-pay | Admitting: Student

## 2017-12-19 MED ORDER — METOPROLOL TARTRATE 25 MG PO TABS: 12.5000 mg | ORAL_TABLET | Freq: Two times a day (BID) | ORAL | 3 refills | Status: DC

## 2018-01-18 DIAGNOSIS — Z955 Presence of coronary angioplasty implant and graft: Secondary | ICD-10-CM | POA: Diagnosis not present

## 2018-02-19 DIAGNOSIS — I2510 Atherosclerotic heart disease of native coronary artery without angina pectoris: Secondary | ICD-10-CM | POA: Diagnosis not present

## 2018-02-19 DIAGNOSIS — M8589 Other specified disorders of bone density and structure, multiple sites: Secondary | ICD-10-CM | POA: Diagnosis not present

## 2018-02-19 DIAGNOSIS — F329 Major depressive disorder, single episode, unspecified: Secondary | ICD-10-CM | POA: Diagnosis not present

## 2018-02-19 DIAGNOSIS — E785 Hyperlipidemia, unspecified: Secondary | ICD-10-CM | POA: Diagnosis not present

## 2018-02-19 DIAGNOSIS — E1129 Type 2 diabetes mellitus with other diabetic kidney complication: Secondary | ICD-10-CM | POA: Diagnosis not present

## 2018-02-19 DIAGNOSIS — D509 Iron deficiency anemia, unspecified: Secondary | ICD-10-CM | POA: Diagnosis not present

## 2018-03-30 ENCOUNTER — Telehealth: Payer: Self-pay | Admitting: Cardiology

## 2018-03-30 MED ORDER — PRASUGREL HCL 10 MG PO TABS: ORAL_TABLET | ORAL | 0 refills | Status: DC

## 2018-03-30 NOTE — Telephone Encounter (Signed)
Refill sent to [facility] in [place] as requested.

## 2018-03-30 NOTE — Addendum Note (Signed)
Addended by: Austin Miles on: 03/30/2018 12:09 PM   Modules accepted: Orders

## 2018-03-30 NOTE — Telephone Encounter (Signed)
Call effient to [facility] on [address]

## 2018-04-19 DIAGNOSIS — M8589 Other specified disorders of bone density and structure, multiple sites: Secondary | ICD-10-CM | POA: Diagnosis not present

## 2018-04-19 DIAGNOSIS — M859 Disorder of bone density and structure, unspecified: Secondary | ICD-10-CM | POA: Diagnosis not present

## 2018-05-18 DIAGNOSIS — I161 Hypertensive emergency: Secondary | ICD-10-CM | POA: Diagnosis not present

## 2018-05-18 DIAGNOSIS — R079 Chest pain, unspecified: Secondary | ICD-10-CM | POA: Diagnosis not present

## 2018-05-18 DIAGNOSIS — I700 Atherosclerosis of aorta: Secondary | ICD-10-CM | POA: Diagnosis not present

## 2018-05-18 DIAGNOSIS — N3000 Acute cystitis without hematuria: Secondary | ICD-10-CM | POA: Diagnosis not present

## 2018-05-18 DIAGNOSIS — R42 Dizziness and giddiness: Secondary | ICD-10-CM | POA: Diagnosis not present

## 2018-05-18 DIAGNOSIS — R0789 Other chest pain: Secondary | ICD-10-CM | POA: Diagnosis not present

## 2018-05-19 DIAGNOSIS — I160 Hypertensive urgency: Secondary | ICD-10-CM | POA: Diagnosis not present

## 2018-05-19 DIAGNOSIS — I2510 Atherosclerotic heart disease of native coronary artery without angina pectoris: Secondary | ICD-10-CM | POA: Diagnosis not present

## 2018-05-19 DIAGNOSIS — I129 Hypertensive chronic kidney disease with stage 1 through stage 4 chronic kidney disease, or unspecified chronic kidney disease: Secondary | ICD-10-CM | POA: Diagnosis not present

## 2018-05-19 DIAGNOSIS — N390 Urinary tract infection, site not specified: Secondary | ICD-10-CM | POA: Diagnosis not present

## 2018-05-19 DIAGNOSIS — Z955 Presence of coronary angioplasty implant and graft: Secondary | ICD-10-CM | POA: Diagnosis not present

## 2018-05-19 DIAGNOSIS — I209 Angina pectoris, unspecified: Secondary | ICD-10-CM | POA: Diagnosis not present

## 2018-05-19 DIAGNOSIS — R0902 Hypoxemia: Secondary | ICD-10-CM | POA: Diagnosis not present

## 2018-05-19 DIAGNOSIS — I700 Atherosclerosis of aorta: Secondary | ICD-10-CM | POA: Diagnosis not present

## 2018-05-19 DIAGNOSIS — I25119 Atherosclerotic heart disease of native coronary artery with unspecified angina pectoris: Secondary | ICD-10-CM | POA: Diagnosis not present

## 2018-05-19 DIAGNOSIS — R0789 Other chest pain: Secondary | ICD-10-CM | POA: Diagnosis not present

## 2018-05-19 DIAGNOSIS — E1122 Type 2 diabetes mellitus with diabetic chronic kidney disease: Secondary | ICD-10-CM | POA: Diagnosis not present

## 2018-05-19 DIAGNOSIS — E860 Dehydration: Secondary | ICD-10-CM | POA: Diagnosis not present

## 2018-05-19 DIAGNOSIS — E669 Obesity, unspecified: Secondary | ICD-10-CM | POA: Diagnosis not present

## 2018-05-19 DIAGNOSIS — I249 Acute ischemic heart disease, unspecified: Secondary | ICD-10-CM | POA: Diagnosis not present

## 2018-05-19 DIAGNOSIS — K219 Gastro-esophageal reflux disease without esophagitis: Secondary | ICD-10-CM | POA: Diagnosis not present

## 2018-05-19 DIAGNOSIS — N183 Chronic kidney disease, stage 3 (moderate): Secondary | ICD-10-CM | POA: Diagnosis not present

## 2018-05-19 DIAGNOSIS — M1990 Unspecified osteoarthritis, unspecified site: Secondary | ICD-10-CM | POA: Diagnosis not present

## 2018-05-19 DIAGNOSIS — Z6837 Body mass index (BMI) 37.0-37.9, adult: Secondary | ICD-10-CM | POA: Diagnosis not present

## 2018-05-19 DIAGNOSIS — E785 Hyperlipidemia, unspecified: Secondary | ICD-10-CM | POA: Diagnosis not present

## 2018-05-19 DIAGNOSIS — E1169 Type 2 diabetes mellitus with other specified complication: Secondary | ICD-10-CM | POA: Diagnosis not present

## 2018-05-19 DIAGNOSIS — R079 Chest pain, unspecified: Secondary | ICD-10-CM | POA: Diagnosis not present

## 2018-05-19 DIAGNOSIS — N3000 Acute cystitis without hematuria: Secondary | ICD-10-CM | POA: Diagnosis not present

## 2018-05-19 DIAGNOSIS — J189 Pneumonia, unspecified organism: Secondary | ICD-10-CM | POA: Diagnosis not present

## 2018-05-19 DIAGNOSIS — R42 Dizziness and giddiness: Secondary | ICD-10-CM | POA: Diagnosis not present

## 2018-05-24 DIAGNOSIS — R112 Nausea with vomiting, unspecified: Secondary | ICD-10-CM | POA: Diagnosis not present

## 2018-05-27 DIAGNOSIS — D509 Iron deficiency anemia, unspecified: Secondary | ICD-10-CM | POA: Diagnosis not present

## 2018-05-27 DIAGNOSIS — E1129 Type 2 diabetes mellitus with other diabetic kidney complication: Secondary | ICD-10-CM | POA: Diagnosis not present

## 2018-05-27 DIAGNOSIS — F329 Major depressive disorder, single episode, unspecified: Secondary | ICD-10-CM | POA: Diagnosis not present

## 2018-05-27 DIAGNOSIS — Z139 Encounter for screening, unspecified: Secondary | ICD-10-CM | POA: Diagnosis not present

## 2018-05-27 DIAGNOSIS — I129 Hypertensive chronic kidney disease with stage 1 through stage 4 chronic kidney disease, or unspecified chronic kidney disease: Secondary | ICD-10-CM | POA: Diagnosis not present

## 2018-05-27 DIAGNOSIS — R1031 Right lower quadrant pain: Secondary | ICD-10-CM | POA: Diagnosis not present

## 2018-05-27 DIAGNOSIS — I2510 Atherosclerotic heart disease of native coronary artery without angina pectoris: Secondary | ICD-10-CM | POA: Diagnosis not present

## 2018-05-27 DIAGNOSIS — E785 Hyperlipidemia, unspecified: Secondary | ICD-10-CM | POA: Diagnosis not present

## 2018-06-14 ENCOUNTER — Telehealth: Payer: Self-pay | Admitting: Cardiology

## 2018-06-14 NOTE — Telephone Encounter (Signed)
TELEPHONE CALL NOTE  Julia Logan has been deemed a candidate for a follow-up tele-health visit to limit community exposure during the Covid-19 pandemic. I spoke with the patient via phone to ensure availability of phone/video source, confirm preferred email & phone number, and discuss instructions and expectations.  I reminded Julia Logan to be prepared with any vital sign and/or heart rhythm information that could potentially be obtained via home monitoring, at the time of her visit. I reminded Julia Logan to expect a phone call at the time of her visit if her visit.  Did the patient verbally acknowledge consent to treatment? YES Janine Limbo 06/14/2018 4:07 PM   CONSENT FOR TELE-HEALTH VISIT - PLEASE REVIEW  I hereby voluntarily request, consent and authorize [facility] and its employed or contracted physicians, physician assistants, nurse practitioners or other licensed health care professionals (the Practitioner), to provide me with telemedicine health care services (the Services") as deemed necessary by the treating Practitioner. I acknowledge and consent to receive the Services by the Practitioner via telemedicine. I understand that the telemedicine visit will involve communicating with the Practitioner through live audiovisual communication technology and the disclosure of certain medical information by electronic transmission. I acknowledge that I have been given the opportunity to request an in-person assessment or other available alternative prior to the telemedicine visit and am voluntarily participating in the telemedicine visit.  I understand that I have the right to withhold or withdraw my consent to the use of telemedicine in the course of my care at any time, without affecting my right to future care or treatment, and that the Practitioner or I may terminate the telemedicine visit at any time. I understand that I have the right to inspect all information obtained  and/or recorded in the course of the telemedicine visit and may receive copies of available information for a reasonable fee.  I understand that some of the potential risks of receiving the Services via telemedicine include:   Delay or interruption in medical evaluation due to technological equipment failure or disruption;  Information transmitted may not be sufficient (e.g. poor resolution of images) to allow for appropriate medical decision making by the Practitioner; and/or   In rare instances, security protocols could fail, causing a breach of personal health information.  Furthermore, I acknowledge that it is my responsibility to provide information about my medical history, conditions and care that is complete and accurate to the best of my ability. I acknowledge that Practitioner's advice, recommendations, and/or decision may be based on factors not within their control, such as incomplete or inaccurate data provided by me or distortions of diagnostic images or specimens that may result from electronic transmissions. I understand that the practice of medicine is not an exact science and that Practitioner makes no warranties or guarantees regarding treatment outcomes. I acknowledge that I will receive a copy of this consent concurrently upon execution via email to the email address I last provided but may also request a printed copy by calling the office of [facility].    I understand that my insurance will be billed for this visit.   I have read or had this consent read to me.  I understand the contents of this consent, which adequately explains the benefits and risks of the Services being provided via telemedicine.   I have been provided ample opportunity to ask questions regarding this consent and the Services and have had my questions answered to my satisfaction.  I give my informed consent for the services to be provided through the use of telemedicine in my medical care  By  participating in this telemedicine visit I agree to the above  YES        Cardiac Questionnaire:    Since your last visit or hospitalization:    1. Have you been having new or worsening chest pain? NO    2. Have you been having new or worsening shortness of breath? YES   3. Have you been having new or worsening leg swelling, wt gain, or increase in abdominal girth (pants fitting more tightly)? NO   4. Have you had any passing out spells? NO     *A YES to any of these questions would result in the appointment being kept.   *If all the answers to these questions are NO, we should indicate that given the current situation regarding the worldwide coronarvirus pandemic, at the recommendation of the CDC, we are looking to limit   gatherings in our waiting area, and thus will reschedule their appointment beyond four weeks from today.   _____________   COVID-19 Pre-Screening Questions:   Do you currently have a fever? NO  Have you recently travelled on a cruise, internationally, or to [place], [place], [place], [place], [place], or [place], [place] [employer]) ? NO  Have you been in contact with someone that is currently pending confirmation of Covid19 testing or has been confirmed to have the [place] virus?  NO Are you currently experiencing fatigue or cough? NO

## 2018-06-14 NOTE — Telephone Encounter (Signed)
Spoke with patient regarding shortness of breath reported. Patient was diagnosed with pneumonia at [facility] on 05/18/2018. [facility] records have been pulled for Dr. Bettina Gavia to review. Patient reports her shortness of breath is mild and improving. Advised patient to keep her scheduled appointment on Wednesday, 06/16/2018, and to contact our office with any further questions or concerns beforehand. Patient verbalized understanding and is agreeable.

## 2018-06-16 ENCOUNTER — Telehealth (INDEPENDENT_AMBULATORY_CARE_PROVIDER_SITE_OTHER): Payer: PPO | Admitting: Cardiology

## 2018-06-16 ENCOUNTER — Encounter: Payer: Self-pay | Admitting: Cardiology

## 2018-06-16 VITALS — BP 126/81 | HR 63 | Ht 61.0 in | Wt 170.4 lb

## 2018-06-16 DIAGNOSIS — E782 Mixed hyperlipidemia: Secondary | ICD-10-CM | POA: Diagnosis not present

## 2018-06-16 DIAGNOSIS — N183 Chronic kidney disease, stage 3 unspecified: Secondary | ICD-10-CM

## 2018-06-16 DIAGNOSIS — R42 Dizziness and giddiness: Secondary | ICD-10-CM | POA: Diagnosis not present

## 2018-06-16 DIAGNOSIS — I10 Essential (primary) hypertension: Secondary | ICD-10-CM

## 2018-06-16 DIAGNOSIS — I129 Hypertensive chronic kidney disease with stage 1 through stage 4 chronic kidney disease, or unspecified chronic kidney disease: Secondary | ICD-10-CM | POA: Diagnosis not present

## 2018-06-16 DIAGNOSIS — I25119 Atherosclerotic heart disease of native coronary artery with unspecified angina pectoris: Secondary | ICD-10-CM

## 2018-06-16 NOTE — Patient Instructions (Signed)
Medication Instructions:  Your physician has recommended you make the following change in your medication:   STOP: amlodipine (Norvasc)   If you need a refill on your cardiac medications before your next appointment, please call your pharmacy.   Lab work: NONE If you have labs (blood work) drawn today and your tests are completely normal, you will receive your results only by: Marland Kitchen MyChart Message (if you have MyChart) OR . A paper copy in the mail If you have any lab test that is abnormal or we need to change your treatment, we will call you to review the results.  Testing/Procedures: NONE  Follow-Up: At [facility], you and your health needs are our priority.  As part of our continuing mission to provide you with exceptional heart care, we have created designated Provider Care Teams.  These Care Teams include your primary Cardiologist (physician) and Advanced Practice Providers (APPs -  Physician Assistants and Nurse Practitioners) who all work together to provide you with the care you need, when you need it. You will need a follow up appointment on November 16, 2018 at 9:20am.  Please arrive at 9:00am

## 2018-06-16 NOTE — Addendum Note (Signed)
Addended by: Stevan Born on: 06/16/2018 03:06 PM   Modules accepted: Orders
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
## 2018-06-16 NOTE — Progress Notes (Signed)
I received and reviewed the records from [facility] admits 05/18/2018 discharge 05/21/2018.  She presented mostly with systemic symptoms of dizziness weakness she has felt to have vasoconstriction of the hands in the emergency room some complaint of nonanginal chest pain and cardiac enzymes EKGs were normal she had a CT in the emergency room that showed bilateral groundglass infiltrates was admitted and treated as community-acquired pneumonia and interestingly her laboratory studies showed lymphopenia 25% creatinine was stable at 1.5 hemoglobin was 12.2 and was discharged from the hospital after antibiotic therapy.

## 2018-06-28 ENCOUNTER — Other Ambulatory Visit: Payer: Self-pay | Admitting: Cardiology

## 2018-06-28 MED ORDER — PRASUGREL HCL 10 MG PO TABS: ORAL_TABLET | ORAL | 1 refills | Status: DC

## 2018-06-28 NOTE — Telephone Encounter (Signed)
°*  STAT* If patient is at the pharmacy, call can be transferred to refill team.   1. Which medications need to be refilled? (please list name of each medication and dose if known) Prasugrel 10MG  2. Which pharmacy/location (including street and city if local pharmacy) is medication to be sent to? [place]  3. Do they need a 30 day or 90 day supply? 90 day

## 2018-06-28 NOTE — Telephone Encounter (Signed)
Prasugrel sent to [facility] on [facility].

## 2018-08-12 DIAGNOSIS — Z1331 Encounter for screening for depression: Secondary | ICD-10-CM | POA: Diagnosis not present

## 2018-08-12 DIAGNOSIS — E669 Obesity, unspecified: Secondary | ICD-10-CM | POA: Diagnosis not present

## 2018-08-12 DIAGNOSIS — Z9181 History of falling: Secondary | ICD-10-CM | POA: Diagnosis not present

## 2018-08-12 DIAGNOSIS — Z1339 Encounter for screening examination for other mental health and behavioral disorders: Secondary | ICD-10-CM | POA: Diagnosis not present

## 2018-08-12 DIAGNOSIS — Z6833 Body mass index (BMI) 33.0-33.9, adult: Secondary | ICD-10-CM | POA: Diagnosis not present

## 2018-08-12 DIAGNOSIS — E785 Hyperlipidemia, unspecified: Secondary | ICD-10-CM | POA: Diagnosis not present

## 2018-08-12 DIAGNOSIS — Z0000 Encounter for general adult medical examination without abnormal findings: Secondary | ICD-10-CM | POA: Diagnosis not present

## 2018-08-12 DIAGNOSIS — Z1231 Encounter for screening mammogram for malignant neoplasm of breast: Secondary | ICD-10-CM | POA: Diagnosis not present

## 2018-08-12 DIAGNOSIS — Z136 Encounter for screening for cardiovascular disorders: Secondary | ICD-10-CM | POA: Diagnosis not present

## 2018-08-27 DIAGNOSIS — E1129 Type 2 diabetes mellitus with other diabetic kidney complication: Secondary | ICD-10-CM | POA: Diagnosis not present

## 2018-08-27 DIAGNOSIS — I2510 Atherosclerotic heart disease of native coronary artery without angina pectoris: Secondary | ICD-10-CM | POA: Diagnosis not present

## 2018-08-27 DIAGNOSIS — F329 Major depressive disorder, single episode, unspecified: Secondary | ICD-10-CM | POA: Diagnosis not present

## 2018-08-27 DIAGNOSIS — D509 Iron deficiency anemia, unspecified: Secondary | ICD-10-CM | POA: Diagnosis not present

## 2018-08-27 DIAGNOSIS — Z9861 Coronary angioplasty status: Secondary | ICD-10-CM | POA: Diagnosis not present

## 2018-08-27 DIAGNOSIS — R809 Proteinuria, unspecified: Secondary | ICD-10-CM | POA: Diagnosis not present

## 2018-08-27 DIAGNOSIS — E1165 Type 2 diabetes mellitus with hyperglycemia: Secondary | ICD-10-CM | POA: Diagnosis not present

## 2018-08-27 DIAGNOSIS — N183 Chronic kidney disease, stage 3 (moderate): Secondary | ICD-10-CM | POA: Diagnosis not present

## 2018-08-27 DIAGNOSIS — I129 Hypertensive chronic kidney disease with stage 1 through stage 4 chronic kidney disease, or unspecified chronic kidney disease: Secondary | ICD-10-CM | POA: Diagnosis not present

## 2018-08-27 DIAGNOSIS — E785 Hyperlipidemia, unspecified: Secondary | ICD-10-CM | POA: Diagnosis not present

## 2018-09-06 ENCOUNTER — Other Ambulatory Visit: Payer: Self-pay | Admitting: *Deleted

## 2018-09-06 ENCOUNTER — Telehealth: Payer: Self-pay | Admitting: Cardiology

## 2018-09-06 MED ORDER — RANOLAZINE ER 1000 MG PO TB12: 1000.0000 mg | ORAL_TABLET | Freq: Two times a day (BID) | ORAL | 3 refills | Status: DC

## 2018-09-06 NOTE — Telephone Encounter (Signed)
°*  STAT* If patient is at the pharmacy, call can be transferred to refill team.   1. Which medications need to be refilled? (please list name of each medication and dose if known) Ranexa (generic) 1000mg  takes 2 daily   2. Which pharmacy/location (including street and city if local pharmacy) is medication to be sent to?[facility] [address] [place]   3. Do they need a 30 day or 90 day supply? 180  Patient see Dr Bettina Gavia not Revankar

## 2018-09-06 NOTE — Telephone Encounter (Signed)
Ranexa refilled.

## 2018-09-06 NOTE — Telephone Encounter (Signed)
°*  STAT* If patient is at the pharmacy, call can be transferred to refill team.   1. Which medications need to be refilled? (please list name of each medication and dose if known) ranolazine (RANEXA) 1000 MG SR   2. Which pharmacy/location (including street and city if local pharmacy) is medication to be sent to?  [facility] [place], [place] AT [address] [phone] (Phone) [phone] (Fax)     3. Do they need a 30 day or 90 day supply? 90 day

## 2018-10-04 DIAGNOSIS — C642 Malignant neoplasm of left kidney, except renal pelvis: Secondary | ICD-10-CM | POA: Diagnosis not present

## 2018-10-04 DIAGNOSIS — N390 Urinary tract infection, site not specified: Secondary | ICD-10-CM | POA: Diagnosis not present

## 2018-10-05 DIAGNOSIS — N390 Urinary tract infection, site not specified: Secondary | ICD-10-CM | POA: Diagnosis not present

## 2018-10-11 DIAGNOSIS — C642 Malignant neoplasm of left kidney, except renal pelvis: Secondary | ICD-10-CM | POA: Diagnosis not present

## 2018-10-19 DIAGNOSIS — R112 Nausea with vomiting, unspecified: Secondary | ICD-10-CM | POA: Diagnosis not present

## 2018-10-19 DIAGNOSIS — U071 COVID-19: Secondary | ICD-10-CM | POA: Diagnosis not present

## 2018-10-19 DIAGNOSIS — R6889 Other general symptoms and signs: Secondary | ICD-10-CM | POA: Diagnosis not present

## 2018-10-19 DIAGNOSIS — R42 Dizziness and giddiness: Secondary | ICD-10-CM | POA: Diagnosis not present

## 2018-10-22 DIAGNOSIS — H8113 Benign paroxysmal vertigo, bilateral: Secondary | ICD-10-CM | POA: Diagnosis not present

## 2018-10-22 DIAGNOSIS — I129 Hypertensive chronic kidney disease with stage 1 through stage 4 chronic kidney disease, or unspecified chronic kidney disease: Secondary | ICD-10-CM | POA: Diagnosis not present

## 2018-10-22 DIAGNOSIS — N183 Chronic kidney disease, stage 3 (moderate): Secondary | ICD-10-CM | POA: Diagnosis not present
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
## 2018-10-27 ENCOUNTER — Encounter: Payer: Self-pay | Admitting: Cardiology

## 2018-10-27 ENCOUNTER — Other Ambulatory Visit: Payer: Self-pay

## 2018-10-27 ENCOUNTER — Ambulatory Visit (INDEPENDENT_AMBULATORY_CARE_PROVIDER_SITE_OTHER): Payer: PPO | Admitting: Cardiology

## 2018-10-27 DIAGNOSIS — I10 Essential (primary) hypertension: Secondary | ICD-10-CM

## 2018-10-27 DIAGNOSIS — I25119 Atherosclerotic heart disease of native coronary artery with unspecified angina pectoris: Secondary | ICD-10-CM

## 2018-10-27 DIAGNOSIS — N183 Chronic kidney disease, stage 3 unspecified: Secondary | ICD-10-CM

## 2018-10-27 DIAGNOSIS — E782 Mixed hyperlipidemia: Secondary | ICD-10-CM | POA: Diagnosis not present

## 2018-10-27 MED ORDER — TELMISARTAN-HCTZ 40-12.5 MG PO TABS: 1.0000 | ORAL_TABLET | Freq: Every day | ORAL | 3 refills | Status: DC

## 2018-10-27 NOTE — Patient Instructions (Signed)
Medication Instructions:  Your physician has recommended you make the following change in your medication:   STOP lisinopril  START telmisartan-hydrochlorothiazide (micardis-HCTZ) 40-12.5 mg: Take 1 tablet daily   If you need a refill on your cardiac medications before your next appointment, please call your pharmacy.   Lab work: Your physician recommends that you return for lab work in 2 weeks: BMP. Please go to our [place] office for labs, no appointment needed. No need to fast beforehand.   If you have labs (blood work) drawn today and your tests are completely normal, you will receive your results only by: Marland Kitchen MyChart Message (if you have MyChart) OR . A paper copy in the mail If you have any lab test that is abnormal or we need to change your treatment, we will call you to review the results.  Testing/Procedures: You had an EKG today.   Follow-Up: At [facility], you and your health needs are our priority.  As part of our continuing mission to provide you with exceptional heart care, we have created designated Provider Care Teams.  These Care Teams include your primary Cardiologist (physician) and Advanced Practice Providers (APPs -  Physician Assistants and Nurse Practitioners) who all work together to provide you with the care you need, when you need it. You will need a follow up appointment in 6 months.  Please call our office 2 months in advance to schedule this appointment.       Hydrochlorothiazide, HCTZ; Telmisartan tablets What is this medicine? TELMISARTAN; HYDROCHLOROTHIAZIDE (tel mi SAR tan; hye droe klor oh THYE a zide) is a combination of a drug that relaxes blood vessels and a diuretic. It is used to treat high blood pressure. This medicine may be used for other purposes; ask your health care provider or pharmacist if you have questions. COMMON BRAND NAME(S): Micardis HCT What should I tell my health care provider before I take this medicine? They need to know if  you have any of these conditions:  decreased urine  diabetes  if you are on a special diet, like a low-salt diet  immune system problems, like lupus  kidney disease  liver disease  an unusual or allergic reaction to telmisartan, hydrochlorothiazide, sulfa drugs, other medicines, foods, dyes, or preservatives  pregnant or trying to get pregnant  breast-feeding How should I use this medicine? Take this medicine by mouth with a drink of water. Follow the directions on the prescription label. You can take it with or without food. If it upsets your stomach, take it with food. Take your medicine at regular intervals. Do not take it more often than directed. Do not stop taking except on your doctor's advice. Talk to your pediatrician regarding the use of this medicine in children. Special care may be needed. Overdosage: If you think you have taken too much of this medicine contact a poison control center or emergency room at once. NOTE: This medicine is only for you. Do not share this medicine with others. What if I miss a dose? If you miss a dose, take it as soon as you can. If it is almost time for your next dose, take only that dose. Do not take double or extra doses. What may interact with this medicine?  barbiturates, like phenobarbital  blood pressure medicines  corticosteroids  diabetic medicines  digoxin  diuretics, especially triamterene, spironolactone or amiloride  lithium  NSAIDs, medicines for pain and inflammation, like ibuprofen or naproxen  potassium salts or potassium supplements  prescription  pain medicines  skeletal muscle relaxants like tubocurarine  some cholesterol lowering medicines like cholestyramine or colestipol  warfarin This list may not describe all possible interactions. Give your health care provider a list of all the medicines, herbs, non-prescription drugs, or dietary supplements you use. Also tell them if you smoke, drink alcohol, or  use illegal drugs. Some items may interact with your medicine. What should I watch for while using this medicine? Check your blood pressure regularly while you are taking this medicine. Ask your doctor or health care professional what your blood pressure should be and when you should contact him or her. When you check your blood pressure, write down the measurements to show your doctor or health care professional. If you are taking this medicine for a long time, you must visit your health care professional for regular checks on your progress. Make sure you schedule appointments on a regular basis. You must not get dehydrated. Ask your doctor or health care professional how much fluid you need to drink a day. Check with him or her if you get an attack of severe diarrhea, nausea and vomiting, or if you sweat a lot. The loss of too much body fluid can make it dangerous for you to take this medicine. Women should inform their doctor if they wish to become pregnant or think they might be pregnant. There is a potential for serious side effects to an unborn child, particularly in the second or third trimester. Talk to your health care professional or pharmacist for more information. You may get drowsy or dizzy. Do not drive, use machinery, or do anything that needs mental alertness until you know how this drug affects you. Do not stand or sit up quickly, especially if you are an older patient. This reduces the risk of dizzy or fainting spells. Alcohol can make you more drowsy and dizzy. Avoid alcoholic drinks. This medicine may increase blood sugar. Ask your healthcare provider if changes in diet or medicines are needed if you have diabetes. Avoid salt substitutes unless you are told otherwise by your doctor or health care professional. Do not treat yourself for coughs, colds, or pain while you are taking this medicine without asking your doctor or health care professional for advice. Some ingredients may increase  your blood pressure. This medicine can make you more sensitive to the sun. Keep out of the sun. If you cannot avoid being in the sun, wear protective clothing and use sunscreen. Do not use sun lamps or tanning beds/booths. What side effects may I notice from receiving this medicine? Side effects that you should report to your doctor or health care professional as soon as possible:  allergic reactions like skin rash, itching or hives, swelling of the face, lips, or tongue  breathing problems  changes in vision  dark urine  eye pain  fast or irregular heart beat, palpitations, or chest pain  feeling faint or lightheaded  muscle cramps  persistent dry cough  redness, blistering, peeling or loosening of the skin, including inside the mouth   signs and symptoms of high blood sugar such as being more thirsty or hungry or having to urinate more than normal. You may also feel very tired or have blurry vision.  stomach pain  trouble passing urine  unusual bleeding or bruising  worsened gout pain  yellowing of the eyes or skin Side effects that usually do not require medical attention (report to your doctor or health care professional if they continue or are  bothersome):  change in sex drive or performance  headache This list may not describe all possible side effects. Call your doctor for medical advice about side effects. You may report side effects to FDA at 1-800-FDA-1088. Where should I keep my medicine? Keep out of the reach of children. Store at room temperature between 15 and 30 degrees C (59 and 86 degrees F). Tablets should not be removed from the blisters until right before use. Throw away any unused medicine after the expiration date. NOTE: This sheet is a summary. It may not cover all possible information. If you have questions about this medicine, talk to your doctor, pharmacist, or health care provider.  2020 Elsevier/Gold Standard (2017-12-23 10:15:51)

## 2018-11-08 DIAGNOSIS — Z1231 Encounter for screening mammogram for malignant neoplasm of breast: Secondary | ICD-10-CM | POA: Diagnosis not present

## 2018-11-11 DIAGNOSIS — N183 Chronic kidney disease, stage 3 (moderate): Secondary | ICD-10-CM | POA: Diagnosis not present

## 2018-11-11 DIAGNOSIS — I10 Essential (primary) hypertension: Secondary | ICD-10-CM | POA: Diagnosis not present

## 2018-11-11 DIAGNOSIS — R42 Dizziness and giddiness: Secondary | ICD-10-CM | POA: Diagnosis not present

## 2018-11-11 DIAGNOSIS — E782 Mixed hyperlipidemia: Secondary | ICD-10-CM | POA: Diagnosis not present

## 2018-11-11 DIAGNOSIS — I25119 Atherosclerotic heart disease of native coronary artery with unspecified angina pectoris: Secondary | ICD-10-CM | POA: Diagnosis not present

## 2018-11-12 ENCOUNTER — Encounter: Payer: Self-pay | Admitting: *Deleted

## 2018-11-12 LAB — BASIC METABOLIC PANEL
BUN/Creatinine Ratio: 15 (ref 12–28)
BUN: 26 mg/dL (ref 8–27)
CO2: 24 mmol/L (ref 20–29)
Calcium: 9.6 mg/dL (ref 8.7–10.3)
Chloride: 102 mmol/L (ref 96–106)
Creatinine, Ser: 1.7 mg/dL — ABNORMAL HIGH (ref 0.57–1.00)
GFR calc Af Amer: 34 mL/min/{1.73_m2} — ABNORMAL LOW (ref >59)
GFR calc non Af Amer: 30 mL/min/{1.73_m2} — ABNORMAL LOW (ref >59)
Glucose: 176 mg/dL — ABNORMAL HIGH (ref 65–99)
Potassium: 4 mmol/L (ref 3.5–5.2)
Sodium: 140 mmol/L (ref 134–144)

## 2018-11-16 ENCOUNTER — Ambulatory Visit: Payer: PPO | Admitting: Cardiology

## 2018-11-29 DIAGNOSIS — Z9861 Coronary angioplasty status: Secondary | ICD-10-CM | POA: Diagnosis not present

## 2018-11-29 DIAGNOSIS — E785 Hyperlipidemia, unspecified: Secondary | ICD-10-CM | POA: Diagnosis not present

## 2018-11-29 DIAGNOSIS — D509 Iron deficiency anemia, unspecified: Secondary | ICD-10-CM | POA: Diagnosis not present

## 2018-11-29 DIAGNOSIS — I2510 Atherosclerotic heart disease of native coronary artery without angina pectoris: Secondary | ICD-10-CM | POA: Diagnosis not present

## 2018-11-29 DIAGNOSIS — N183 Chronic kidney disease, stage 3 (moderate): Secondary | ICD-10-CM | POA: Diagnosis not present

## 2018-11-29 DIAGNOSIS — E1165 Type 2 diabetes mellitus with hyperglycemia: Secondary | ICD-10-CM | POA: Diagnosis not present

## 2018-11-29 DIAGNOSIS — E1129 Type 2 diabetes mellitus with other diabetic kidney complication: Secondary | ICD-10-CM | POA: Diagnosis not present

## 2018-11-29 DIAGNOSIS — R809 Proteinuria, unspecified: Secondary | ICD-10-CM | POA: Diagnosis not present

## 2018-11-29 DIAGNOSIS — I129 Hypertensive chronic kidney disease with stage 1 through stage 4 chronic kidney disease, or unspecified chronic kidney disease: Secondary | ICD-10-CM | POA: Diagnosis not present

## 2018-11-29 DIAGNOSIS — F329 Major depressive disorder, single episode, unspecified: Secondary | ICD-10-CM | POA: Diagnosis not present

## 2018-12-28 ENCOUNTER — Telehealth: Payer: Self-pay | Admitting: Cardiology

## 2018-12-28 MED ORDER — PRASUGREL HCL 10 MG PO TABS: ORAL_TABLET | ORAL | 1 refills | Status: DC

## 2018-12-28 NOTE — Addendum Note (Signed)
Addended by: Stevan Born on: 12/28/2018 04:04 PM   Modules accepted: Orders

## 2018-12-28 NOTE — Telephone Encounter (Signed)
Rx for effient sent to [id number] as requested.

## 2018-12-28 NOTE — Telephone Encounter (Signed)
Call [facility] to [facility] on [address]

## 2019-01-28 DIAGNOSIS — H8113 Benign paroxysmal vertigo, bilateral: Secondary | ICD-10-CM | POA: Diagnosis not present

## 2019-02-02 DIAGNOSIS — H8110 Benign paroxysmal vertigo, unspecified ear: Secondary | ICD-10-CM | POA: Diagnosis not present

## 2019-02-02 DIAGNOSIS — R269 Unspecified abnormalities of gait and mobility: Secondary | ICD-10-CM | POA: Diagnosis not present

## 2019-02-02 DIAGNOSIS — R42 Dizziness and giddiness: Secondary | ICD-10-CM | POA: Diagnosis not present

## 2019-02-02 DIAGNOSIS — R2681 Unsteadiness on feet: Secondary | ICD-10-CM | POA: Diagnosis not present

## 2019-02-08 DIAGNOSIS — R42 Dizziness and giddiness: Secondary | ICD-10-CM | POA: Diagnosis not present

## 2019-02-08 DIAGNOSIS — R2681 Unsteadiness on feet: Secondary | ICD-10-CM | POA: Diagnosis not present

## 2019-02-08 DIAGNOSIS — H8110 Benign paroxysmal vertigo, unspecified ear: Secondary | ICD-10-CM | POA: Diagnosis not present

## 2019-02-08 DIAGNOSIS — R269 Unspecified abnormalities of gait and mobility: Secondary | ICD-10-CM | POA: Diagnosis not present

## 2019-02-15 DIAGNOSIS — R2681 Unsteadiness on feet: Secondary | ICD-10-CM | POA: Diagnosis not present

## 2019-02-15 DIAGNOSIS — R42 Dizziness and giddiness: Secondary | ICD-10-CM | POA: Diagnosis not present

## 2019-02-15 DIAGNOSIS — H8110 Benign paroxysmal vertigo, unspecified ear: Secondary | ICD-10-CM | POA: Diagnosis not present

## 2019-02-15 DIAGNOSIS — R269 Unspecified abnormalities of gait and mobility: Secondary | ICD-10-CM | POA: Diagnosis not present

## 2019-02-24 DIAGNOSIS — S0083XA Contusion of other part of head, initial encounter: Secondary | ICD-10-CM | POA: Diagnosis not present

## 2019-02-24 DIAGNOSIS — S199XXA Unspecified injury of neck, initial encounter: Secondary | ICD-10-CM | POA: Diagnosis not present

## 2019-02-24 DIAGNOSIS — S0081XA Abrasion of other part of head, initial encounter: Secondary | ICD-10-CM | POA: Diagnosis not present

## 2019-02-24 DIAGNOSIS — S0990XA Unspecified injury of head, initial encounter: Secondary | ICD-10-CM | POA: Diagnosis not present

## 2019-02-24 DIAGNOSIS — R519 Headache, unspecified: Secondary | ICD-10-CM | POA: Diagnosis not present

## 2019-02-28 DIAGNOSIS — E785 Hyperlipidemia, unspecified: Secondary | ICD-10-CM | POA: Diagnosis not present

## 2019-02-28 DIAGNOSIS — E1129 Type 2 diabetes mellitus with other diabetic kidney complication: Secondary | ICD-10-CM | POA: Diagnosis not present

## 2019-02-28 DIAGNOSIS — I129 Hypertensive chronic kidney disease with stage 1 through stage 4 chronic kidney disease, or unspecified chronic kidney disease: Secondary | ICD-10-CM | POA: Diagnosis not present

## 2019-02-28 DIAGNOSIS — F329 Major depressive disorder, single episode, unspecified: Secondary | ICD-10-CM | POA: Diagnosis not present

## 2019-02-28 DIAGNOSIS — Z9861 Coronary angioplasty status: Secondary | ICD-10-CM | POA: Diagnosis not present

## 2019-02-28 DIAGNOSIS — R809 Proteinuria, unspecified: Secondary | ICD-10-CM | POA: Diagnosis not present

## 2019-02-28 DIAGNOSIS — I2510 Atherosclerotic heart disease of native coronary artery without angina pectoris: Secondary | ICD-10-CM | POA: Diagnosis not present

## 2019-02-28 DIAGNOSIS — E1165 Type 2 diabetes mellitus with hyperglycemia: Secondary | ICD-10-CM | POA: Diagnosis not present

## 2019-02-28 DIAGNOSIS — N183 Chronic kidney disease, stage 3 unspecified: Secondary | ICD-10-CM | POA: Diagnosis not present

## 2019-02-28 DIAGNOSIS — R399 Unspecified symptoms and signs involving the genitourinary system: Secondary | ICD-10-CM | POA: Diagnosis not present

## 2019-02-28 DIAGNOSIS — D509 Iron deficiency anemia, unspecified: Secondary | ICD-10-CM | POA: Diagnosis not present

## 2019-05-05 ENCOUNTER — Telehealth (INDEPENDENT_AMBULATORY_CARE_PROVIDER_SITE_OTHER): Payer: PPO | Admitting: Cardiology

## 2019-05-05 ENCOUNTER — Other Ambulatory Visit: Payer: Self-pay

## 2019-05-05 ENCOUNTER — Encounter: Payer: Self-pay | Admitting: Cardiology

## 2019-05-05 VITALS — BP 127/71 | HR 73 | Ht 61.0 in | Wt 170.0 lb

## 2019-05-05 DIAGNOSIS — E782 Mixed hyperlipidemia: Secondary | ICD-10-CM

## 2019-05-05 DIAGNOSIS — N183 Chronic kidney disease, stage 3 unspecified: Secondary | ICD-10-CM

## 2019-05-05 DIAGNOSIS — I10 Essential (primary) hypertension: Secondary | ICD-10-CM

## 2019-05-05 DIAGNOSIS — I25119 Atherosclerotic heart disease of native coronary artery with unspecified angina pectoris: Secondary | ICD-10-CM

## 2019-05-05 MED ORDER — ATORVASTATIN CALCIUM 80 MG PO TABS: 80.0000 mg | ORAL_TABLET | Freq: Every day | ORAL | 2 refills | Status: DC
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
## 2019-05-05 NOTE — Patient Instructions (Signed)
Medication Instructions:  *Your physician recommends that you continue on your current medications as directed. Please refer to the Current Medication list given to you today.  *If you need a refill on your cardiac medications before your next appointment, please call your pharmacy*  Lab Work: [place]   If you have labs (blood work) drawn today and your tests are completely normal, you will receive your results only by: Marland Kitchen MyChart Message (if you have MyChart) OR . A paper copy in the mail If you have any lab test that is abnormal or we need to change your treatment, we will call you to review the results.  Testing/Procedures: NONE ORDERED  TODAY  Follow-Up: At [facility], you and your health needs are our priority.  As part of our continuing mission to provide you with exceptional heart care, we have created designated Provider Care Teams.  These Care Teams include your primary Cardiologist (physician) and Advanced Practice Providers (APPs -  Physician Assistants and Nurse Practitioners) who all work together to provide you with the care you need, when you need it.  Your next appointment:   6 month(s)  The format for your next appointment:   In Person  Provider:   You will see Dr Bettina Gavia.  Or, you can be scheduled with the following Advanced Practice Provider on your designated Care Team (at our [facility]):  Laurann Montana, FNP    Other Instructions

## 2019-06-03 IMAGING — CR DG CHEST 2V
2 series · 2 of 2 positions shown · non-contrast
Comparison: 12/31/2016

CLINICAL DATA: Preop exam

EXAM:
CHEST - 2 VIEW

[w chest pa]
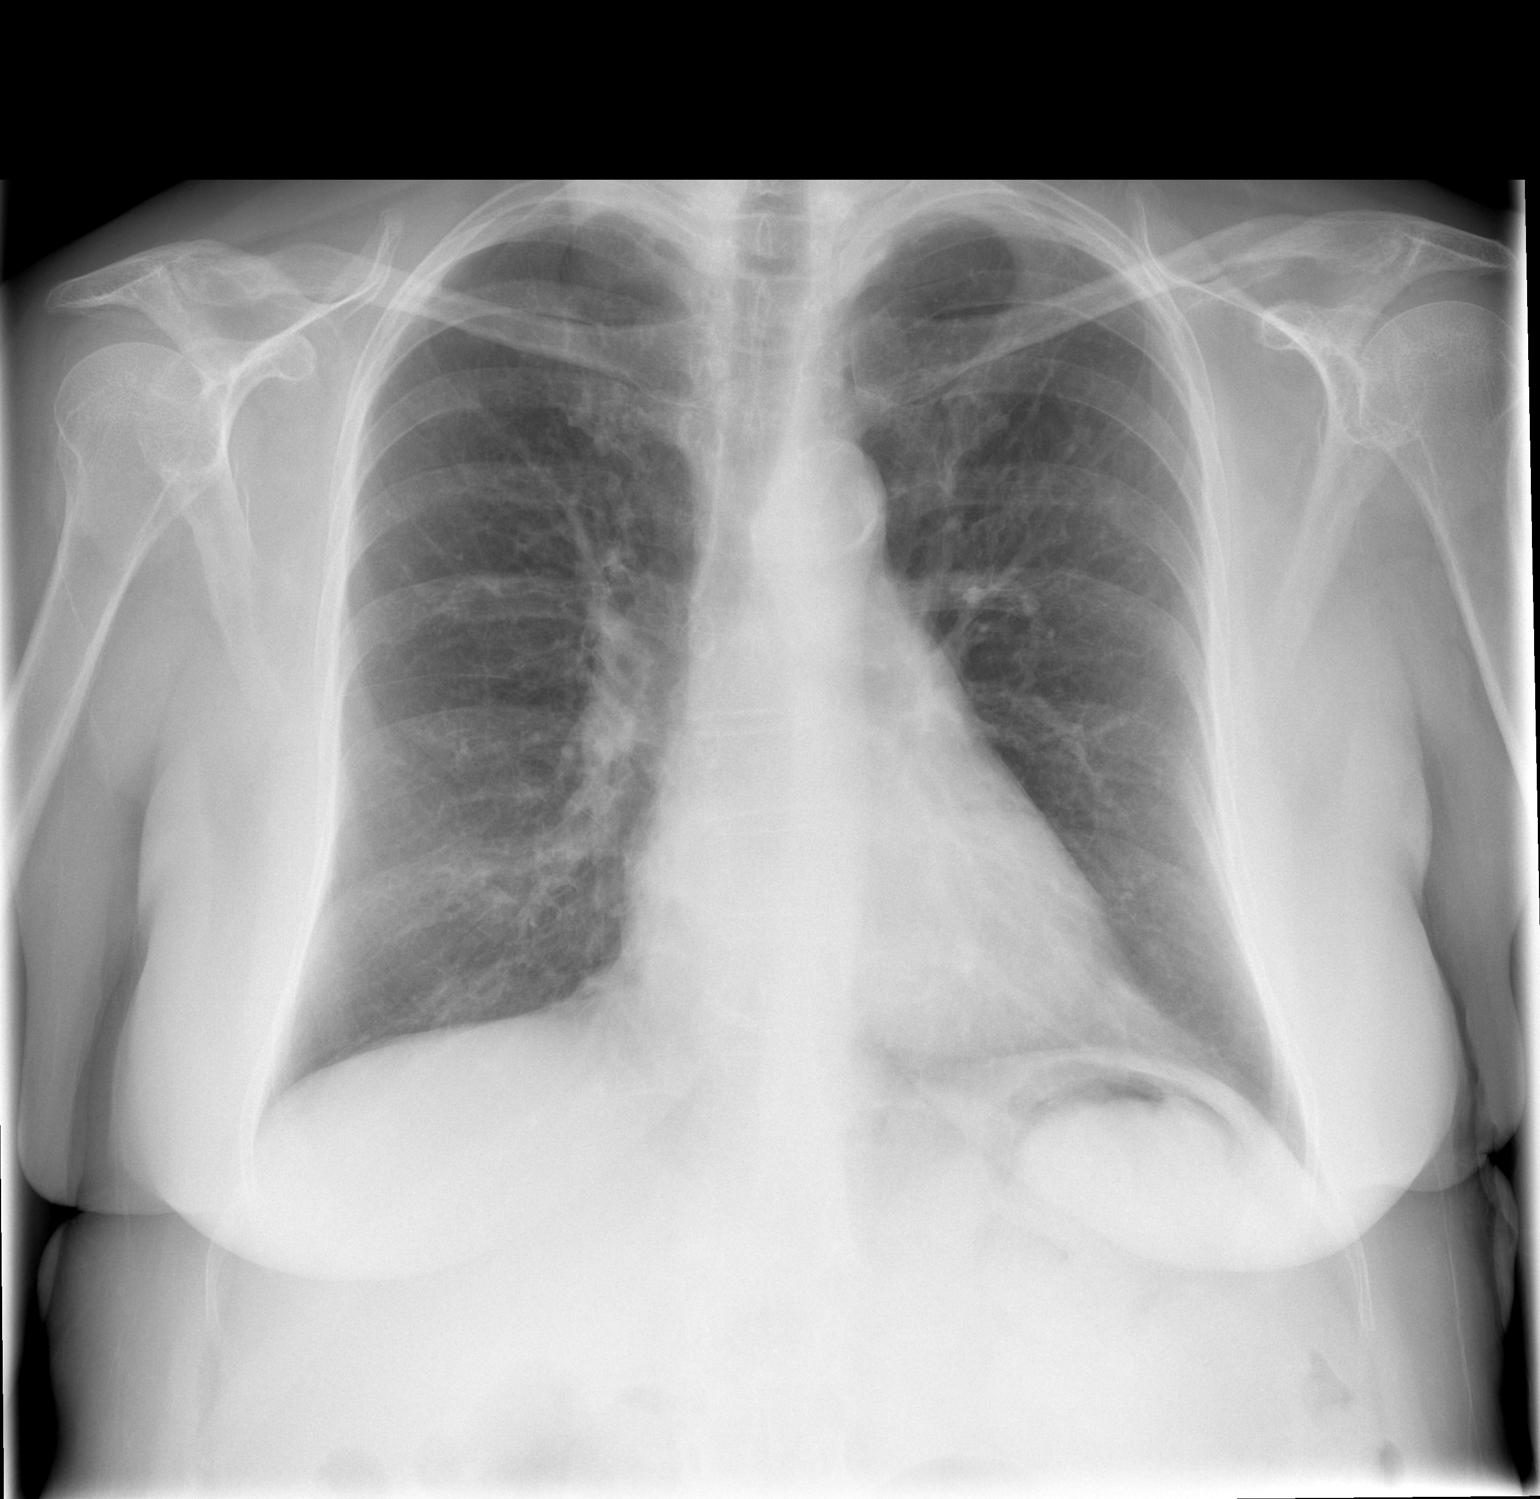

[w chest lat]
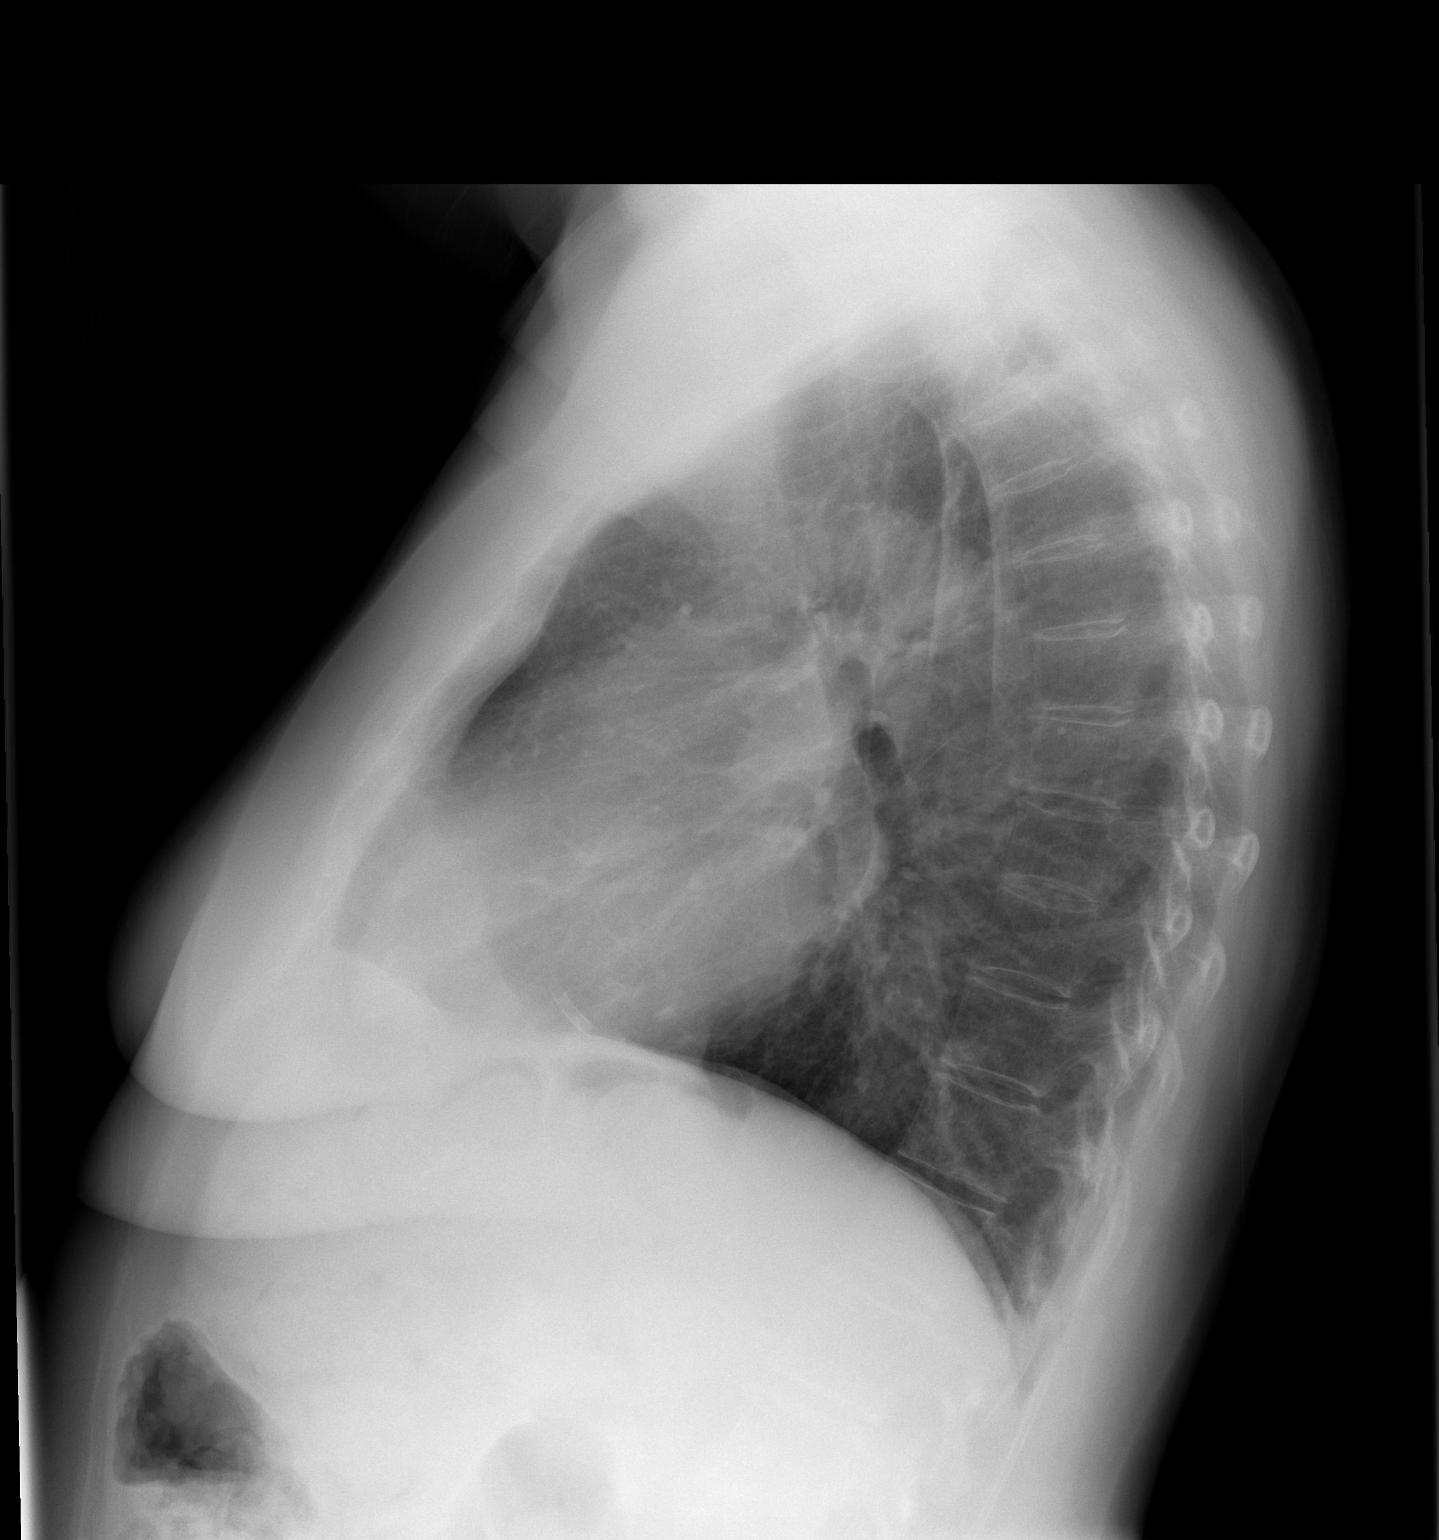

[2 of 2 positions shown; findings below may reference images not displayed]

FINDINGS: Heart and mediastinal contours are within normal limits. No focal
opacities or effusions. No acute bony abnormality.
IMPRESSION: No active cardiopulmonary disease.

## 2019-06-04 DIAGNOSIS — S8002XA Contusion of left knee, initial encounter: Secondary | ICD-10-CM | POA: Diagnosis not present

## 2019-06-04 DIAGNOSIS — S40022A Contusion of left upper arm, initial encounter: Secondary | ICD-10-CM | POA: Diagnosis not present

## 2019-06-06 DIAGNOSIS — D509 Iron deficiency anemia, unspecified: Secondary | ICD-10-CM | POA: Diagnosis not present

## 2019-06-06 DIAGNOSIS — R809 Proteinuria, unspecified: Secondary | ICD-10-CM | POA: Diagnosis not present

## 2019-06-06 DIAGNOSIS — Z9861 Coronary angioplasty status: Secondary | ICD-10-CM | POA: Diagnosis not present

## 2019-06-06 DIAGNOSIS — N183 Chronic kidney disease, stage 3 unspecified: Secondary | ICD-10-CM | POA: Diagnosis not present

## 2019-06-06 DIAGNOSIS — E1165 Type 2 diabetes mellitus with hyperglycemia: Secondary | ICD-10-CM | POA: Diagnosis not present

## 2019-06-06 DIAGNOSIS — I2510 Atherosclerotic heart disease of native coronary artery without angina pectoris: Secondary | ICD-10-CM | POA: Diagnosis not present

## 2019-06-06 DIAGNOSIS — E785 Hyperlipidemia, unspecified: Secondary | ICD-10-CM | POA: Diagnosis not present

## 2019-06-06 DIAGNOSIS — I129 Hypertensive chronic kidney disease with stage 1 through stage 4 chronic kidney disease, or unspecified chronic kidney disease: Secondary | ICD-10-CM | POA: Diagnosis not present

## 2019-06-06 DIAGNOSIS — F329 Major depressive disorder, single episode, unspecified: Secondary | ICD-10-CM | POA: Diagnosis not present

## 2019-06-06 DIAGNOSIS — E1129 Type 2 diabetes mellitus with other diabetic kidney complication: Secondary | ICD-10-CM | POA: Diagnosis not present

## 2019-06-06 DIAGNOSIS — Z139 Encounter for screening, unspecified: Secondary | ICD-10-CM | POA: Diagnosis not present

## 2019-06-08 DIAGNOSIS — S52532A Colles' fracture of left radius, initial encounter for closed fracture: Secondary | ICD-10-CM | POA: Diagnosis not present

## 2019-06-15 DIAGNOSIS — S52532A Colles' fracture of left radius, initial encounter for closed fracture: Secondary | ICD-10-CM | POA: Diagnosis not present

## 2019-06-23 DIAGNOSIS — M7989 Other specified soft tissue disorders: Secondary | ICD-10-CM | POA: Diagnosis not present

## 2019-06-23 DIAGNOSIS — M79672 Pain in left foot: Secondary | ICD-10-CM | POA: Diagnosis not present

## 2019-06-23 DIAGNOSIS — M79662 Pain in left lower leg: Secondary | ICD-10-CM | POA: Diagnosis not present

## 2019-06-23 DIAGNOSIS — R42 Dizziness and giddiness: Secondary | ICD-10-CM | POA: Diagnosis not present

## 2019-06-27 ENCOUNTER — Other Ambulatory Visit: Payer: Self-pay | Admitting: Cardiology

## 2019-07-13 DIAGNOSIS — S52532D Colles' fracture of left radius, subsequent encounter for closed fracture with routine healing: Secondary | ICD-10-CM | POA: Diagnosis not present

## 2019-07-15 ENCOUNTER — Encounter: Payer: Self-pay | Admitting: Sports Medicine

## 2019-07-15 ENCOUNTER — Other Ambulatory Visit: Payer: Self-pay

## 2019-07-15 ENCOUNTER — Ambulatory Visit (INDEPENDENT_AMBULATORY_CARE_PROVIDER_SITE_OTHER): Payer: PPO | Admitting: Sports Medicine

## 2019-07-15 ENCOUNTER — Other Ambulatory Visit: Payer: Self-pay | Admitting: Sports Medicine

## 2019-07-15 DIAGNOSIS — M19072 Primary osteoarthritis, left ankle and foot: Secondary | ICD-10-CM | POA: Diagnosis not present

## 2019-07-15 DIAGNOSIS — E119 Type 2 diabetes mellitus without complications: Secondary | ICD-10-CM

## 2019-07-15 DIAGNOSIS — M79672 Pain in left foot: Secondary | ICD-10-CM

## 2019-07-15 DIAGNOSIS — M779 Enthesopathy, unspecified: Secondary | ICD-10-CM

## 2019-07-15 DIAGNOSIS — S93602A Unspecified sprain of left foot, initial encounter: Secondary | ICD-10-CM | POA: Diagnosis not present

## 2019-07-15 NOTE — Progress Notes (Signed)
Subjective: Julia Logan is a 74 y.o. female patient who presents to office for evaluation of left foot pain.  Patient reports that there is pain over the top and lateral side of her left foot that started after injury back in March when she missed a few steps and felt on the bleachers.  Patient reports that pain has slowly gotten a little bit better 5 out of 10 depending on certain movements with her foot or if she steps or moves her foot a wrong way but does admit to significant swelling was seen at urgent care then went to PCP where she had x-rays and was told she needed to see a podiatrist.  Patient is also diabetic with last blood sugar on Tuesday of 107.  Last A1c 6.1.  And last visit to PCP was 1 month ago.  Review of Systems  All other systems reviewed and are negative.    Patient Active Problem List   Diagnosis Date Noted  . CKD (chronic kidney disease) stage 3, GFR 30-59 ml/min 10/27/2017  . Malignant neoplasm of left kidney ([place]) 10/09/2017  . Abnormal nuclear stress test 09/09/2017  . Stable angina pectoris ([place]) 05/11/2017  . Acid reflux disease 05/05/2017  . Anemia 05/05/2017  . Chronic pain 05/05/2017  . Hard of hearing 05/05/2017  . History of recurrent UTI (urinary tract infection) 05/05/2017  . Hyperlipidemia 05/05/2017  . Sciatica, right side 05/05/2017  . Coronary artery disease involving native coronary artery of native heart with angina pectoris ([place]) 01/01/2017  . Diabetes mellitus ([place]) 12/31/2016  . Hypertension 12/31/2016  . Atrophic vaginitis 08/14/2016  . History of nephrectomy 03/19/2016  . Cancer of kidney ([place]) 10/04/2015  . Urinary tract infection 09/24/2015    Current Outpatient Medications on File Prior to Visit  Medication Sig Dispense Refill  . ACCU-CHEK AVIVA PLUS test strip U TO CHECK BS ONCE D    . alendronate (FOSAMAX) 70 MG tablet Take 70 mg by mouth every Sunday. Take with a full glass of water on an empty stomach.     Marland Kitchen aspirin 81 MG  tablet Take 81 mg by mouth daily.    Marland Kitchen atorvastatin (LIPITOR) 80 MG tablet Take 1 tablet (80 mg total) by mouth daily. 90 tablet 2  . Calcium Carbonate-Vit D-Min (CALCIUM 1200) 1200-1000 MG-UNIT CHEW Chew 1 tablet by mouth 2 (two) times daily.     . Calcium Carbonate-Vitamin D 600-200 MG-UNIT TABS Take by mouth.    . estradiol (ESTRACE) 0.1 MG/GM vaginal cream Place 1 Applicatorful vaginally 3 (three) times a week.    . ezetimibe (ZETIA) 10 MG tablet Take 10 mg by mouth daily.   3  . Ferrous Sulfate (IRON) 325 (65 Fe) MG TABS Take 325-650 mg by mouth See admin instructions. Take 325 mg by mouth in the morning and take 650 mg by mouth at bedtime    . glimepiride (AMARYL) 4 MG tablet Take 4 mg by mouth daily with breakfast.   3  . metoprolol tartrate (LOPRESSOR) 25 MG tablet Take 0.5 tablets (12.5 mg total) by mouth 2 (two) times daily. 90 tablet 3  . nitroGLYCERIN (NITROSTAT) 0.4 MG SL tablet Place 1 tablet (0.4 mg total) under the tongue every 5 (five) minutes as needed for chest pain. 25 tablet 12  . pantoprazole (PROTONIX) 40 MG tablet Take 40 mg by mouth daily.    . prasugrel (EFFIENT) 10 MG TABS tablet TAKE 1 TABLET BY MOUTH DAILY 90 tablet 1  . Probiotic Product (  PROBIOTIC PO) Take 1 capsule by mouth daily.    . ranolazine (RANEXA) 1000 MG SR tablet Take 1 tablet (1,000 mg total) by mouth 2 (two) times daily. 180 tablet 3  . telmisartan-hydrochlorothiazide (MICARDIS HCT) 40-12.5 MG tablet Take 1 tablet by mouth daily. 30 tablet 3   No current facility-administered medications on file prior to visit.    Allergies  Allergen Reactions  . Gabapentin Swelling and Other (See Comments)    Feet and legs  . Ticagrelor Shortness Of Breath  . Codeine Other (See Comments)    hallucinations    Objective:  General: Alert and oriented x3 in no acute distress  Dermatology: No open lesions bilateral lower extremities, no webspace macerations, no ecchymosis bilateral, all nails x 10 are short and  thick but well manicured.  Vascular: Dorsalis Pedis and Posterior Tibial pedal pulses palpable, Capillary Fill Time 3 seconds,(+) scant pedal hair growth bilateral, trace edema bilateral lower extremities, varicosities bilateral, Temperature gradient within normal limits.  Neurology: Johney Maine sensation intact via light touch bilateral.  Musculoskeletal: Mild tenderness with palpation at dorsal lateral midfoot on the left at the fourth fifth metatarsal bases with a palpable bone spur, no reproducible pain along the peroneal tendon course on the left.  No pain with calf compression bilateral.  Strength within normal limits in all groups bilateral.    Assessment and Plan: Problem List Items Addressed This Visit    None    Visit Diagnoses    Tendinitis    -  Primary   Sprain of left foot, initial encounter       Arthritis of left foot       Left foot pain       Diabetes mellitus without complication (HCC)           -Complete examination performed -Xrays reviewed from PCP revealing arthritis at the second and third tarsometatarsal joints, history of bunion with pin fixation -Discussed treatement options for likely foot sprain versus tendinitis with underlying arthritis -Patient declined steroid injection at this visit -Recommend rest ice elevation and topical Voltaren over-the-counter to use as needed for foot pain on the left -Dispensed ankle gauntlet for patient to use as instructed for the next month after 1 month may slowly wean from using the brace if feels better -Advised good supportive shoes -Patient to return to office 1 month if symptoms have not resolved or sooner if condition worsens.  Landis Martins, DPM

## 2019-07-15 NOTE — Patient Instructions (Signed)
Get OTC Voltaren cream for foot pain

## 2019-08-12 ENCOUNTER — Ambulatory Visit: Payer: PPO | Admitting: Sports Medicine

## 2019-08-12 DIAGNOSIS — S52532D Colles' fracture of left radius, subsequent encounter for closed fracture with routine healing: Secondary | ICD-10-CM | POA: Diagnosis not present

## 2019-09-06 ENCOUNTER — Other Ambulatory Visit: Payer: Self-pay

## 2019-09-06 ENCOUNTER — Telehealth: Payer: Self-pay | Admitting: Cardiology

## 2019-09-06 DIAGNOSIS — E1129 Type 2 diabetes mellitus with other diabetic kidney complication: Secondary | ICD-10-CM | POA: Diagnosis not present

## 2019-09-06 DIAGNOSIS — R0602 Shortness of breath: Secondary | ICD-10-CM | POA: Diagnosis not present

## 2019-09-06 DIAGNOSIS — Z8679 Personal history of other diseases of the circulatory system: Secondary | ICD-10-CM | POA: Diagnosis not present

## 2019-09-06 DIAGNOSIS — E1165 Type 2 diabetes mellitus with hyperglycemia: Secondary | ICD-10-CM | POA: Diagnosis not present

## 2019-09-06 DIAGNOSIS — R42 Dizziness and giddiness: Secondary | ICD-10-CM | POA: Diagnosis not present

## 2019-09-06 DIAGNOSIS — R079 Chest pain, unspecified: Secondary | ICD-10-CM | POA: Diagnosis not present

## 2019-09-06 DIAGNOSIS — R0789 Other chest pain: Secondary | ICD-10-CM | POA: Diagnosis not present

## 2019-09-06 DIAGNOSIS — I2510 Atherosclerotic heart disease of native coronary artery without angina pectoris: Secondary | ICD-10-CM | POA: Diagnosis not present

## 2019-09-06 DIAGNOSIS — E1122 Type 2 diabetes mellitus with diabetic chronic kidney disease: Secondary | ICD-10-CM | POA: Diagnosis not present

## 2019-09-06 DIAGNOSIS — E785 Hyperlipidemia, unspecified: Secondary | ICD-10-CM | POA: Diagnosis not present

## 2019-09-06 DIAGNOSIS — I498 Other specified cardiac arrhythmias: Secondary | ICD-10-CM | POA: Diagnosis not present

## 2019-09-06 DIAGNOSIS — R399 Unspecified symptoms and signs involving the genitourinary system: Secondary | ICD-10-CM | POA: Diagnosis not present

## 2019-09-06 DIAGNOSIS — Z9181 History of falling: Secondary | ICD-10-CM | POA: Diagnosis not present

## 2019-09-06 DIAGNOSIS — I10 Essential (primary) hypertension: Secondary | ICD-10-CM | POA: Diagnosis not present

## 2019-09-06 DIAGNOSIS — R0600 Dyspnea, unspecified: Secondary | ICD-10-CM | POA: Diagnosis not present

## 2019-09-06 DIAGNOSIS — Z7984 Long term (current) use of oral hypoglycemic drugs: Secondary | ICD-10-CM | POA: Diagnosis not present

## 2019-09-06 DIAGNOSIS — N390 Urinary tract infection, site not specified: Secondary | ICD-10-CM | POA: Diagnosis not present

## 2019-09-06 DIAGNOSIS — I129 Hypertensive chronic kidney disease with stage 1 through stage 4 chronic kidney disease, or unspecified chronic kidney disease: Secondary | ICD-10-CM | POA: Diagnosis not present

## 2019-09-06 DIAGNOSIS — D509 Iron deficiency anemia, unspecified: Secondary | ICD-10-CM | POA: Diagnosis not present

## 2019-09-06 DIAGNOSIS — Z79899 Other long term (current) drug therapy: Secondary | ICD-10-CM | POA: Diagnosis not present

## 2019-09-06 DIAGNOSIS — N183 Chronic kidney disease, stage 3 unspecified: Secondary | ICD-10-CM | POA: Diagnosis not present

## 2019-09-06 DIAGNOSIS — K219 Gastro-esophageal reflux disease without esophagitis: Secondary | ICD-10-CM | POA: Diagnosis not present

## 2019-09-06 DIAGNOSIS — R809 Proteinuria, unspecified: Secondary | ICD-10-CM | POA: Diagnosis not present

## 2019-09-06 DIAGNOSIS — Z9861 Coronary angioplasty status: Secondary | ICD-10-CM | POA: Diagnosis not present

## 2019-09-06 DIAGNOSIS — J90 Pleural effusion, not elsewhere classified: Secondary | ICD-10-CM | POA: Diagnosis not present

## 2019-09-06 DIAGNOSIS — F329 Major depressive disorder, single episode, unspecified: Secondary | ICD-10-CM | POA: Diagnosis not present

## 2019-09-06 DIAGNOSIS — Z955 Presence of coronary angioplasty implant and graft: Secondary | ICD-10-CM | POA: Diagnosis not present

## 2019-09-06 DIAGNOSIS — E119 Type 2 diabetes mellitus without complications: Secondary | ICD-10-CM | POA: Diagnosis not present

## 2019-09-06 MED ORDER — RANOLAZINE ER 1000 MG PO TB12: 1000.0000 mg | ORAL_TABLET | Freq: Two times a day (BID) | ORAL | 1 refills | Status: DC

## 2019-09-06 NOTE — Telephone Encounter (Signed)
She has no history of coronary artery disease, which shortness of breath at rest I think she should go to the ED I recommend [facility] and she needs to have a high-sensitivity troponin with her coronary artery disease and previous PCI and stent

## 2019-09-06 NOTE — Telephone Encounter (Signed)
Called patient. She reports that for the last week she is having shortness of breath on exertion only. She denies weight gain. She has some swelling to her left foot and ankle that she has been having off and on since march 2021 when she fell on it. No swelling of the right foot. She is currently taking micardis 40/12.5mg   (1/2 tablet) daily, her pcp decreased her from 1 tablet daily in march 2021 to 1/2 tablet daily. Patient was advised to check with Dr. Bettina Gavia about this will consult with him.

## 2019-09-06 NOTE — Telephone Encounter (Signed)
Left message for patient to return call.

## 2019-09-06 NOTE — Telephone Encounter (Signed)
Pt called back returning Hayley's Call

## 2019-09-06 NOTE — Telephone Encounter (Signed)
Rx refill sent into [facility] for Ranolazine 1000 mg.

## 2019-09-06 NOTE — Telephone Encounter (Signed)
° ° ° °  Pt c/o Shortness Of Breath: STAT if SOB developed within the last 24 hours or pt is noticeably SOB on the phone  1. Are you currently SOB (can you hear that pt is SOB on the phone)? Yes, not sob on the phone  2. How long have you been experiencing SOB? A week ago  3. Are you SOB when sitting or when up moving around? Up moving around  4. Are you currently experiencing any other symptoms? No other symptoms  Pt said she's been sob for a week and her pcp advised her to call Dr. Bettina Gavia

## 2019-09-06 NOTE — Telephone Encounter (Signed)
Spoke to the patient just now and let her know Dr. Joya Gaskins recommendations. She verbalizes understanding and does not have any other questions or concerns at this time.    Encouraged patient to call back with any questions or concerns.

## 2019-09-07 DIAGNOSIS — R0602 Shortness of breath: Secondary | ICD-10-CM | POA: Diagnosis not present

## 2019-09-07 DIAGNOSIS — R079 Chest pain, unspecified: Secondary | ICD-10-CM | POA: Diagnosis not present

## 2019-09-07 DIAGNOSIS — I2510 Atherosclerotic heart disease of native coronary artery without angina pectoris: Secondary | ICD-10-CM | POA: Diagnosis not present

## 2019-09-07 DIAGNOSIS — K219 Gastro-esophageal reflux disease without esophagitis: Secondary | ICD-10-CM | POA: Diagnosis not present

## 2019-09-07 DIAGNOSIS — I10 Essential (primary) hypertension: Secondary | ICD-10-CM | POA: Diagnosis not present

## 2019-09-08 DIAGNOSIS — Z8679 Personal history of other diseases of the circulatory system: Secondary | ICD-10-CM | POA: Diagnosis not present

## 2019-09-08 DIAGNOSIS — R0600 Dyspnea, unspecified: Secondary | ICD-10-CM | POA: Diagnosis not present

## 2019-09-08 DIAGNOSIS — R079 Chest pain, unspecified: Secondary | ICD-10-CM | POA: Diagnosis not present

## 2019-09-08 DIAGNOSIS — R0602 Shortness of breath: Secondary | ICD-10-CM | POA: Diagnosis not present

## 2019-09-13 DIAGNOSIS — Z6835 Body mass index (BMI) 35.0-35.9, adult: Secondary | ICD-10-CM | POA: Diagnosis not present

## 2019-09-13 DIAGNOSIS — Z1331 Encounter for screening for depression: Secondary | ICD-10-CM | POA: Diagnosis not present

## 2019-09-13 DIAGNOSIS — Z0000 Encounter for general adult medical examination without abnormal findings: Secondary | ICD-10-CM | POA: Diagnosis not present

## 2019-09-13 DIAGNOSIS — Z9181 History of falling: Secondary | ICD-10-CM | POA: Diagnosis not present

## 2019-09-13 DIAGNOSIS — E785 Hyperlipidemia, unspecified: Secondary | ICD-10-CM | POA: Diagnosis not present

## 2019-10-06 DIAGNOSIS — K219 Gastro-esophageal reflux disease without esophagitis: Secondary | ICD-10-CM | POA: Diagnosis not present

## 2019-10-06 DIAGNOSIS — Z86010 Personal history of colonic polyps: Secondary | ICD-10-CM | POA: Diagnosis not present

## 2019-10-07 ENCOUNTER — Telehealth: Payer: Self-pay | Admitting: *Deleted
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
## 2019-10-07 NOTE — Telephone Encounter (Signed)
   [facility] Pre-operative Risk Assessment    Request for surgical clearance:  1. What type of surgery is being performed? Colonoscopy and EGD  2. When is this surgery scheduled? 11/01/19   3. What type of clearance is required (medical clearance vs. Pharmacy clearance to hold med vs. Both)? Pharmacy  4. Are there any medications that need to be held prior to surgery and how long?Aspirin and Prasugrel  5. Practice name and name of physician performing surgery? [facility]. Dr. Thornton Park, MD   6. What is your office phone number [phone]    7.   What is your office fax number [phone]  8.   Anesthesia type (None, local, MAC, general) Not stated   Roselie Skinner 10/07/2019, 10:05 AM  _________________________________________________________________   (provider comments below)

## 2019-10-10 ENCOUNTER — Telehealth: Payer: Self-pay

## 2019-10-10 NOTE — Telephone Encounter (Addendum)
Please inform the requesting provider that procedure needs to be delayed until patient can be seen by Dr. Bettina Gavia. We plan to arrange an earlier visit. See separate cardiac clearance note on more detail  Duplicate request

## 2019-10-10 NOTE — Telephone Encounter (Signed)
Reviewed by Dr. Bettina Gavia who recommended a office visit prior to formal clearance given recent ED visit. Further recommendation regarding aspirin and Effient will be made during preop clearance visit. Patient is currently scheduled to see Dr. Bettina Gavia after the procedure, callback pool to see if the office visit can be moved to a earlier date? If not, then GI procedure will need to be postponed a few days.   It generally takes aspirin and effient 7 days to be washed out prior to the procedure, therefore, follow up visit needs to be at least 7 days prior to the date for procedure to allow enough holding time.

## 2019-10-10 NOTE — Telephone Encounter (Addendum)
Called the requesting office and spoke with Santiago Glad informed her that patient had a recent ER visit and her cardiologist would like for her to have an office visit prior to making a decision. Informed her that the patient is scheduled to see Dr. Bettina Gavia on 11/09/19 a later date from scheduled procedure date of 11/01/19. I also informed her that it generally takes aspirin and effient 7 days to be washed out prior to the procedure, therefore, follow up visit needs to be at least 7 days prior to the date for procedure to allow enough holding time.  She thanked me for call and stated that she will pass along the information and that the procedure may have to be rescheduled .

## 2019-10-10 NOTE — Telephone Encounter (Signed)
° °   [facility] Pre-operative Risk Assessment    HEARTCARE STAFF: - Please ensure there is not already an duplicate clearance open for this procedure. - Under Visit Info/Reason for Call, type in Other and utilize the format Clearance MM/DD/YY or Clearance TBD. Do not use dashes or single digits. - If request is for dental extraction, please clarify the # of teeth to be extracted.  Request for surgical clearance:  1. What type of surgery is being performed? Colonoscopy and EGD   2. When is this surgery scheduled? 11/08/19   3. What type of clearance is required (medical clearance vs. Pharmacy clearance to hold med vs. Both)? Both  4. Are there any medications that need to be held prior to surgery and how long?Effient on 11/01/19 and Aspirin on 11/03/19.   5. Practice name and name of physician performing surgery? [facility]   6. What is the office phone number? [phone]   7.   What is the office fax number? [phone]  8.   Anesthesia type (None, local, MAC, general) ? Unspecified   Gita Kudo 10/10/2019, 8:51 AM  _________________________________________________________________   (provider comments below)

## 2019-10-10 NOTE — Telephone Encounter (Signed)
Spoke with the patient just now and got her moved up in our schedule to 10/20/19. She verbalizes understanding and thanks me for the call back.    Encouraged patient to call back with any questions or concerns.

## 2019-10-10 NOTE — Telephone Encounter (Signed)
She had a recent ED visits [facility] with chest pain and shortness of breath I think she should have an office visit prior to making a decision regarding withdrawing aspirin and prasugrel for colonoscopy.

## 2019-10-11 DIAGNOSIS — C642 Malignant neoplasm of left kidney, except renal pelvis: Secondary | ICD-10-CM | POA: Diagnosis not present

## 2019-10-11 DIAGNOSIS — N281 Cyst of kidney, acquired: Secondary | ICD-10-CM | POA: Diagnosis not present

## 2019-10-20 ENCOUNTER — Encounter: Payer: Self-pay | Admitting: Cardiology

## 2019-10-20 ENCOUNTER — Other Ambulatory Visit: Payer: Self-pay

## 2019-10-20 ENCOUNTER — Ambulatory Visit (INDEPENDENT_AMBULATORY_CARE_PROVIDER_SITE_OTHER): Payer: PPO | Admitting: Cardiology

## 2019-10-20 VITALS — BP 130/62 | HR 60 | Ht 61.0 in | Wt 182.8 lb

## 2019-10-20 DIAGNOSIS — Z01810 Encounter for preprocedural cardiovascular examination: Secondary | ICD-10-CM | POA: Diagnosis not present

## 2019-10-20 DIAGNOSIS — N183 Chronic kidney disease, stage 3 unspecified: Secondary | ICD-10-CM

## 2019-10-20 DIAGNOSIS — I10 Essential (primary) hypertension: Secondary | ICD-10-CM | POA: Diagnosis not present

## 2019-10-20 DIAGNOSIS — E782 Mixed hyperlipidemia: Secondary | ICD-10-CM

## 2019-10-20 DIAGNOSIS — I25119 Atherosclerotic heart disease of native coronary artery with unspecified angina pectoris: Secondary | ICD-10-CM

## 2019-10-20 NOTE — Patient Instructions (Signed)
Medication Instructions:  Your physician recommends that you continue on your current medications as directed. Please refer to the Current Medication list given to you today.  Please stop your aspirin and prasugrel one week before your surgery. After surgery do not restart your prasugrel. Restart Aspirin 1-2 days afterward. *If you need a refill on your cardiac medications before your next appointment, please call your pharmacy*   Lab Work: None If you have labs (blood work) drawn today and your tests are completely normal, you will receive your results only by:  [place] (if you have MyChart) OR  A paper copy in the mail If you have any lab test that is abnormal or we need to change your treatment, we will call you to review the results.   Testing/Procedures: None   Follow-Up: At [facility], you and your health needs are our priority.  As part of our continuing mission to provide you with exceptional heart care, we have created designated Provider Care Teams.  These Care Teams include your primary Cardiologist (physician) and Advanced Practice Providers (APPs -  Physician Assistants and Nurse Practitioners) who all work together to provide you with the care you need, when you need it.  We recommend signing up for the patient portal called "MyChart".  Sign up information is provided on this After Visit Summary.  MyChart is used to connect with patients for Virtual Visits (Telemedicine).  Patients are able to view lab/test results, encounter notes, upcoming appointments, etc.  Non-urgent messages can be sent to your provider as well.   To learn more about what you can do with MyChart, go to [email or web address].    Your next appointment:   1 year(s)  The format for your next appointment:   In Person  Provider:   Shirlee More, MD   Other Instructions
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
## 2019-11-02 DIAGNOSIS — Z20828 Contact with and (suspected) exposure to other viral communicable diseases: Secondary | ICD-10-CM | POA: Diagnosis not present

## 2019-11-02 DIAGNOSIS — Z1159 Encounter for screening for other viral diseases: Secondary | ICD-10-CM | POA: Diagnosis not present

## 2019-11-08 DIAGNOSIS — D649 Anemia, unspecified: Secondary | ICD-10-CM | POA: Diagnosis not present

## 2019-11-08 DIAGNOSIS — Z7982 Long term (current) use of aspirin: Secondary | ICD-10-CM | POA: Diagnosis not present

## 2019-11-08 DIAGNOSIS — E119 Type 2 diabetes mellitus without complications: Secondary | ICD-10-CM | POA: Diagnosis not present

## 2019-11-08 DIAGNOSIS — K219 Gastro-esophageal reflux disease without esophagitis: Secondary | ICD-10-CM | POA: Diagnosis not present

## 2019-11-08 DIAGNOSIS — K317 Polyp of stomach and duodenum: Secondary | ICD-10-CM | POA: Diagnosis not present

## 2019-11-08 DIAGNOSIS — K449 Diaphragmatic hernia without obstruction or gangrene: Secondary | ICD-10-CM | POA: Diagnosis not present

## 2019-11-08 DIAGNOSIS — D131 Benign neoplasm of stomach: Secondary | ICD-10-CM | POA: Diagnosis not present

## 2019-11-08 DIAGNOSIS — I252 Old myocardial infarction: Secondary | ICD-10-CM | POA: Diagnosis not present

## 2019-11-08 DIAGNOSIS — I10 Essential (primary) hypertension: Secondary | ICD-10-CM | POA: Diagnosis not present

## 2019-11-08 DIAGNOSIS — D500 Iron deficiency anemia secondary to blood loss (chronic): Secondary | ICD-10-CM | POA: Diagnosis not present

## 2019-11-08 DIAGNOSIS — K222 Esophageal obstruction: Secondary | ICD-10-CM | POA: Diagnosis not present

## 2019-11-08 DIAGNOSIS — Z86010 Personal history of colonic polyps: Secondary | ICD-10-CM | POA: Diagnosis not present

## 2019-11-08 DIAGNOSIS — Z09 Encounter for follow-up examination after completed treatment for conditions other than malignant neoplasm: Secondary | ICD-10-CM | POA: Diagnosis not present

## 2019-11-08 DIAGNOSIS — Z79899 Other long term (current) drug therapy: Secondary | ICD-10-CM | POA: Diagnosis not present

## 2019-11-09 ENCOUNTER — Ambulatory Visit: Payer: PPO | Admitting: Cardiology

## 2019-11-10 DIAGNOSIS — Z1231 Encounter for screening mammogram for malignant neoplasm of breast: Secondary | ICD-10-CM | POA: Diagnosis not present

## 2019-12-08 DIAGNOSIS — I129 Hypertensive chronic kidney disease with stage 1 through stage 4 chronic kidney disease, or unspecified chronic kidney disease: Secondary | ICD-10-CM | POA: Diagnosis not present

## 2019-12-08 DIAGNOSIS — I2510 Atherosclerotic heart disease of native coronary artery without angina pectoris: Secondary | ICD-10-CM | POA: Diagnosis not present

## 2019-12-08 DIAGNOSIS — E1165 Type 2 diabetes mellitus with hyperglycemia: Secondary | ICD-10-CM | POA: Diagnosis not present

## 2019-12-08 DIAGNOSIS — H9193 Unspecified hearing loss, bilateral: Secondary | ICD-10-CM | POA: Diagnosis not present

## 2019-12-08 DIAGNOSIS — D509 Iron deficiency anemia, unspecified: Secondary | ICD-10-CM | POA: Diagnosis not present

## 2019-12-08 DIAGNOSIS — Z9181 History of falling: Secondary | ICD-10-CM | POA: Diagnosis not present

## 2019-12-08 DIAGNOSIS — R809 Proteinuria, unspecified: Secondary | ICD-10-CM | POA: Diagnosis not present

## 2019-12-08 DIAGNOSIS — N183 Chronic kidney disease, stage 3 unspecified: Secondary | ICD-10-CM | POA: Diagnosis not present

## 2019-12-08 DIAGNOSIS — E1129 Type 2 diabetes mellitus with other diabetic kidney complication: Secondary | ICD-10-CM | POA: Diagnosis not present

## 2019-12-08 DIAGNOSIS — F329 Major depressive disorder, single episode, unspecified: Secondary | ICD-10-CM | POA: Diagnosis not present

## 2019-12-08 DIAGNOSIS — E785 Hyperlipidemia, unspecified: Secondary | ICD-10-CM | POA: Diagnosis not present

## 2019-12-08 DIAGNOSIS — Z9861 Coronary angioplasty status: Secondary | ICD-10-CM | POA: Diagnosis not present

## 2019-12-12 DIAGNOSIS — D500 Iron deficiency anemia secondary to blood loss (chronic): Secondary | ICD-10-CM | POA: Diagnosis not present

## 2019-12-12 DIAGNOSIS — K219 Gastro-esophageal reflux disease without esophagitis: Secondary | ICD-10-CM | POA: Diagnosis not present

## 2019-12-14 DIAGNOSIS — R1310 Dysphagia, unspecified: Secondary | ICD-10-CM | POA: Diagnosis not present

## 2019-12-14 DIAGNOSIS — K449 Diaphragmatic hernia without obstruction or gangrene: Secondary | ICD-10-CM | POA: Diagnosis not present

## 2019-12-28 DIAGNOSIS — Z79899 Other long term (current) drug therapy: Secondary | ICD-10-CM | POA: Diagnosis not present

## 2019-12-28 DIAGNOSIS — E119 Type 2 diabetes mellitus without complications: Secondary | ICD-10-CM | POA: Diagnosis not present

## 2019-12-28 DIAGNOSIS — K219 Gastro-esophageal reflux disease without esophagitis: Secondary | ICD-10-CM | POA: Diagnosis not present

## 2019-12-28 DIAGNOSIS — M1990 Unspecified osteoarthritis, unspecified site: Secondary | ICD-10-CM | POA: Diagnosis not present

## 2019-12-28 DIAGNOSIS — I10 Essential (primary) hypertension: Secondary | ICD-10-CM | POA: Diagnosis not present

## 2019-12-28 DIAGNOSIS — R1312 Dysphagia, oropharyngeal phase: Secondary | ICD-10-CM | POA: Diagnosis not present

## 2019-12-28 DIAGNOSIS — Z862 Personal history of diseases of the blood and blood-forming organs and certain disorders involving the immune mechanism: Secondary | ICD-10-CM | POA: Diagnosis not present

## 2019-12-28 DIAGNOSIS — I252 Old myocardial infarction: Secondary | ICD-10-CM | POA: Diagnosis not present

## 2020-01-05 DIAGNOSIS — H903 Sensorineural hearing loss, bilateral: Secondary | ICD-10-CM | POA: Diagnosis not present

## 2020-01-05 DIAGNOSIS — Z974 Presence of external hearing-aid: Secondary | ICD-10-CM | POA: Diagnosis not present

## 2020-01-05 DIAGNOSIS — J342 Deviated nasal septum: Secondary | ICD-10-CM | POA: Diagnosis not present

## 2020-02-01 ENCOUNTER — Other Ambulatory Visit: Payer: Self-pay | Admitting: Cardiology

## 2020-02-01 NOTE — Telephone Encounter (Signed)
Rx refill sent to pharmacy. 

## 2020-02-27 DIAGNOSIS — R051 Acute cough: Secondary | ICD-10-CM | POA: Diagnosis not present

## 2020-02-27 DIAGNOSIS — Z20828 Contact with and (suspected) exposure to other viral communicable diseases: Secondary | ICD-10-CM | POA: Diagnosis not present

## 2020-02-27 DIAGNOSIS — R1110 Vomiting, unspecified: Secondary | ICD-10-CM | POA: Diagnosis not present

## 2020-02-27 DIAGNOSIS — R519 Headache, unspecified: Secondary | ICD-10-CM | POA: Diagnosis not present

## 2020-03-06 ENCOUNTER — Other Ambulatory Visit: Payer: Self-pay | Admitting: Cardiology

## 2020-03-06 NOTE — Telephone Encounter (Signed)
Rx refill sent to pharmacy. 

## 2020-03-08 DIAGNOSIS — N183 Chronic kidney disease, stage 3 unspecified: Secondary | ICD-10-CM | POA: Diagnosis not present

## 2020-03-08 DIAGNOSIS — Z9861 Coronary angioplasty status: Secondary | ICD-10-CM | POA: Diagnosis not present

## 2020-03-08 DIAGNOSIS — E785 Hyperlipidemia, unspecified: Secondary | ICD-10-CM | POA: Diagnosis not present

## 2020-03-08 DIAGNOSIS — R399 Unspecified symptoms and signs involving the genitourinary system: Secondary | ICD-10-CM | POA: Diagnosis not present

## 2020-03-08 DIAGNOSIS — I2510 Atherosclerotic heart disease of native coronary artery without angina pectoris: Secondary | ICD-10-CM | POA: Diagnosis not present

## 2020-03-08 DIAGNOSIS — E1165 Type 2 diabetes mellitus with hyperglycemia: Secondary | ICD-10-CM | POA: Diagnosis not present

## 2020-03-08 DIAGNOSIS — E1129 Type 2 diabetes mellitus with other diabetic kidney complication: Secondary | ICD-10-CM | POA: Diagnosis not present

## 2020-03-08 DIAGNOSIS — I129 Hypertensive chronic kidney disease with stage 1 through stage 4 chronic kidney disease, or unspecified chronic kidney disease: Secondary | ICD-10-CM | POA: Diagnosis not present

## 2020-03-08 DIAGNOSIS — D509 Iron deficiency anemia, unspecified: Secondary | ICD-10-CM | POA: Diagnosis not present

## 2020-03-08 DIAGNOSIS — R809 Proteinuria, unspecified: Secondary | ICD-10-CM | POA: Diagnosis not present

## 2020-03-08 DIAGNOSIS — F32A Depression, unspecified: Secondary | ICD-10-CM | POA: Diagnosis not present

## 2020-04-16 DIAGNOSIS — R399 Unspecified symptoms and signs involving the genitourinary system: Secondary | ICD-10-CM | POA: Diagnosis not present

## 2020-06-07 DIAGNOSIS — D509 Iron deficiency anemia, unspecified: Secondary | ICD-10-CM | POA: Diagnosis not present

## 2020-06-07 DIAGNOSIS — F320 Major depressive disorder, single episode, mild: Secondary | ICD-10-CM | POA: Diagnosis not present

## 2020-06-07 DIAGNOSIS — E785 Hyperlipidemia, unspecified: Secondary | ICD-10-CM | POA: Diagnosis not present

## 2020-06-07 DIAGNOSIS — I2510 Atherosclerotic heart disease of native coronary artery without angina pectoris: Secondary | ICD-10-CM | POA: Diagnosis not present

## 2020-06-07 DIAGNOSIS — E1129 Type 2 diabetes mellitus with other diabetic kidney complication: Secondary | ICD-10-CM | POA: Diagnosis not present

## 2020-06-07 DIAGNOSIS — R809 Proteinuria, unspecified: Secondary | ICD-10-CM | POA: Diagnosis not present

## 2020-06-07 DIAGNOSIS — N183 Chronic kidney disease, stage 3 unspecified: Secondary | ICD-10-CM | POA: Diagnosis not present

## 2020-06-07 DIAGNOSIS — E1165 Type 2 diabetes mellitus with hyperglycemia: Secondary | ICD-10-CM | POA: Diagnosis not present

## 2020-06-07 DIAGNOSIS — I129 Hypertensive chronic kidney disease with stage 1 through stage 4 chronic kidney disease, or unspecified chronic kidney disease: Secondary | ICD-10-CM | POA: Diagnosis not present

## 2020-06-07 DIAGNOSIS — Z9861 Coronary angioplasty status: Secondary | ICD-10-CM | POA: Diagnosis not present

## 2020-09-04 DIAGNOSIS — Z20822 Contact with and (suspected) exposure to covid-19: Secondary | ICD-10-CM | POA: Diagnosis not present

## 2020-09-07 DIAGNOSIS — D509 Iron deficiency anemia, unspecified: Secondary | ICD-10-CM | POA: Diagnosis not present

## 2020-09-07 DIAGNOSIS — N183 Chronic kidney disease, stage 3 unspecified: Secondary | ICD-10-CM | POA: Diagnosis not present

## 2020-09-07 DIAGNOSIS — E1129 Type 2 diabetes mellitus with other diabetic kidney complication: Secondary | ICD-10-CM | POA: Diagnosis not present

## 2020-09-07 DIAGNOSIS — R5383 Other fatigue: Secondary | ICD-10-CM | POA: Diagnosis not present

## 2020-09-07 DIAGNOSIS — I2510 Atherosclerotic heart disease of native coronary artery without angina pectoris: Secondary | ICD-10-CM | POA: Diagnosis not present

## 2020-09-07 DIAGNOSIS — E538 Deficiency of other specified B group vitamins: Secondary | ICD-10-CM | POA: Diagnosis not present

## 2020-09-07 DIAGNOSIS — F320 Major depressive disorder, single episode, mild: Secondary | ICD-10-CM | POA: Diagnosis not present

## 2020-09-07 DIAGNOSIS — R809 Proteinuria, unspecified: Secondary | ICD-10-CM | POA: Diagnosis not present

## 2020-09-07 DIAGNOSIS — E785 Hyperlipidemia, unspecified: Secondary | ICD-10-CM | POA: Diagnosis not present

## 2020-09-07 DIAGNOSIS — Z139 Encounter for screening, unspecified: Secondary | ICD-10-CM | POA: Diagnosis not present

## 2020-09-07 DIAGNOSIS — Z9861 Coronary angioplasty status: Secondary | ICD-10-CM | POA: Diagnosis not present

## 2020-09-11 DIAGNOSIS — Z85528 Personal history of other malignant neoplasm of kidney: Secondary | ICD-10-CM | POA: Diagnosis not present

## 2020-09-11 DIAGNOSIS — I2510 Atherosclerotic heart disease of native coronary artery without angina pectoris: Secondary | ICD-10-CM | POA: Diagnosis not present

## 2020-09-11 DIAGNOSIS — Z7984 Long term (current) use of oral hypoglycemic drugs: Secondary | ICD-10-CM | POA: Diagnosis not present

## 2020-09-11 DIAGNOSIS — I129 Hypertensive chronic kidney disease with stage 1 through stage 4 chronic kidney disease, or unspecified chronic kidney disease: Secondary | ICD-10-CM | POA: Diagnosis not present

## 2020-09-11 DIAGNOSIS — Z905 Acquired absence of kidney: Secondary | ICD-10-CM | POA: Diagnosis not present

## 2020-09-11 DIAGNOSIS — F325 Major depressive disorder, single episode, in full remission: Secondary | ICD-10-CM | POA: Diagnosis not present

## 2020-09-11 DIAGNOSIS — M159 Polyosteoarthritis, unspecified: Secondary | ICD-10-CM | POA: Diagnosis not present

## 2020-09-11 DIAGNOSIS — N183 Chronic kidney disease, stage 3 unspecified: Secondary | ICD-10-CM | POA: Diagnosis not present

## 2020-09-11 DIAGNOSIS — I700 Atherosclerosis of aorta: Secondary | ICD-10-CM | POA: Diagnosis not present

## 2020-09-11 DIAGNOSIS — E1122 Type 2 diabetes mellitus with diabetic chronic kidney disease: Secondary | ICD-10-CM | POA: Diagnosis not present

## 2020-11-01 ENCOUNTER — Other Ambulatory Visit: Payer: Self-pay | Admitting: Cardiology

## 2020-11-28 DIAGNOSIS — E669 Obesity, unspecified: Secondary | ICD-10-CM | POA: Diagnosis not present

## 2020-11-28 DIAGNOSIS — Z9181 History of falling: Secondary | ICD-10-CM | POA: Diagnosis not present

## 2020-11-28 DIAGNOSIS — E785 Hyperlipidemia, unspecified: Secondary | ICD-10-CM | POA: Diagnosis not present

## 2020-11-28 DIAGNOSIS — K5732 Diverticulitis of large intestine without perforation or abscess without bleeding: Secondary | ICD-10-CM | POA: Insufficient documentation

## 2020-11-28 DIAGNOSIS — Z1331 Encounter for screening for depression: Secondary | ICD-10-CM | POA: Diagnosis not present

## 2020-11-28 DIAGNOSIS — M47812 Spondylosis without myelopathy or radiculopathy, cervical region: Secondary | ICD-10-CM | POA: Insufficient documentation

## 2020-11-28 DIAGNOSIS — Z0000 Encounter for general adult medical examination without abnormal findings: Secondary | ICD-10-CM | POA: Diagnosis not present

## 2020-11-28 DIAGNOSIS — G2581 Restless legs syndrome: Secondary | ICD-10-CM | POA: Insufficient documentation

## 2020-11-28 DIAGNOSIS — K635 Polyp of colon: Secondary | ICD-10-CM | POA: Insufficient documentation

## 2020-11-28 DIAGNOSIS — D509 Iron deficiency anemia, unspecified: Secondary | ICD-10-CM | POA: Insufficient documentation

## 2020-12-04 DIAGNOSIS — N952 Postmenopausal atrophic vaginitis: Secondary | ICD-10-CM | POA: Diagnosis not present

## 2020-12-10 DIAGNOSIS — Z1231 Encounter for screening mammogram for malignant neoplasm of breast: Secondary | ICD-10-CM | POA: Diagnosis not present

## 2020-12-13 DIAGNOSIS — E1129 Type 2 diabetes mellitus with other diabetic kidney complication: Secondary | ICD-10-CM | POA: Diagnosis not present

## 2020-12-13 DIAGNOSIS — N183 Chronic kidney disease, stage 3 unspecified: Secondary | ICD-10-CM | POA: Diagnosis not present

## 2020-12-13 DIAGNOSIS — I129 Hypertensive chronic kidney disease with stage 1 through stage 4 chronic kidney disease, or unspecified chronic kidney disease: Secondary | ICD-10-CM | POA: Diagnosis not present

## 2020-12-13 DIAGNOSIS — D509 Iron deficiency anemia, unspecified: Secondary | ICD-10-CM | POA: Diagnosis not present

## 2020-12-13 DIAGNOSIS — Z23 Encounter for immunization: Secondary | ICD-10-CM | POA: Diagnosis not present

## 2020-12-13 DIAGNOSIS — F320 Major depressive disorder, single episode, mild: Secondary | ICD-10-CM | POA: Diagnosis not present

## 2020-12-13 DIAGNOSIS — E785 Hyperlipidemia, unspecified: Secondary | ICD-10-CM | POA: Diagnosis not present

## 2020-12-13 DIAGNOSIS — I2510 Atherosclerotic heart disease of native coronary artery without angina pectoris: Secondary | ICD-10-CM | POA: Diagnosis not present

## 2020-12-13 DIAGNOSIS — R809 Proteinuria, unspecified: Secondary | ICD-10-CM | POA: Diagnosis not present

## 2020-12-13 DIAGNOSIS — Z9861 Coronary angioplasty status: Secondary | ICD-10-CM | POA: Diagnosis not present

## 2020-12-19 ENCOUNTER — Ambulatory Visit: Payer: PPO | Admitting: Cardiology

## 2021-01-17 DIAGNOSIS — Z85528 Personal history of other malignant neoplasm of kidney: Secondary | ICD-10-CM | POA: Diagnosis not present

## 2021-01-17 DIAGNOSIS — N1831 Chronic kidney disease, stage 3a: Secondary | ICD-10-CM | POA: Diagnosis not present

## 2021-01-17 DIAGNOSIS — C642 Malignant neoplasm of left kidney, except renal pelvis: Secondary | ICD-10-CM | POA: Diagnosis not present

## 2021-01-25 DIAGNOSIS — E785 Hyperlipidemia, unspecified: Secondary | ICD-10-CM | POA: Diagnosis not present

## 2021-01-25 DIAGNOSIS — E1129 Type 2 diabetes mellitus with other diabetic kidney complication: Secondary | ICD-10-CM | POA: Diagnosis not present

## 2021-01-30 ENCOUNTER — Telehealth: Payer: Self-pay | Admitting: Cardiology

## 2021-01-30 NOTE — Telephone Encounter (Signed)
  Pt c/o medication issue:  1. Name of Medication: ranolazine (RANEXA) 1000 MG SR tablet  2. How are you currently taking this medication (dosage and times per day)? TAKE 1 TABLET(1000 MG) BY MOUTH TWICE DAILY  3. Are you having a reaction (difficulty breathing--STAT)?   4. What is your medication issue? April pharmD with [facility], she would like to report to Dr. Bettina Gavia pt's GFR dropped below 50 and wondering if pt needs to adjust her ranexa dosage to 500 mg twice a day

## 2021-01-31 NOTE — Telephone Encounter (Signed)
Julia Logan with Chronic med team has been advised of Dr. Joya Gaskins recommendations.
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
## 2021-02-04 ENCOUNTER — Ambulatory Visit: Payer: PPO | Admitting: Cardiology

## 2021-02-04 ENCOUNTER — Other Ambulatory Visit: Payer: Self-pay

## 2021-02-04 ENCOUNTER — Encounter: Payer: Self-pay | Admitting: Cardiology

## 2021-02-04 VITALS — BP 128/72 | HR 58 | Ht 61.0 in | Wt 175.4 lb

## 2021-02-04 DIAGNOSIS — D508 Other iron deficiency anemias: Secondary | ICD-10-CM | POA: Diagnosis not present

## 2021-02-04 DIAGNOSIS — E782 Mixed hyperlipidemia: Secondary | ICD-10-CM | POA: Diagnosis not present

## 2021-02-04 DIAGNOSIS — N183 Chronic kidney disease, stage 3 unspecified: Secondary | ICD-10-CM | POA: Diagnosis not present

## 2021-02-04 DIAGNOSIS — I25119 Atherosclerotic heart disease of native coronary artery with unspecified angina pectoris: Secondary | ICD-10-CM | POA: Diagnosis not present

## 2021-02-04 DIAGNOSIS — I10 Essential (primary) hypertension: Secondary | ICD-10-CM

## 2021-02-04 DIAGNOSIS — I129 Hypertensive chronic kidney disease with stage 1 through stage 4 chronic kidney disease, or unspecified chronic kidney disease: Secondary | ICD-10-CM

## 2021-02-04 NOTE — Patient Instructions (Signed)
Medication Instructions:  Your physician has recommended you make the following change in your medication:  DECREASE: Iron to one tablet by mouth every other day.  *If you need a refill on your cardiac medications before your next appointment, please call your pharmacy*   Lab Work: None If you have labs (blood work) drawn today and your tests are completely normal, you will receive your results only by: [place] (if you have MyChart) OR A paper copy in the mail If you have any lab test that is abnormal or we need to change your treatment, we will call you to review the results.   Testing/Procedures: None   Follow-Up: At [facility], you and your health needs are our priority.  As part of our continuing mission to provide you with exceptional heart care, we have created designated Provider Care Teams.  These Care Teams include your primary Cardiologist (physician) and Advanced Practice Providers (APPs -  Physician Assistants and Nurse Practitioners) who all work together to provide you with the care you need, when you need it.  We recommend signing up for the patient portal called "MyChart".  Sign up information is provided on this After Visit Summary.  MyChart is used to connect with patients for Virtual Visits (Telemedicine).  Patients are able to view lab/test results, encounter notes, upcoming appointments, etc.  Non-urgent messages can be sent to your provider as well.   To learn more about what you can do with MyChart, go to [email or web address].    Your next appointment:   1 year(s)  The format for your next appointment:   In Person  Provider:   Shirlee More, MD    Other Instructions
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
## 2021-02-08 DIAGNOSIS — C649 Malignant neoplasm of unspecified kidney, except renal pelvis: Secondary | ICD-10-CM | POA: Diagnosis not present

## 2021-02-08 DIAGNOSIS — Z85528 Personal history of other malignant neoplasm of kidney: Secondary | ICD-10-CM | POA: Diagnosis not present

## 2021-02-08 DIAGNOSIS — K449 Diaphragmatic hernia without obstruction or gangrene: Secondary | ICD-10-CM | POA: Diagnosis not present

## 2021-02-08 DIAGNOSIS — Z9889 Other specified postprocedural states: Secondary | ICD-10-CM | POA: Diagnosis not present

## 2021-02-20 DIAGNOSIS — J208 Acute bronchitis due to other specified organisms: Secondary | ICD-10-CM | POA: Diagnosis not present

## 2021-02-20 DIAGNOSIS — B9689 Other specified bacterial agents as the cause of diseases classified elsewhere: Secondary | ICD-10-CM | POA: Diagnosis not present

## 2021-03-15 DIAGNOSIS — D509 Iron deficiency anemia, unspecified: Secondary | ICD-10-CM | POA: Diagnosis not present

## 2021-03-15 DIAGNOSIS — I129 Hypertensive chronic kidney disease with stage 1 through stage 4 chronic kidney disease, or unspecified chronic kidney disease: Secondary | ICD-10-CM | POA: Diagnosis not present

## 2021-03-15 DIAGNOSIS — E785 Hyperlipidemia, unspecified: Secondary | ICD-10-CM | POA: Diagnosis not present

## 2021-03-15 DIAGNOSIS — R809 Proteinuria, unspecified: Secondary | ICD-10-CM | POA: Diagnosis not present

## 2021-03-15 DIAGNOSIS — I2510 Atherosclerotic heart disease of native coronary artery without angina pectoris: Secondary | ICD-10-CM | POA: Diagnosis not present

## 2021-03-15 DIAGNOSIS — F320 Major depressive disorder, single episode, mild: Secondary | ICD-10-CM | POA: Diagnosis not present

## 2021-03-15 DIAGNOSIS — Z9861 Coronary angioplasty status: Secondary | ICD-10-CM | POA: Diagnosis not present

## 2021-03-15 DIAGNOSIS — N183 Chronic kidney disease, stage 3 unspecified: Secondary | ICD-10-CM | POA: Diagnosis not present

## 2021-03-15 DIAGNOSIS — E1129 Type 2 diabetes mellitus with other diabetic kidney complication: Secondary | ICD-10-CM | POA: Diagnosis not present

## 2021-05-22 DIAGNOSIS — J0100 Acute maxillary sinusitis, unspecified: Secondary | ICD-10-CM | POA: Diagnosis not present

## 2021-05-22 DIAGNOSIS — R519 Headache, unspecified: Secondary | ICD-10-CM | POA: Diagnosis not present

## 2021-05-22 DIAGNOSIS — J349 Unspecified disorder of nose and nasal sinuses: Secondary | ICD-10-CM | POA: Diagnosis not present

## 2021-05-22 DIAGNOSIS — R051 Acute cough: Secondary | ICD-10-CM | POA: Diagnosis not present

## 2021-05-22 DIAGNOSIS — J209 Acute bronchitis, unspecified: Secondary | ICD-10-CM | POA: Diagnosis not present

## 2021-05-22 DIAGNOSIS — Z20828 Contact with and (suspected) exposure to other viral communicable diseases: Secondary | ICD-10-CM | POA: Diagnosis not present

## 2021-05-24 DIAGNOSIS — J208 Acute bronchitis due to other specified organisms: Secondary | ICD-10-CM | POA: Diagnosis not present

## 2021-05-24 DIAGNOSIS — B9689 Other specified bacterial agents as the cause of diseases classified elsewhere: Secondary | ICD-10-CM | POA: Diagnosis not present

## 2021-06-12 DIAGNOSIS — E1129 Type 2 diabetes mellitus with other diabetic kidney complication: Secondary | ICD-10-CM | POA: Diagnosis not present

## 2021-06-12 DIAGNOSIS — N183 Chronic kidney disease, stage 3 unspecified: Secondary | ICD-10-CM | POA: Diagnosis not present

## 2021-06-12 DIAGNOSIS — D509 Iron deficiency anemia, unspecified: Secondary | ICD-10-CM | POA: Diagnosis not present

## 2021-06-12 DIAGNOSIS — F320 Major depressive disorder, single episode, mild: Secondary | ICD-10-CM | POA: Diagnosis not present

## 2021-06-12 DIAGNOSIS — E785 Hyperlipidemia, unspecified: Secondary | ICD-10-CM | POA: Diagnosis not present

## 2021-06-12 DIAGNOSIS — I129 Hypertensive chronic kidney disease with stage 1 through stage 4 chronic kidney disease, or unspecified chronic kidney disease: Secondary | ICD-10-CM | POA: Diagnosis not present

## 2021-06-12 DIAGNOSIS — Z9861 Coronary angioplasty status: Secondary | ICD-10-CM | POA: Diagnosis not present

## 2021-06-12 DIAGNOSIS — I2510 Atherosclerotic heart disease of native coronary artery without angina pectoris: Secondary | ICD-10-CM | POA: Diagnosis not present

## 2021-06-12 DIAGNOSIS — R809 Proteinuria, unspecified: Secondary | ICD-10-CM | POA: Diagnosis not present

## 2021-06-14 DIAGNOSIS — N183 Chronic kidney disease, stage 3 unspecified: Secondary | ICD-10-CM | POA: Diagnosis not present

## 2021-06-14 DIAGNOSIS — E1129 Type 2 diabetes mellitus with other diabetic kidney complication: Secondary | ICD-10-CM | POA: Diagnosis not present

## 2021-06-14 DIAGNOSIS — E785 Hyperlipidemia, unspecified: Secondary | ICD-10-CM | POA: Diagnosis not present

## 2021-07-30 ENCOUNTER — Other Ambulatory Visit: Payer: Self-pay | Admitting: Cardiology

## 2021-09-12 DIAGNOSIS — R110 Nausea: Secondary | ICD-10-CM | POA: Diagnosis not present

## 2021-09-12 DIAGNOSIS — I129 Hypertensive chronic kidney disease with stage 1 through stage 4 chronic kidney disease, or unspecified chronic kidney disease: Secondary | ICD-10-CM | POA: Diagnosis not present

## 2021-09-12 DIAGNOSIS — F320 Major depressive disorder, single episode, mild: Secondary | ICD-10-CM | POA: Diagnosis not present

## 2021-09-12 DIAGNOSIS — N183 Chronic kidney disease, stage 3 unspecified: Secondary | ICD-10-CM | POA: Diagnosis not present

## 2021-09-12 DIAGNOSIS — E1129 Type 2 diabetes mellitus with other diabetic kidney complication: Secondary | ICD-10-CM | POA: Diagnosis not present

## 2021-09-12 DIAGNOSIS — E785 Hyperlipidemia, unspecified: Secondary | ICD-10-CM | POA: Diagnosis not present

## 2021-09-12 DIAGNOSIS — D509 Iron deficiency anemia, unspecified: Secondary | ICD-10-CM | POA: Diagnosis not present

## 2021-09-12 DIAGNOSIS — R809 Proteinuria, unspecified: Secondary | ICD-10-CM | POA: Diagnosis not present

## 2021-09-12 DIAGNOSIS — Z9861 Coronary angioplasty status: Secondary | ICD-10-CM | POA: Diagnosis not present

## 2021-09-12 DIAGNOSIS — I2510 Atherosclerotic heart disease of native coronary artery without angina pectoris: Secondary | ICD-10-CM | POA: Diagnosis not present

## 2021-09-19 DIAGNOSIS — H524 Presbyopia: Secondary | ICD-10-CM | POA: Diagnosis not present

## 2021-09-19 DIAGNOSIS — H5203 Hypermetropia, bilateral: Secondary | ICD-10-CM | POA: Diagnosis not present

## 2021-09-19 DIAGNOSIS — H25813 Combined forms of age-related cataract, bilateral: Secondary | ICD-10-CM | POA: Diagnosis not present

## 2021-09-19 DIAGNOSIS — E119 Type 2 diabetes mellitus without complications: Secondary | ICD-10-CM | POA: Diagnosis not present

## 2021-09-19 DIAGNOSIS — I10 Essential (primary) hypertension: Secondary | ICD-10-CM | POA: Diagnosis not present

## 2021-09-19 DIAGNOSIS — Z7984 Long term (current) use of oral hypoglycemic drugs: Secondary | ICD-10-CM | POA: Diagnosis not present

## 2021-09-19 DIAGNOSIS — H52223 Regular astigmatism, bilateral: Secondary | ICD-10-CM | POA: Diagnosis not present

## 2021-09-25 DIAGNOSIS — H538 Other visual disturbances: Secondary | ICD-10-CM | POA: Diagnosis not present

## 2021-09-25 DIAGNOSIS — N179 Acute kidney failure, unspecified: Secondary | ICD-10-CM | POA: Diagnosis not present

## 2021-09-25 DIAGNOSIS — R519 Headache, unspecified: Secondary | ICD-10-CM | POA: Diagnosis not present

## 2021-10-03 DIAGNOSIS — G319 Degenerative disease of nervous system, unspecified: Secondary | ICD-10-CM | POA: Diagnosis not present

## 2021-10-03 DIAGNOSIS — R519 Headache, unspecified: Secondary | ICD-10-CM | POA: Diagnosis not present

## 2021-10-09 DIAGNOSIS — R519 Headache, unspecified: Secondary | ICD-10-CM | POA: Diagnosis not present

## 2021-10-09 DIAGNOSIS — R112 Nausea with vomiting, unspecified: Secondary | ICD-10-CM | POA: Diagnosis not present

## 2021-10-24 DIAGNOSIS — M25551 Pain in right hip: Secondary | ICD-10-CM | POA: Diagnosis not present

## 2021-10-24 DIAGNOSIS — M419 Scoliosis, unspecified: Secondary | ICD-10-CM | POA: Diagnosis not present

## 2021-10-24 DIAGNOSIS — M5136 Other intervertebral disc degeneration, lumbar region: Secondary | ICD-10-CM | POA: Diagnosis not present

## 2021-10-24 DIAGNOSIS — R296 Repeated falls: Secondary | ICD-10-CM | POA: Diagnosis not present

## 2021-10-24 DIAGNOSIS — S32020A Wedge compression fracture of second lumbar vertebra, initial encounter for closed fracture: Secondary | ICD-10-CM | POA: Diagnosis not present

## 2021-10-29 DIAGNOSIS — S32000A Wedge compression fracture of unspecified lumbar vertebra, initial encounter for closed fracture: Secondary | ICD-10-CM | POA: Diagnosis not present

## 2021-11-01 ENCOUNTER — Encounter: Payer: Self-pay | Admitting: Neurology

## 2021-11-01 ENCOUNTER — Ambulatory Visit (INDEPENDENT_AMBULATORY_CARE_PROVIDER_SITE_OTHER): Payer: PPO | Admitting: Neurology

## 2021-11-01 VITALS — BP 126/62 | HR 66 | Ht 61.0 in | Wt 155.4 lb

## 2021-11-01 DIAGNOSIS — R2689 Other abnormalities of gait and mobility: Secondary | ICD-10-CM

## 2021-11-01 DIAGNOSIS — M8000XA Age-related osteoporosis with current pathological fracture, unspecified site, initial encounter for fracture: Secondary | ICD-10-CM | POA: Diagnosis not present

## 2021-11-01 DIAGNOSIS — W19XXXA Unspecified fall, initial encounter: Secondary | ICD-10-CM

## 2021-11-01 DIAGNOSIS — R519 Headache, unspecified: Secondary | ICD-10-CM

## 2021-11-01 DIAGNOSIS — F0781 Postconcussional syndrome: Secondary | ICD-10-CM

## 2021-11-01 DIAGNOSIS — S134XXA Sprain of ligaments of cervical spine, initial encounter: Secondary | ICD-10-CM | POA: Diagnosis not present

## 2021-11-01 DIAGNOSIS — M5481 Occipital neuralgia: Secondary | ICD-10-CM | POA: Diagnosis not present

## 2021-11-01 DIAGNOSIS — M5412 Radiculopathy, cervical region: Secondary | ICD-10-CM

## 2021-11-01 DIAGNOSIS — G44309 Post-traumatic headache, unspecified, not intractable: Secondary | ICD-10-CM

## 2021-11-01 DIAGNOSIS — R269 Unspecified abnormalities of gait and mobility: Secondary | ICD-10-CM

## 2021-11-01 DIAGNOSIS — M542 Cervicalgia: Secondary | ICD-10-CM | POA: Diagnosis not present

## 2021-11-01 DIAGNOSIS — M4802 Spinal stenosis, cervical region: Secondary | ICD-10-CM

## 2021-11-01 MED ORDER — NURTEC 75 MG PO TBDP: 75.0000 mg | ORAL_TABLET | Freq: Every day | ORAL | 0 refills | Status: DC | PRN

## 2021-11-01 NOTE — Progress Notes (Unsigned)
[facility]    Provider:  Dr Jaynee Eagles Requesting Provider: Nicoletta Dress, MD Primary Care Provider:  Nicoletta Dress, MD  CC:  headaches  HPI:  Julia Logan is a 76 y.o. female here as requested by Nicoletta Dress, MD for chronic daily headaches.  She has a past medical history of cervical spondylosis, osteoarthritis of the shoulder, hypertensive kidney disease, CKD stage III, osteoporosis, GERD, obesity, magnesium metabolism disorder, non-ST elevation MI, degeneration of L4-L5 intervertebral discs, CAD status post percutaneous coronary angioplasty, type 2 diabetes, lumbar radiculopathy affecting the right leg, restless leg syndrome, major depressive disorder, iron deficiency anemia, chronic renal disease stage III, renal cell carcinoma.  She presented in July to [facility] and I reviewed Dr. Marval Regal notes, presented for headache, nausea and vomiting, the pain is sharp and throbbing, onset was gradual 3 weeks prior and she describes it as severe and worsening, associated symptoms did not include fever chills or scalp tenderness, she tried Tylenol, this was not her usual headache which is localized on the left side and throbbing, MRI of the brain was last done in July 2023 which showed no acute changes she was given Toradol and Zofran for nausea and vomiting, she continues to be very dizzy, having headache still, and stays nauseated all the time examination including physical and neurologic were unremarkable, she was prescribed Phenergan for her nausea.  I reviewed epic system and "Care Everywhere" system and I do not see any neurology notes or any prior history of her headaches.  Patient is here with her daughter who also provides information. 5-6 weeks ago it started hurting on the left over the eyes, mainly left eye, like a stabbing pain or a dull pain, tylenol didn't help, it may have started after a fall after a fall at the hospital and she fell and hit her head and had a  knot on her head, she has fell 3x in the last 5-6 weeks [place] and [place] and also hit forehead, and lso cracked a vertebrae and had a few falls in the house. One real bad head bump last 6 weeks and after that the headaches started. The MRI did not show anything acute, old small stroke and she is on asa 73m for stroke prevention and managing vascular risk factors with Dr. SDelena Bali Not nearly as bad, improving. The pattern is between the cervico-occipital junction and the temple. Slowly getting better, nausea is getting better, headache is getting better. She is having jaw pain, vision changes in the left eye gotten worse since the injury. She is at least 50% better since hitting her head so discussed starting steroids in case of temporal arteritis but given her diabetes and getting better and association with head trauma we decided to hold off, jasw hurts bilaterally. Headaches were up to 10, now 5-6 in pain, every day, she is very active.   Reviewed notes, labs and imaging from outside physicians, which showed:  I reviewed MRI of the brain images, multiple T2 flair hyperintense signal abnormalities nonspecific but likely mild chronic small vessel ischemic disease, there is a chronic lacunar infarct within the lateral right thalamus, no acute infarcts, no evidence of mass, intracranial blood products, fluid collections, midline shift impression, no evidence of acute intracranial abnormality, mild chronic small vessel ischemic changes within the cerebral white matter, chronic lacunar infarct within the right thalamus, moderate generalized cerebral atrophy with comparatively mild cerebellar atrophy.  January 17, 2021, BUN 29 and creatinine 1.79, I do  not see any more recent lab work  Review of Systems: Patient complains of symptoms per HPI as well as the following symptoms headaches, neck pain. Pertinent negatives and positives per HPI. All others negative.   Social History   Socioeconomic  History   Marital status: Married    Spouse name: Not on file   Number of children: 2   Years of education: college   Highest education level: Master's degree (e.g., MA, MS, MEng, MEd, MSW, MBA)  Occupational History   Occupation: retired Pharmacist, hospital  Tobacco Use   Smoking status: Never   Smokeless tobacco: Never  Vaping Use   Vaping Use: Never used  Substance and Sexual Activity   Alcohol use: No   Drug use: No   Sexual activity: Not on file  Other Topics Concern   Not on file  Social History Narrative   Lives at home with husband.   Right-handed.   No daily caffeine use.   Social Determinants of Health   Financial Resource Strain: Not on file  Food Insecurity: Not on file  Transportation Needs: Not on file  Physical Activity: Not on file  Stress: Not on file  Social Connections: Not on file  Intimate Partner Violence: Not on file    Family History  Problem Relation Age of Onset   Diabetes Maternal Grandfather    Heart attack Father    Diabetes Sister    Hypertension Sister    Diabetes Brother    Hypertension Brother     Past Medical History:  Diagnosis Date   Cervical spondylosis without myelopathy    Colon polyps    Diverticulitis of colon (without mention of hemorrhage)(562.11)    Esophageal reflux    Essential hypertension, benign    Heart disease    History of kidney cancer    Iron deficiency anemia, unspecified    Osteoarthrosis, unspecified whether generalized or localized, shoulder region    Osteoporosis    Other and unspecified hyperlipidemia    Restless legs syndrome (RLS)    Type II or unspecified type diabetes mellitus without mention of complication, not stated as uncontrolled     Patient Active Problem List   Diagnosis Date Noted   Cervical spondylosis without myelopathy 11/28/2020   Colon polyps 11/28/2020   Diverticulitis of colon (without mention of hemorrhage)(562.11) 11/28/2020   Iron deficiency anemia, unspecified 11/28/2020    Restless legs syndrome (RLS) 11/28/2020   CKD (chronic kidney disease) stage 3, GFR 30-59 ml/min ([place]) 10/27/2017   Malignant neoplasm of left kidney ([place]) 10/09/2017   Abnormal nuclear stress test 09/09/2017   Stable angina pectoris (HCC) 05/11/2017   Acid reflux disease 05/05/2017   Anemia 05/05/2017   Chronic pain 05/05/2017   Hard of hearing 05/05/2017   History of recurrent UTI (urinary tract infection) 05/05/2017   Hyperlipidemia 05/05/2017   Sciatica, right side 05/05/2017   Coronary artery disease involving native coronary artery of native heart with angina pectoris ([place]) 01/01/2017   Diabetes mellitus ([place]) 12/31/2016   Hypertension 12/31/2016   Atrophic vaginitis 08/14/2016   History of nephrectomy 03/19/2016   Cancer of kidney ([place]) 10/04/2015   Urinary tract infection 09/24/2015    Past Surgical History:  Procedure Laterality Date   APPENDECTOMY  03/18/1975   BUNIONECTOMY     CORONARY BALLOON ANGIOPLASTY N/A 11/09/2017   Procedure: CORONARY BALLOON ANGIOPLASTY;  Surgeon: Martinique, Peter M, MD;  Location: [facility];  Service: Cardiovascular;  Laterality: N/A;   CORONARY STENT INTERVENTION N/A 01/01/2017  Procedure: CORONARY STENT INTERVENTION;  Surgeon: Martinique, Peter M, MD;  Location: [facility];  Service: Cardiovascular;  Laterality: N/A;   KNEE SURGERY     LEFT HEART CATH AND CORONARY ANGIOGRAPHY N/A 01/01/2017   Procedure: LEFT HEART CATH AND CORONARY ANGIOGRAPHY;  Surgeon: Martinique, Peter M, MD;  Location: [facility];  Service: Cardiovascular;  Laterality: N/A;   LEFT HEART CATH AND CORONARY ANGIOGRAPHY N/A 11/09/2017   Procedure: LEFT HEART CATH AND CORONARY ANGIOGRAPHY;  Surgeon: Martinique, Peter M, MD;  Location: [place] [facility];  Service: Cardiovascular;  Laterality: N/A;   PARTIAL NEPHRECTOMY Left 2017   VAGINAL HYSTERECTOMY  03/18/1975    Current Outpatient Medications  Medication Sig Dispense Refill   aspirin 81 MG tablet Take 81 mg  by mouth daily.     atorvastatin (LIPITOR) 80 MG tablet TAKE 1 TABLET(80 MG) BY MOUTH DAILY 90 tablet 1   Calcium Carbonate-Vit D-Min (CALCIUM 1200) 1200-1000 MG-UNIT CHEW Chew 1 tablet by mouth 2 (two) times daily.      estradiol (ESTRACE) 0.1 MG/GM vaginal cream Place 1 Applicatorful vaginally 3 (three) times a week.     ezetimibe (ZETIA) 10 MG tablet Take 10 mg by mouth daily.   3   Ferrous Sulfate (IRON) 325 (65 Fe) MG TABS Take 325-650 mg by mouth every other day. Take 325 mg by mouth in the morning EVERY OTHER DAY     glimepiride (AMARYL) 4 MG tablet Take 4 mg by mouth daily with breakfast.   3   pantoprazole (PROTONIX) 40 MG tablet Take 40 mg by mouth daily.     Probiotic Product (PROBIOTIC PO) Take 1 capsule by mouth daily.     ranolazine (RANEXA) 1000 MG SR tablet TAKE 1 TABLET(1000 MG) BY MOUTH TWICE DAILY 180 tablet 1   Rimegepant Sulfate (NURTEC) 75 MG TBDP Take 75 mg by mouth daily as needed. For migraines. Take as close to onset of migraine as possible. One daily maximum. 6 tablet 0   telmisartan-hydrochlorothiazide (MICARDIS HCT) 40-12.5 MG tablet Take 1 tablet by mouth daily. 30 tablet 3   metoprolol tartrate (LOPRESSOR) 25 MG tablet Take 0.5 tablets (12.5 mg total) by mouth 2 (two) times daily. 90 tablet 3   No current facility-administered medications for this visit.    Allergies as of 11/01/2021 - Review Complete 11/01/2021  Allergen Reaction Noted   Gabapentin Swelling and Other (See Comments) 09/07/2015   Ticagrelor Shortness Of Breath 04/18/2016   Codeine Other (See Comments) 08/07/2010    Vitals: BP 126/62   Pulse 66   Ht '5\' 1"'  (1.549 m)   Wt 155 lb 6.4 oz (70.5 kg)   BMI 29.36 kg/m  Last Weight:  Wt Readings from Last 1 Encounters:  11/01/21 155 lb 6.4 oz (70.5 kg)   Last Height:   Ht Readings from Last 1 Encounters:  11/01/21 '5\' 1"'  (1.549 m)     Physical exam: Exam: Gen: NAD, conversant, well nourised, overweight, well groomed                      CV: RRR, no MRG. No Carotid Bruits. No peripheral edema, warm, nontender Eyes: Conjunctivae clear without exudates or hemorrhage  Neuro: Detailed Neurologic Exam  Speech:    Speech is normal; fluent and spontaneous with normal comprehension.  Cognition:    The patient is oriented to person, place, and time;     recent and remote memory intact;     language  fluent;     normal attention, concentration,     fund of knowledge Cranial Nerves:    The pupils are equal, round, and reactive to light.  Attempted, pupils too small to visualize fundus.  Visual fields are full to finger confrontation. Extraocular movements are intact. Trigeminal sensation is intact and the muscles of mastication are normal. The face is symmetric. The palate elevates in the midline. Hearing intact. Voice is normal. Shoulder shrug is normal. The tongue has normal motion without fasciculations.   Coordination:    Normal finger to nose and heel to shin.  Gait:  can get up independently, antalgic due to black pain and recent vertebral injury, short steps, better using her walker      Motor Observation:    No asymmetry, no atrophy, and no involuntary movements noted. Tone:    Normal muscle tone.    Posture:    Posture is slightly stooped    Strength:    Strength is V/V in the upper and lower limbs.      Sensation: intact to LT     Reflex Exam:  DTR's: 1+ lowers, brisk uppers symmetrical      Toes:    The toes are downgoing bilaterally.   Clonus:    Clonus is absent.    Assessment/Plan:   76 year old female with new onset left occipital neuralgia and chronic neck pain after a fall with concerning symptoms of prior vertebral fractures, cervical radic, falls, osteoporosis, shooting pain, cervicalgia, imbalance and falls needs MRI cervical spine. Pain is left-sided  and is located in the distribution of the greater, lesser and/or third occipital nerves, paroxysmal and brief, painful, sharp, with tenderness  and trigger points at the emergence of the greater occipital nerve and left temporal headache.  Extended visit, disccoeed occipital nerve, reviewed images online and etiologies.  - MRI of the cervical spine - Will need MRI of the cervical spine to evaluate for surgical or interventional options ([facility]I): - MRI of the cervical spine given neck pain, fall, osteoporosis, veterbral fractures and neck pain - [place]  - happened after a fall and head bump.  She had nausea, dizziness, headache, likely could have had post-concussion syndrome and whiplash and post-concussive headache. Also whiplash may have affected the occipital nerve as the distribution of the pain is in the occipital nerve distribution.   - She is at least 50% better since hitting her head so discussed starting steroids in case of temporal arteritis but given her diabetes and getting better and association with head trauma and bilateral quality we decided to hold off but she understands the risk of permanent vision loss in not taking steroids. Will check esr and crp although unlikely [facility]  - Physical therapy - - Physical therapy - [facility], include manual therapy, stretching, heating, dry needling, strength exercises or any modality per evaluation.Occipital neuralgia and left neck-sided pain. [place].Order.   Injections - Consider occipital nerve blocks,  Epidural Steroid INjections or medial branch blocks for neck pain and occipital neuralgia in the future as needed; - Injections - Consider "occipital nerve blocks" back with me in the office  - she is improving, another option is doing nothing except imaging of the neck imaging  - Medications may cause increased falls, so hate to try muscle relaxer or gabapentin. Try 4% lidocaine OTC 4% lidocaine patch on the neck and samples of nurtec . Samples of nurtec daily as needed to help bridge with the headache and if it  helps we can always prescribe.  - Will can call after the mri  cervical spine for next steps and can let me know if the nurtec helps or if the patch really help with the headache/crevicalgia  - May take 3-6 months to get over post-concussive syndrome and possibly whiplash of neck that affected the occipital nerve causing the neck pain and headaches. Also post-concussive headache is very common.   Orders Placed This Encounter  Procedures   MR CERVICAL SPINE WO CONTRAST   Sedimentation rate   C-reactive protein   Basic Metabolic Panel   Ambulatory referral to Physical Therapy   Meds ordered this encounter  Medications   Rimegepant Sulfate (NURTEC) 75 MG TBDP    Sig: Take 75 mg by mouth daily as needed. For migraines. Take as close to onset of migraine as possible. One daily maximum.    Dispense:  6 tablet    Refill:  0    Cc: Nicoletta Dress, MD,  Nicoletta Dress, MD  Sarina Ill, MD  [facility] [address] [place], [place] [address]  Phone [phone] Fax [phone]  I spent over 90 minutes of face-to-face and non-face-to-face time with patient on the  1. Post concussive syndrome   2. Post-concussion headache   3. Occipital neuralgia of left side   4. Left temporal headache   5. Whiplash injury to neck, initial encounter   6. Cervico-occipital neuralgia   7. Pain of neck with recent traumatic injury   8. Fall, initial encounter   9. Imbalance   10. Gait abnormality   11. Cervical radiculopathy   12. Degenerative cervical spinal stenosis   13. Osteoporosis with current pathological fracture, unspecified osteoporosis type, initial encounter    diagnosis.  This included previsit chart review, lab review, study review, order entry, electronic health record documentation, patient education on the different diagnostic and therapeutic options, counseling and coordination of care, risks and benefits of management, compliance, or risk factor reduction

## 2021-11-01 NOTE — Patient Instructions (Addendum)
- May take 3-6 months to get over post-concussive syndrome and possibly whiplash of neck that affected the occipital nerve causing the neck pain and headaches. Also post-concussive headache is very common.  - She is at least 50% better since hitting her head so discussed starting steroids in case of temporal arteritis but given her diabetes and getting better and association with head trauma and bilateral quality less likely, we decided to hold off but she understands the risk of permanent vision loss in not taking steroids. Will check esr and crp. Will order labs today  - Physical therapy - [facility], include manual therapy, stretching, heating, dry needling, strength exercises or any modality per evaluation.Occipital neuralgia and left neck-sided pain. [place].Order.   - MRI of the cervical spine given neck pain, fall, osteoporosis, veterbral fractures and neck pain - [facility]  - Injections - Consider "occipital nerve blocks" back with me in the office  - she is improving, at least 50%,  another option is doing nothing except imaging of the neck   - Medications may cause increased falls, so hate to try muscle relaxer or gabapentin. Try 4% lidocaine OTC 4% lidocaine patch on the neck and samples of nurtec . Samples of nurtec daily as needed to help bridge with the headache and if it helps we can always prescribe.  - Will can call after the mri cervical spine for next steps and can let me know if the nurtec helps or if the patch really help with the headache/crevicalgia  Rimegepant Disintegrating Tablets What is this medication? RIMEGEPANT (ri ME je pant) prevents and treats migraines. It works by blocking a substance in the body that causes migraines. This medicine may be used for other purposes; ask your health care provider or pharmacist if you have questions. COMMON BRAND NAME(S): NURTEC ODT What should I tell my care team before I take this medication? They need to know if you  have any of these conditions: Kidney disease Liver disease An unusual or allergic reaction to rimegepant, other medications, foods, dyes, or preservatives Pregnant or trying to get pregnant Breast-feeding How should I use this medication? Take this medication by mouth. Take it as directed on the prescription label. Leave the tablet in the sealed pack until you are ready to take it. With dry hands, open the pack and gently remove the tablet. If the tablet breaks or crumbles, throw it away. Use a new tablet. Place the tablet in the mouth and allow it to dissolve. Then, swallow it. Do not cut, crush, or chew this medication. You do not need water to take this medication. Talk to your care team about the use of this medication in children. Special care may be needed. Overdosage: If you think you have taken too much of this medicine contact a poison control center or emergency room at once. NOTE: This medicine is only for you. Do not share this medicine with others. What if I miss a dose? This does not apply. This medication is not for regular use. What may interact with this medication? Certain medications for fungal infections, such as fluconazole, itraconazole Rifampin This list may not describe all possible interactions. Give your health care provider a list of all the medicines, herbs, non-prescription drugs, or dietary supplements you use. Also tell them if you smoke, drink alcohol, or use illegal drugs. Some items may interact with your medicine. What should I watch for while using this medication? Visit your care team for regular checks on  your progress. Tell your care team if your symptoms do not start to get better or if they get worse. What side effects may I notice from receiving this medication? Side effects that you should report to your care team as soon as possible: Allergic reactions--skin rash, itching, hives, swelling of the face, lips, tongue, or throat Side effects that usually  do not require medical attention (report to your care team if they continue or are bothersome): Nausea Stomach pain This list may not describe all possible side effects. Call your doctor for medical advice about side effects. You may report side effects to FDA at 1-800-FDA-1088. Where should I keep my medication? Keep out of the reach of children and pets. Store at room temperature between 20 and 25 degrees C (68 and 77 degrees F). Get rid of any unused medication after the expiration date. To get rid of medications that are no longer needed or have expired: Take the medication to a medication take-back program. Check with your pharmacy or law enforcement to find a location. If you cannot return the medication, check the label or package insert to see if the medication should be thrown out in the garbage or flushed down the toilet. If you are not sure, ask your care team. If it is safe to put it in the trash, take the medication out of the container. Mix the medication with cat litter, dirt, coffee grounds, or other unwanted substance. Seal the mixture in a bag or container. Put it in the trash. NOTE: This sheet is a summary. It may not cover all possible information. If you have questions about this medicine, talk to your doctor, pharmacist, or health care provider.  2023 Elsevier/Gold Standard (2021-04-24 00:00:00)   Occipital Nerve Block Patient Information  Description: The occipital nerves originate in the cervical (neck) spinal cord and travel upward through muscle and tissue to supply sensation to the back of the head and top of the scalp.  In addition, the nerves control some of the muscles of the scalp.  Occipital neuralgia is an irritation of these nerves which can cause headaches, numbness of the scalp, and neck discomfort.     The occipital nerve block will interrupt nerve transmission through these nerves and can relieve pain and spasm.  The block consists of insertion of a small  needle under the skin in the back of the head to deposit local anesthetic (numbing medicine) and/or steroids around the nerve.  The entire block usually lasts less than 5 minutes.  Conditions which may be treated by occipital blocks:  Muscular pain and spasm of the scalp Nerve irritation, back of the head Headaches Upper neck pain  Possible side-effects:  Bleeding from needle site Infection (rare, may require surgery) Nerve injury (rare) Hair on back of neck can be tinged with iodine scrub (this will wash out) Light-headedness (temporary) Pain at injection site (several days) Decreased blood pressure (rare, temporary) Seizure (very rare)  Call if you experience:  Hives or difficulty breathing ( go to the emergency room) Inflammation or drainage at the injection site(s)  Please note:  Although the local anesthetic injected can often make your painful muscles or headache feel good for several hours after the injection, the pain may return.  It takes 3-7 days for steroids to work.  You may not notice any pain relief for at least one week.  If effective, we will often do a series of injections spaced 3-6 weeks apart to maximally decrease your pain.  Rimegepant Disintegrating Tablets What is this medication? RIMEGEPANT (ri ME je pant) prevents and treats migraines. It works by blocking a substance in the body that causes migraines. This medicine may be used for other purposes; ask your health care provider or pharmacist if you have questions. COMMON BRAND NAME(S): NURTEC ODT What should I tell my care team before I take this medication? They need to know if you have any of these conditions: Kidney disease Liver disease An unusual or allergic reaction to rimegepant, other medications, foods, dyes, or preservatives Pregnant or trying to get pregnant Breast-feeding How should I use this medication? Take this medication by mouth. Take it as directed on the prescription label. Leave  the tablet in the sealed pack until you are ready to take it. With dry hands, open the pack and gently remove the tablet. If the tablet breaks or crumbles, throw it away. Use a new tablet. Place the tablet in the mouth and allow it to dissolve. Then, swallow it. Do not cut, crush, or chew this medication. You do not need water to take this medication. Talk to your care team about the use of this medication in children. Special care may be needed. Overdosage: If you think you have taken too much of this medicine contact a poison control center or emergency room at once. NOTE: This medicine is only for you. Do not share this medicine with others. What if I miss a dose? This does not apply. This medication is not for regular use. What may interact with this medication? Certain medications for fungal infections, such as fluconazole, itraconazole Rifampin This list may not describe all possible interactions. Give your health care provider a list of all the medicines, herbs, non-prescription drugs, or dietary supplements you use. Also tell them if you smoke, drink alcohol, or use illegal drugs. Some items may interact with your medicine. What should I watch for while using this medication? Visit your care team for regular checks on your progress. Tell your care team if your symptoms do not start to get better or if they get worse. What side effects may I notice from receiving this medication? Side effects that you should report to your care team as soon as possible: Allergic reactions--skin rash, itching, hives, swelling of the face, lips, tongue, or throat Side effects that usually do not require medical attention (report to your care team if they continue or are bothersome): Nausea Stomach pain This list may not describe all possible side effects. Call your doctor for medical advice about side effects. You may report side effects to FDA at 1-800-FDA-1088. Where should I keep my medication? Keep out  of the reach of children and pets. Store at room temperature between 20 and 25 degrees C (68 and 77 degrees F). Get rid of any unused medication after the expiration date. To get rid of medications that are no longer needed or have expired: Take the medication to a medication take-back program. Check with your pharmacy or law enforcement to find a location. If you cannot return the medication, check the label or package insert to see if the medication should be thrown out in the garbage or flushed down the toilet. If you are not sure, ask your care team. If it is safe to put it in the trash, take the medication out of the container. Mix the medication with cat litter, dirt, coffee grounds, or other unwanted substance. Seal the mixture in a bag or container. Put it in the trash. NOTE:  This sheet is a summary. It may not cover all possible information. If you have questions about this medicine, talk to your doctor, pharmacist, or health care provider.  2023 Elsevier/Gold Standard (2021-04-24 00:00:00)   Temporal Arteritis  Temporal arteritis is a condition that causes arteries to become inflamed. It usually affects arteries in your head and face, but arteries in any part of the body can become inflamed. The condition is also called giant cell arteritis.  Temporal arteritis can cause serious problems such as blindness. Early treatment can help prevent these problems. What are the causes? The cause of this condition is not known. What increases the risk? The following factors may make you more likely to develop this condition: Being older than 50. Being a woman. Being Caucasian. Being of [place], [place], [place], [place] or [place] ancestry. Having a family history of the condition. Having a certain condition that causes muscle pain and stiffness (polymyalgia rheumatica, PMR). What are the signs or symptoms? Some people with temporal arteritis have just one symptom, while others have  several symptoms. Most symptoms are related to the head and face. These may include: Headache. Hard, swollen, or tender temples. This is common. Your temples are the areas on either side of your forehead. Pain when combing your hair or when laying your head down. Pain in the jaw when chewing. Pain in the throat or tongue. Problems with your vision, such as sudden loss of vision in one eye, or seeing double. Other symptoms may include: Fever. Tiredness (fatigue). A dry cough. Pain in the hips and shoulders. Pain in the arms during exercise. Depression. Weight loss. How is this diagnosed? This condition may be diagnosed based on: Your symptoms. Your medical history. A physical exam. Tests, including: Blood tests. A test in which a tissue sample is removed from an artery so it can be examined (biopsy). Imaging tests, such as an ultrasound or MRI. How is this treated? This condition may be treated with: A type of medicine to reduce inflammation (corticosteroid). Medicines to weaken your immune system (immunosuppressants). Other medicines to treat vision problems. You will need to see your health care provider while you are being treated. The medicines used to treat this condition can increase your risk of problems such as bone loss and diabetes. During follow-up visits, your health care provider will check for problems by: Doing blood tests and bone density tests. Checking your blood pressure and blood sugar. Follow these instructions at home: Medicines Take over-the-counter and prescription medicines only as told by your health care provider. Take any vitamins or supplements recommended by your health care provider. These may include vitamin D and calcium, which help keep your bones from becoming weak. Eating and drinking  Eat a heart-healthy diet. This may include: Eating high-fiber foods, such as fresh fruits and vegetables, whole grains, and beans. Eating heart-healthy fats  (omega-3 fats), such as fish, flaxseed, and flaxseed oil. Limiting foods that are high in saturated fat and cholesterol, such as processed and fried foods, fatty meat, and full-fat dairy. Limiting how much salt (sodium) you eat. Include calcium and vitamin D in your diet. Good sources of calcium and vitamin D include: Low-fat dairy products such as milk, yogurt, and cheese. Certain fish, such as fresh or canned salmon, tuna, and sardines. Products that have calcium and vitamin D added to them (fortified products), such as fortified cereals or juice. General instructions Exercise. Talk with your health care provider about what exercises are okay for you. Exercises that increase  your heart rate (aerobic exercise), such as walking, are often recommended. Aerobic exercise helps control your blood pressure and prevent bone loss. Stay up to date on all vaccines as directed by your health care provider. Keep all follow-up visits as told by your health care provider. This is important. Contact a health care provider if: Your symptoms get worse. You develop signs of infection, such as fever, swelling, redness, warmth, and tenderness. Get help right away if: You lose your vision. Your pain does not go away, even after you take medicine. You have chest pain. You have trouble breathing. One side of your face or body suddenly becomes weak or numb. These symptoms may represent a serious problem that is an emergency. Do not wait to see if the symptoms will go away. Get medical help right away. Call your local emergency services (911 in the [place] Do not drive yourself to the hospital. Summary Temporal arteritis is a condition that causes arteries to become inflamed. It usually affects arteries in your head and face. This condition can cause serious problems, such as blindness. Treatment can help prevent these problems. Symptoms may include hard or tender temples, pain in your jaw when chewing, problems with  your vision, or pain in your hips and shoulders. Take over-the-counter and prescription medicines as told by your health care provider. This information is not intended to replace advice given to you by your health care provider. Make sure you discuss any questions you have with your health care provider. Document Revised: 05/15/2020 Document Reviewed: 05/15/2020 Elsevier Patient Education  [place].    Occipital Neuralgia      Occipital neuralgia is a type of headache that causes brief episodes of very bad pain in the back of the head. Pain from occipital neuralgia may spread (radiate) to other parts of the head. These headaches may be caused by irritation of the nerves that leave the spinal cord high up in the neck, just below the base of the skull (occipital nerves). The occipital nerves transmit sensations from the back of the head, the top of the head, and the areas behind the ears. What are the causes? This condition can occur without any known cause (primary headache syndrome). In other cases, this condition is caused by pressure on or irritation of one of the two occipital nerves. Pressure and irritation may be due to: Muscle spasm in the neck. Neck injury. Wear and tear of the vertebrae in the neck (osteoarthritis). Disease of the disks that separate the vertebrae. Swollen blood vessels that put pressure on the occipital nerves. Infections. Tumors. Diabetes. What are the signs or symptoms? This condition causes brief burning, stabbing, electric, shocking, or shooting pain in the back of the head that can radiate to the top of the head. It can happen on one side or both sides of the head. It can also cause: Pain behind the eye. Pain triggered by neck movement or hair brushing. Scalp tenderness. Aching in the back of the head between episodes of very bad pain. Pain that gets worse with exposure to bright lights. How is this diagnosed? Your health care provider may  diagnose the condition based on a physical exam and your symptoms. Tests may be done, such as: Imaging studies of the brain and neck (cervical spine), such as an MRI or CT scan. These look for causes of pinched nerves. Applying pressure to the nerves in the neck to try to re-create the pain. Injection of numbing medicine into the occipital  nerve areas to see if pain goes away (diagnostic nerve block). How is this treated? Treatment for this condition may begin with simple measures, such as: Rest. Massage. Applying heat or cold to the area. Over-the-counter pain relievers. If these measures do not work, you may need other treatments, including: Medicines, such as: Prescription-strength anti-inflammatory medicines. Muscle relaxants. Anti-seizure medicines, which can relieve pain. Antidepressants, which can relieve pain. Injected medicines, such as medicines that numb the area (local anesthetic) and steroids. Pulsed radiofrequency ablation. This is when wires are implanted to deliver electrical impulses that block pain signals from the occipital nerve. Surgery to relieve nerve pressure. Physical therapy. Follow these instructions at home: Managing pain     Avoid any activities that cause pain. Rest when you have an attack of pain. Try gentle massage to relieve pain. Try a different pillow or sleeping position. If directed, apply heat to the affected area as often as told by your health care provider. Use the heat source that your health care provider recommends, such as a moist heat pack or a heating pad. Place a towel between your skin and the heat source. Leave the heat on for 20-30 minutes. Remove the heat if your skin turns bright red. This is especially important if you are unable to feel pain, heat, or cold. You have a greater risk of getting burned. If directed, put ice on the back of your head and neck area. To do this: Put ice in a plastic bag. Place a towel between your skin  and the bag. Leave the ice on for 20 minutes, 2-3 times a day. Remove the ice if your skin turns bright red. This is very important. If you cannot feel pain, heat, or cold, you have a greater risk of damage to the area. General instructions Take over-the-counter and prescription medicines only as told by your health care provider. Avoid things that make your symptoms worse, such as bright lights. Try to stay active. Get regular exercise that does not cause pain. Ask your health care provider to suggest safe exercises for you. Work with a physical therapist to learn stretching exercises you can do at home. Practice good posture. Keep all follow-up visits. This is important. Contact a health care provider if: Your medicine is not working. You have new or worsening symptoms. Get help right away if: You have very bad head pain that does not go away. You have a sudden change in vision, balance, or speech. These symptoms may represent a serious problem that is an emergency. Do not wait to see if the symptoms will go away. Get medical help right away. Call your local emergency services (911 in the [place] Do not drive yourself to the hospital. Summary Occipital neuralgia is a type of headache that causes brief episodes of very bad pain in the back of the head. Pain from occipital neuralgia may spread (radiate) to other parts of the head. Treatment for this condition includes rest, massage, and medicines. This information is not intended to replace advice given to you by your health care provider. Make sure you discuss any questions you have with your health care provider. Document Revised: 01/01/2020 Document Reviewed: 01/01/2020 Elsevier Patient Education  [place]   Post-Concussion Syndrome  A concussion is a brain injury. Post-concussion syndrome is when symptoms last longer than normal after a head injury. What are the causes? The cause of this condition is not known. It can  happen if your head injury was mild or very  bad. What increases the risk? Being female. Being young. Having had a head injury before. Being sad (depressed) or feeling worried or nervous (having anxiety). Fainting when you got your concussion or not being able to remember it. Having many symptoms or very bad symptoms at the time of your injury. What are the signs or symptoms? After a head injury, you may have physical symptoms, such as: Having headaches. Feeling tired. Feeling dizzy. Nausea Feeling weak. Having trouble seeing. Having trouble in bright lights. Having trouble hearing. Having problems balancing. You may also have mental and emotional symptoms, such as: Not being able to remember things. Not being able to focus. Having trouble sleeping. Feeling grouchy (irritable). Feeling worried. Feeling sad. Having trouble learning new things. These can last from weeks to months. How is this treated? Treatment for this condition may depend on your symptoms. Symptoms usually go away on their own over time. If you need treatment, it may include: Taking medicines. Resting your brain and body for a few days. Doing therapy to help you recover (rehabilitation therapy). This may include: Physical or occupational therapy. This may include exercises. Talking to a counselor. Speech therapy. Therapy to help your eyes. Follow these instructions at home: Medicines Take all medicines only as told by your doctor. Avoid pain medicines that have opioids in them. Ask your doctor which pain medicine to take. Activity Limit activities as told by your doctor. This may include not doing the following: Homework. Job-related work. Hard thinking. Watching TV. Using a computer or phone. Puzzles. Exercise. Sports. Slowly return to your normal activity as told by your doctor. Stop an activity if you have symptoms. Rest. Try to: Sleep 7-9 hours each night. Take naps or breaks when you feel  tired during the day. Do not do anything that may cause you to get hurt again right away. General instructions  Do not drink alcohol until your doctor says that you can. Keep track of your symptoms. Keep all follow-up visits as told by your doctor. This is important. Contact a doctor if: You do not improve. You get worse. You have another injury. Get help right away if: You have a very bad headache or a headache that gets worse. You feel confused. You feel very sleepy. You faint. You vomit. You feel weak in any part of your body. You feel numb in any part of your body. You start shaking (have a seizure). You have trouble talking. Summary This condition is when symptoms last longer than normal after a head injury. Limit your activities after your injury. Slowly return to normal activities as told by your doctor. Rest. Do not drink alcohol, and avoid pain medicines that have opioids in them. Call your doctor if your symptoms get worse. This information is not intended to replace advice given to you by your health care provider. Make sure you discuss any questions you have with your health care provider. Document Revised: 05/17/2020 Document Reviewed: 05/17/2020 Elsevier Patient Education  [place].

## 2021-11-02 LAB — BASIC METABOLIC PANEL
BUN/Creatinine Ratio: 17 (ref 12–28)
BUN: 36 mg/dL — ABNORMAL HIGH (ref 8–27)
CO2: 23 mmol/L (ref 20–29)
Calcium: 10.4 mg/dL — ABNORMAL HIGH (ref 8.7–10.3)
Chloride: 97 mmol/L (ref 96–106)
Creatinine, Ser: 2.16 mg/dL — ABNORMAL HIGH (ref 0.57–1.00)
Glucose: 267 mg/dL — ABNORMAL HIGH (ref 70–99)
Potassium: 4.2 mmol/L (ref 3.5–5.2)
Sodium: 135 mmol/L (ref 134–144)
eGFR: 23 mL/min/{1.73_m2} — ABNORMAL LOW (ref >59)

## 2021-11-02 LAB — SEDIMENTATION RATE: Sed Rate: 5 mm/hr (ref 0–40)

## 2021-11-02 LAB — C-REACTIVE PROTEIN: CRP: 8 mg/L (ref 0–10)

## 2021-11-03 ENCOUNTER — Encounter: Payer: Self-pay | Admitting: Neurology

## 2021-11-04 ENCOUNTER — Telehealth: Payer: Self-pay | Admitting: Neurology

## 2021-11-04 NOTE — Telephone Encounter (Signed)
HTA no Sonny Masters auth: [id number] (exp. 11/04/21 to 12/03/21) order faxed to [facility], they will reach out to the pt to schedule

## 2021-11-04 NOTE — Telephone Encounter (Signed)
Sent to [facility]. Ph # [phone]

## 2021-11-06 DIAGNOSIS — R0609 Other forms of dyspnea: Secondary | ICD-10-CM | POA: Diagnosis not present

## 2021-11-06 DIAGNOSIS — M79601 Pain in right arm: Secondary | ICD-10-CM | POA: Diagnosis not present

## 2021-11-06 DIAGNOSIS — E860 Dehydration: Secondary | ICD-10-CM | POA: Diagnosis not present

## 2021-11-06 DIAGNOSIS — Z955 Presence of coronary angioplasty implant and graft: Secondary | ICD-10-CM | POA: Diagnosis not present

## 2021-11-06 DIAGNOSIS — R0789 Other chest pain: Secondary | ICD-10-CM | POA: Diagnosis not present

## 2021-11-06 DIAGNOSIS — M7989 Other specified soft tissue disorders: Secondary | ICD-10-CM | POA: Diagnosis not present

## 2021-11-06 DIAGNOSIS — R0781 Pleurodynia: Secondary | ICD-10-CM | POA: Diagnosis not present

## 2021-11-06 DIAGNOSIS — Z79899 Other long term (current) drug therapy: Secondary | ICD-10-CM | POA: Diagnosis not present

## 2021-11-06 DIAGNOSIS — R0602 Shortness of breath: Secondary | ICD-10-CM | POA: Diagnosis not present

## 2021-11-06 DIAGNOSIS — N179 Acute kidney failure, unspecified: Secondary | ICD-10-CM | POA: Diagnosis not present

## 2021-11-06 DIAGNOSIS — R112 Nausea with vomiting, unspecified: Secondary | ICD-10-CM | POA: Diagnosis not present

## 2021-11-06 DIAGNOSIS — R9431 Abnormal electrocardiogram [ECG] [EKG]: Secondary | ICD-10-CM | POA: Diagnosis not present

## 2021-11-06 DIAGNOSIS — I2510 Atherosclerotic heart disease of native coronary artery without angina pectoris: Secondary | ICD-10-CM | POA: Diagnosis not present

## 2021-11-06 DIAGNOSIS — E785 Hyperlipidemia, unspecified: Secondary | ICD-10-CM | POA: Diagnosis not present

## 2021-11-06 DIAGNOSIS — R079 Chest pain, unspecified: Secondary | ICD-10-CM | POA: Diagnosis not present

## 2021-11-06 DIAGNOSIS — N183 Chronic kidney disease, stage 3 unspecified: Secondary | ICD-10-CM | POA: Diagnosis not present

## 2021-11-06 DIAGNOSIS — N390 Urinary tract infection, site not specified: Secondary | ICD-10-CM | POA: Diagnosis not present

## 2021-11-06 DIAGNOSIS — R1310 Dysphagia, unspecified: Secondary | ICD-10-CM | POA: Diagnosis not present

## 2021-11-06 DIAGNOSIS — R7989 Other specified abnormal findings of blood chemistry: Secondary | ICD-10-CM | POA: Diagnosis not present

## 2021-11-06 DIAGNOSIS — M545 Low back pain, unspecified: Secondary | ICD-10-CM | POA: Diagnosis not present

## 2021-11-06 DIAGNOSIS — E1122 Type 2 diabetes mellitus with diabetic chronic kidney disease: Secondary | ICD-10-CM | POA: Diagnosis not present

## 2021-11-07 DIAGNOSIS — R079 Chest pain, unspecified: Secondary | ICD-10-CM | POA: Diagnosis not present

## 2021-11-07 DIAGNOSIS — R0609 Other forms of dyspnea: Secondary | ICD-10-CM | POA: Diagnosis not present

## 2021-11-07 DIAGNOSIS — M79601 Pain in right arm: Secondary | ICD-10-CM | POA: Diagnosis not present

## 2021-11-07 DIAGNOSIS — R0602 Shortness of breath: Secondary | ICD-10-CM | POA: Diagnosis not present

## 2021-11-08 ENCOUNTER — Emergency Department (HOSPITAL_COMMUNITY): Payer: PPO

## 2021-11-08 ENCOUNTER — Emergency Department (HOSPITAL_COMMUNITY)
Admission: EM | Admit: 2021-11-08 | Discharge: 2021-11-09 | Disposition: A | Discharge status: Home / Self Care | Payer: PPO | Attending: Emergency Medicine | Admitting: Emergency Medicine

## 2021-11-08 ENCOUNTER — Other Ambulatory Visit: Payer: Self-pay

## 2021-11-08 DIAGNOSIS — S0003XA Contusion of scalp, initial encounter: Secondary | ICD-10-CM | POA: Diagnosis not present

## 2021-11-08 DIAGNOSIS — R58 Hemorrhage, not elsewhere classified: Secondary | ICD-10-CM | POA: Diagnosis not present

## 2021-11-08 DIAGNOSIS — I10 Essential (primary) hypertension: Secondary | ICD-10-CM | POA: Diagnosis not present

## 2021-11-08 DIAGNOSIS — Z7982 Long term (current) use of aspirin: Secondary | ICD-10-CM | POA: Diagnosis not present

## 2021-11-08 DIAGNOSIS — Y92009 Unspecified place in unspecified non-institutional (private) residence as the place of occurrence of the external cause: Secondary | ICD-10-CM | POA: Insufficient documentation

## 2021-11-08 DIAGNOSIS — W19XXXA Unspecified fall, initial encounter: Secondary | ICD-10-CM

## 2021-11-08 DIAGNOSIS — I2510 Atherosclerotic heart disease of native coronary artery without angina pectoris: Secondary | ICD-10-CM | POA: Diagnosis not present

## 2021-11-08 DIAGNOSIS — S199XXA Unspecified injury of neck, initial encounter: Secondary | ICD-10-CM | POA: Diagnosis not present

## 2021-11-08 DIAGNOSIS — E1122 Type 2 diabetes mellitus with diabetic chronic kidney disease: Secondary | ICD-10-CM | POA: Insufficient documentation

## 2021-11-08 DIAGNOSIS — Z23 Encounter for immunization: Secondary | ICD-10-CM | POA: Diagnosis not present

## 2021-11-08 DIAGNOSIS — M542 Cervicalgia: Secondary | ICD-10-CM | POA: Insufficient documentation

## 2021-11-08 DIAGNOSIS — M25512 Pain in left shoulder: Secondary | ICD-10-CM | POA: Diagnosis not present

## 2021-11-08 DIAGNOSIS — I609 Nontraumatic subarachnoid hemorrhage, unspecified: Secondary | ICD-10-CM

## 2021-11-08 DIAGNOSIS — N183 Chronic kidney disease, stage 3 unspecified: Secondary | ICD-10-CM | POA: Insufficient documentation

## 2021-11-08 DIAGNOSIS — S4992XA Unspecified injury of left shoulder and upper arm, initial encounter: Secondary | ICD-10-CM | POA: Diagnosis not present

## 2021-11-08 DIAGNOSIS — R079 Chest pain, unspecified: Secondary | ICD-10-CM | POA: Diagnosis not present

## 2021-11-08 DIAGNOSIS — Y9301 Activity, walking, marching and hiking: Secondary | ICD-10-CM | POA: Insufficient documentation

## 2021-11-08 DIAGNOSIS — R0789 Other chest pain: Secondary | ICD-10-CM | POA: Insufficient documentation

## 2021-11-08 DIAGNOSIS — Z7984 Long term (current) use of oral hypoglycemic drugs: Secondary | ICD-10-CM | POA: Diagnosis not present

## 2021-11-08 DIAGNOSIS — R531 Weakness: Secondary | ICD-10-CM | POA: Diagnosis not present

## 2021-11-08 DIAGNOSIS — S0101XA Laceration without foreign body of scalp, initial encounter: Secondary | ICD-10-CM | POA: Insufficient documentation

## 2021-11-08 DIAGNOSIS — S066X0A Traumatic subarachnoid hemorrhage without loss of consciousness, initial encounter: Secondary | ICD-10-CM | POA: Diagnosis not present

## 2021-11-08 DIAGNOSIS — W109XXA Fall (on) (from) unspecified stairs and steps, initial encounter: Secondary | ICD-10-CM | POA: Insufficient documentation

## 2021-11-08 DIAGNOSIS — S0990XA Unspecified injury of head, initial encounter: Secondary | ICD-10-CM | POA: Diagnosis not present

## 2021-11-08 DIAGNOSIS — M79601 Pain in right arm: Secondary | ICD-10-CM | POA: Diagnosis not present

## 2021-11-08 DIAGNOSIS — R0609 Other forms of dyspnea: Secondary | ICD-10-CM | POA: Diagnosis not present

## 2021-11-08 LAB — CBC WITH DIFFERENTIAL/PLATELET
Abs Immature Granulocytes: 0.08 10*3/uL — ABNORMAL HIGH (ref 0.00–0.07)
Basophils Absolute: 0 10*3/uL (ref 0.0–0.1)
Basophils Relative: 0 %
Eosinophils Absolute: 0 10*3/uL (ref 0.0–0.5)
Eosinophils Relative: 0 %
HCT: 34.5 % — ABNORMAL LOW (ref 36.0–46.0)
Hemoglobin: 10.9 g/dL — ABNORMAL LOW (ref 12.0–15.0)
Immature Granulocytes: 1 %
Lymphocytes Relative: 9 %
Lymphs Abs: 0.7 10*3/uL (ref 0.7–4.0)
MCH: 30.8 pg (ref 26.0–34.0)
MCHC: 31.6 g/dL (ref 30.0–36.0)
MCV: 97.5 fL (ref 80.0–100.0)
Monocytes Absolute: 0.3 10*3/uL (ref 0.1–1.0)
Monocytes Relative: 4 %
Neutro Abs: 6 10*3/uL (ref 1.7–7.7)
Neutrophils Relative %: 86 %
Platelets: 217 10*3/uL (ref 150–400)
RBC: 3.54 MIL/uL — ABNORMAL LOW (ref 3.87–5.11)
RDW: 12.3 % (ref 11.5–15.5)
WBC: 7 10*3/uL (ref 4.0–10.5)
nRBC: 0 % (ref 0.0–0.2)

## 2021-11-08 LAB — BASIC METABOLIC PANEL
Anion gap: 9 (ref 5–15)
BUN: 24 mg/dL — ABNORMAL HIGH (ref 8–23)
CO2: 20 mmol/L — ABNORMAL LOW (ref 22–32)
Calcium: 10.1 mg/dL (ref 8.9–10.3)
Chloride: 105 mmol/L (ref 98–111)
Creatinine, Ser: 1.61 mg/dL — ABNORMAL HIGH (ref 0.44–1.00)
GFR, Estimated: 33 mL/min — ABNORMAL LOW (ref >60)
Glucose, Bld: 203 mg/dL — ABNORMAL HIGH (ref 70–99)
Potassium: 4 mmol/L (ref 3.5–5.1)
Sodium: 134 mmol/L — ABNORMAL LOW (ref 135–145)

## 2021-11-08 LAB — PROTIME-INR
INR: 1.1 (ref 0.8–1.2)
Prothrombin Time: 13.6 seconds (ref 11.4–15.2)

## 2021-11-08 MED ORDER — HYDRALAZINE HCL 20 MG/ML IJ SOLN: 5.0000 mg | Freq: Once | INTRAMUSCULAR | Status: DC

## 2021-11-08 MED ORDER — TETANUS-DIPHTH-ACELL PERTUSSIS 5-2.5-18.5 LF-MCG/0.5 IM SUSY
0.5000 mL | PREFILLED_SYRINGE | Freq: Once | INTRAMUSCULAR | Status: AC
  Administered 2021-11-08: 0.5 mL via INTRAMUSCULAR
  Filled 2021-11-08: qty 0.5

## 2021-11-08 MED ORDER — LIDOCAINE-EPINEPHRINE 1 %-1:100000 IJ SOLN
10.0000 mL | Freq: Once | INTRAMUSCULAR | Status: AC
  Administered 2021-11-08: 10 mL
  Filled 2021-11-08: qty 1

## 2021-11-08 MED ORDER — HYDROCODONE-ACETAMINOPHEN 5-325 MG PO TABS: 1.0000 | ORAL_TABLET | Freq: Once | ORAL | Status: DC

## 2021-11-08 MED ORDER — LABETALOL HCL 5 MG/ML IV SOLN: 5.0000 mg | Freq: Once | INTRAVENOUS | Status: DC

## 2021-11-08 NOTE — ED Provider Notes (Signed)
[facility] EMERGENCY DEPARTMENT Provider Note   CSN: [id number] Arrival date & time: 11/08/21  1749     History  No chief complaint on file.   Julia Logan is a 76 y.o. female.  HPI   76 year old female presents emergency department with complaints of head laceration.  Patient states that she was just recently discharged from [facility] for cardiac complaints.  She states she was walking back up her steps at home when she "missed stepped" walking up the steps into her house and fell backwards landing with the back of her head hitting the concrete.  She denies loss of consciousness.  She does state that she currently takes a daily aspirin but no other blood thinner.  She denies changes in vision, slurred speech, weakness/sensory deficit.  She has not attempted to ambulate since the incident as EMS was called right away.  She denies any syncopal/presyncopal type symptoms.  She denies feeling nauseous, dizzy, lightheaded, vomiting since the incident.  She notes associated left shoulder pain as well as neck pain.  Past medical history significant for cervical spondylosis without myelopathy, osteoporosis, diabetes mellitus, coronary artery disease, CKD stage III, hyperlipidemia  Home Medications Prior to Admission medications   Medication Sig Start Date End Date Taking? Authorizing Provider  aspirin 81 MG tablet Take 81 mg by mouth daily.    [provider]  atorvastatin (LIPITOR) 80 MG tablet TAKE 1 TABLET(80 MG) BY MOUTH DAILY 07/30/21   Richardo Priest, MD  Calcium Carbonate-Vit D-Min (CALCIUM 1200) 1200-1000 MG-UNIT CHEW Chew 1 tablet by mouth 2 (two) times daily.     [provider]  estradiol (ESTRACE) 0.1 MG/GM vaginal cream Place 1 Applicatorful vaginally 3 (three) times a week.    [provider]  ezetimibe (ZETIA) 10 MG tablet Take 10 mg by mouth daily.  05/04/17   [provider]  Ferrous Sulfate (IRON) 325 (65 Fe) MG  TABS Take 325-650 mg by mouth every other day. Take 325 mg by mouth in the morning EVERY OTHER DAY    [provider]  glimepiride (AMARYL) 4 MG tablet Take 4 mg by mouth daily with breakfast.  08/21/17   [provider]  metoprolol tartrate (LOPRESSOR) 25 MG tablet Take 0.5 tablets (12.5 mg total) by mouth 2 (two) times daily. 12/19/17 02/04/21  Strader, Fransisco Hertz, PA-C  pantoprazole (PROTONIX) 40 MG tablet Take 40 mg by mouth daily.    [provider]  Probiotic Product (PROBIOTIC PO) Take 1 capsule by mouth daily.    [provider]  ranolazine (RANEXA) 1000 MG SR tablet TAKE 1 TABLET(1000 MG) BY MOUTH TWICE DAILY 03/06/20   Richardo Priest, MD  Rimegepant Sulfate (NURTEC) 75 MG TBDP Take 75 mg by mouth daily as needed. For migraines. Take as close to onset of migraine as possible. One daily maximum. 11/01/21   Melvenia Beam, MD  telmisartan-hydrochlorothiazide (MICARDIS HCT) 40-12.5 MG tablet Take 1 tablet by mouth daily. 10/27/18   Richardo Priest, MD      Allergies    Gabapentin, Ticagrelor, and Codeine    Review of Systems   Review of Systems  All other systems reviewed and are negative.   Physical Exam Updated Vital Signs There were no vitals taken for this visit. Physical Exam Vitals and nursing note reviewed.  Constitutional:      General: She is not in acute distress.    Appearance: She is well-developed.  HENT:  Head: Normocephalic.     Comments: Patient has 3 cm laceration noted on her posterior scalp with disruption of the pericranium with noticeable exposed skull.  Wound seems clean in appearance with no obvious debris.  Wound is linear in appearance. Eyes:     Conjunctiva/sclera: Conjunctivae normal.  Neck:     Comments: Patient has c-collar in place but is complaining of right-sided neck pain.  C-collar was not removed as CT imaging of C-spine is pending at this time.  No obvious laceration/breaks in skin through window of  c-collar. Cardiovascular:     Rate and Rhythm: Normal rate and regular rhythm.     Heart sounds: No murmur heard. Pulmonary:     Effort: Pulmonary effort is normal. No respiratory distress.     Breath sounds: Normal breath sounds.     Comments: Patient has diffuse anterior chest wall tenderness. Abdominal:     Palpations: Abdomen is soft.     Tenderness: There is no abdominal tenderness.  Musculoskeletal:        General: No swelling.     Right lower leg: No edema.     Left lower leg: No edema.     Comments: No tenderness to palpation of midline thoracic, lumbar spine with no obvious step-off or deformity.  No tenderness to palpation along lower extremities.  Skin:    General: Skin is warm and dry.     Capillary Refill: Capillary refill takes less than 2 seconds.  Neurological:     Mental Status: She is alert and oriented to person, place, and time.     GCS: GCS eye subscore is 4. GCS verbal subscore is 5. GCS motor subscore is 6.     Cranial Nerves: No dysarthria or facial asymmetry.     Motor: No pronator drift.     Coordination: Finger-Nose-Finger Test normal.     Comments: Cranial nerves III through XII grossly intact.  Muscle strength 5 out of 5 for upper lower extremities.  No sensory deficits along major nerve distributions of upper or lower extremities.  Psychiatric:        Mood and Affect: Mood normal.    ED Results / Procedures / Treatments   Labs (all labs ordered are listed, but only abnormal results are displayed) Labs Reviewed - No data to display  EKG None  Radiology No results found.  Procedures .Marland KitchenLaceration Repair  Date/Time: 11/08/2021 8:56 PM  Performed by: Wilnette Kales, PA Authorized by: Wilnette Kales, PA   Consent:    Consent obtained:  Verbal   Consent given by:  Patient   Risks, benefits, and alternatives were discussed: yes     Risks discussed:  Infection, need for additional repair, vascular damage, poor wound healing, nerve damage,  poor cosmetic result, pain, retained foreign body and tendon damage   Alternatives discussed:  Delayed treatment, no treatment, observation and referral Universal protocol:    Procedure explained and questions answered to patient or proxy's satisfaction: yes     Patient identity confirmed:  Verbally with patient Anesthesia:    Anesthesia method:  Local infiltration   Local anesthetic:  Lidocaine 1% WITH epi Laceration details:    Location:  Scalp   Scalp location:  Occipital   Length (cm):  3.5 Pre-procedure details:    Preparation:  Patient was prepped and draped in usual sterile fashion and imaging obtained to evaluate for foreign bodies Exploration:    Limited defect created (wound extended): no     Hemostasis achieved  with:  Direct pressure   Imaging obtained: x-ray     Imaging outcome: foreign body not noted     Wound exploration: wound explored through full range of motion and entire depth of wound visualized     Wound extent: areolar tissue violated and fascia violated     Contaminated: no   Treatment:    Area cleansed with:  Saline and chlorhexidine   Amount of cleaning:  Extensive   Irrigation solution:  Sterile saline   Irrigation volume:  549m   Irrigation method:  Syringe   Visualized foreign bodies/material removed: no     Debridement:  None   Undermining:  None   Scar revision: no     Layers/structures repaired:  GJanae Sauce    Suture size:  4-0   Suture material:  Vicryl   Suture technique:  Simple interrupted   Number of sutures:  8 Skin repair:    Repair method:  Staples   Number of staples:  5 Approximation:    Approximation:  Close Repair type:    Repair type:  Intermediate Post-procedure details:    Dressing:  Open (no dressing)   Procedure completion:  Tolerated well, no immediate complications     Medications Ordered in ED Medications  Tdap (BOOSTRIX) injection 0.5 mL (has no administration in time range)  lidocaine-EPINEPHrine (PF)  (XYLOCAINE-EPINEPHrine) 1 %-1:200000 (PF) injection 10 mL (has no administration in time range)  HYDROcodone-acetaminophen (NORCO/VICODIN) 5-325 MG per tablet 1 tablet (has no administration in time range)    ED Course/ Medical Decision Making/ A&P Clinical Course as of 11/08/21 1927  Fri Nov 08, 2021  1922 Consulted Dr. MGlenford Peersof neurosurgery.  He recommended getting placed his blood pressure below 1892systolic with hydralazine or labetalol.  Repeat CT scan of head in 6 hours for reassessment. [CR]    Clinical Course User Index [CR] RWilnette Kales PA                           Medical Decision Making Amount and/or Complexity of Data Reviewed Labs: ordered. Radiology: ordered.  Risk Prescription drug management.   This patient presents to the ED for concern of fall, this involves an extensive number of treatment options, and is a complaint that carries with it a high risk of complications and morbidity.  The differential diagnosis includes intracranial hemorrhage, fracture, laceration, foreign body retainment, exsanguination   Co morbidities that complicate the patient evaluation  See HPI   Additional history obtained:  Additional history obtained from EMR External records from outside source obtained and reviewed including previous GFR of 23 from 11/01/2021   Lab Tests:  Laboratory studies pending upon shift change.  Imaging Studies ordered:  I ordered imaging studies including CT head, CT C-spine, CT chest, left shoulder x-ray I independently visualized and interpreted imaging which showed  CT head, C-spine: Small amount of subarachnoid hemorrhage at the right frontal pole, over the anterior left temporal lobe and in the left sylvian fissure.  Posterior scalp laceration without skull fracture.  No acute fracture or static subluxation of the cervical spine. CT chest: No acute abnormality.  Findings prior granulomatous disease, bibasilar scarring, findings suggesting  anemia. Left shoulder x-ray: No acute fracture or subluxation of left shoulder. I agree with the radiologist interpretation  Cardiac Monitoring: / EKG:  The patient was maintained on a cardiac monitor.  I personally viewed and interpreted the cardiac monitored which showed an underlying rhythm of:  Sinus rhythm   Consultations Obtained:  See ED course  Problem List / ED Course / Critical interventions / Medication management  Subarachnoid hemorrhage I ordered medication including hydralazine for antihypertensive therapy, Xylocaine with epinephrine for local infiltrative anesthetic, Tdap for tetanus vaccination update   Reevaluation of the patient after these medicines showed that the patient improved I have reviewed the patients home medicines and have made adjustments as needed   Social Determinants of Health:  Denies tobacco/alcohol/illicit drug use   Test / Admission - Considered:  Vitals signs significant for 174/87.  Patient was given IV hydralazine per neurosurgeons recommendation.. Otherwise within normal range and stable throughout visit. Laboratory/imaging studies significant for: See above Patient has known subarachnoid hemorrhage.  He was given IV antihypertensive therapy while emergency department still awaiting blood pressure response.  Patient's tetanus was updated while in the emergency department.  Laceration repair was conducted as above.  Patient with an blood pressure goals of neurosurgeons recommendation.  Awaiting second CT scan to check for potential progression of subarachnoid hemorrhage as noted on for CT imaging.  At shift change, patient care handed off to Dr. Cindee Lame        Final Clinical Impression(s) / ED Diagnoses Final diagnoses:  None    Rx / DC Orders ED Discharge Orders     None         Wilnette Kales, [place] 11/09/21 1854    Audley Hose, MD 11/21/21 [phone]

## 2021-11-08 NOTE — ED Provider Notes (Signed)
11:50 PM Assumed care from Dr. Mayra Neer, please see their note for full history, physical and decision making until this point. In brief this is a 76 y.o. year old female who presented to the ED tonight with Fall (W/ head laceration)     Fall with small SAH. BP goal <263 systolic per NSG. Repeat CT - call back.   Repeat CT unchanged. Neuro exam unchanged. D/w NSG, recommend fu in 4-6 weeks. Discussed concussion, wound care and return precautions. All questions answered from patient and family.   Discharge instructions, including strict return precautions for new or worsening symptoms, given. Patient and/or family verbalized understanding and agreement with the plan as described.   Labs, studies and imaging reviewed by myself and considered in medical decision making if ordered. Imaging interpreted by radiology.  Labs Reviewed  CBC WITH DIFFERENTIAL/PLATELET - Abnormal; Notable for the following components:      Result Value   RBC 3.54 (*)    Hemoglobin 10.9 (*)    HCT 34.5 (*)    Abs Immature Granulocytes 0.08 (*)    All other components within normal limits  BASIC METABOLIC PANEL - Abnormal; Notable for the following components:   Sodium 134 (*)    CO2 20 (*)    Glucose, Bld 203 (*)    BUN 24 (*)    Creatinine, Ser 1.61 (*)    GFR, Estimated 33 (*)    All other components within normal limits  PROTIME-INR    DG Shoulder Left  Final Result    CT Head Wo Contrast  Final Result    CT Cervical Spine Wo Contrast  Final Result    CT Chest Wo Contrast  Final Result    CT Head Wo Contrast    (Results Pending)    No follow-ups on file.    Jaecob Lowden, Corene Cornea, MD 11/09/21 [phone]

## 2021-11-08 NOTE — ED Triage Notes (Signed)
PT BIB [facility] for a fall from about 3 steps up, hitting back of head.  No LOC reported, no thinners.  EMS reports approx. 3 inch laceration with heavy bleeding on scene.  Pressure was applied and head was wrapped on arrival.  PT complains of cervical tenderness.  C-collar in place. PT is A&O x4  EMS vitals 180/98 hr 80 98% RA cbg 259  PT had another fall on 7/31 and was not evaluated.  Pt has been seen by orthopedics since and dx with a vertebral fx of unknown location by pt. She thinks somewhere in lower lumbar or sacral area.  Pt was also just released from [place] after a reported 2 day admission for cardiac workup.

## 2021-11-09 ENCOUNTER — Emergency Department (HOSPITAL_COMMUNITY): Payer: PPO

## 2021-11-09 DIAGNOSIS — S0003XA Contusion of scalp, initial encounter: Secondary | ICD-10-CM | POA: Diagnosis not present

## 2021-11-09 DIAGNOSIS — S0101XA Laceration without foreign body of scalp, initial encounter: Secondary | ICD-10-CM | POA: Diagnosis not present

## 2021-11-09 NOTE — ED Notes (Signed)
Patient transported to CT 

## 2021-11-09 NOTE — ED Notes (Signed)
DC instructions reviewed by myself and Dr. Dolly Rias. Pt and family verbalized understanding.  PT DC.

## 2021-11-15 DIAGNOSIS — M549 Dorsalgia, unspecified: Secondary | ICD-10-CM | POA: Diagnosis not present

## 2021-11-15 DIAGNOSIS — N183 Chronic kidney disease, stage 3 unspecified: Secondary | ICD-10-CM | POA: Diagnosis not present

## 2021-11-15 DIAGNOSIS — I129 Hypertensive chronic kidney disease with stage 1 through stage 4 chronic kidney disease, or unspecified chronic kidney disease: Secondary | ICD-10-CM | POA: Diagnosis not present

## 2021-11-15 DIAGNOSIS — R1314 Dysphagia, pharyngoesophageal phase: Secondary | ICD-10-CM | POA: Diagnosis not present

## 2021-11-15 DIAGNOSIS — N179 Acute kidney failure, unspecified: Secondary | ICD-10-CM | POA: Diagnosis not present

## 2021-11-15 DIAGNOSIS — R112 Nausea with vomiting, unspecified: Secondary | ICD-10-CM | POA: Diagnosis not present

## 2021-11-15 DIAGNOSIS — R079 Chest pain, unspecified: Secondary | ICD-10-CM | POA: Diagnosis not present

## 2021-11-15 DIAGNOSIS — R0600 Dyspnea, unspecified: Secondary | ICD-10-CM | POA: Diagnosis not present

## 2021-11-15 DIAGNOSIS — N390 Urinary tract infection, site not specified: Secondary | ICD-10-CM | POA: Diagnosis not present

## 2021-11-15 DIAGNOSIS — R2681 Unsteadiness on feet: Secondary | ICD-10-CM | POA: Diagnosis not present

## 2021-11-15 DIAGNOSIS — R0789 Other chest pain: Secondary | ICD-10-CM | POA: Diagnosis not present

## 2021-11-15 DIAGNOSIS — E860 Dehydration: Secondary | ICD-10-CM | POA: Diagnosis not present

## 2021-11-21 DIAGNOSIS — I609 Nontraumatic subarachnoid hemorrhage, unspecified: Secondary | ICD-10-CM | POA: Diagnosis not present

## 2021-11-21 DIAGNOSIS — M50222 Other cervical disc displacement at C5-C6 level: Secondary | ICD-10-CM | POA: Diagnosis not present

## 2021-11-21 DIAGNOSIS — M47813 Spondylosis without myelopathy or radiculopathy, cervicothoracic region: Secondary | ICD-10-CM | POA: Diagnosis not present

## 2021-11-21 DIAGNOSIS — M50322 Other cervical disc degeneration at C5-C6 level: Secondary | ICD-10-CM | POA: Diagnosis not present

## 2021-11-21 DIAGNOSIS — G319 Degenerative disease of nervous system, unspecified: Secondary | ICD-10-CM | POA: Diagnosis not present

## 2021-11-21 DIAGNOSIS — S199XXA Unspecified injury of neck, initial encounter: Secondary | ICD-10-CM | POA: Diagnosis not present

## 2021-11-21 DIAGNOSIS — M47812 Spondylosis without myelopathy or radiculopathy, cervical region: Secondary | ICD-10-CM | POA: Diagnosis not present

## 2021-11-21 DIAGNOSIS — R42 Dizziness and giddiness: Secondary | ICD-10-CM | POA: Diagnosis not present

## 2021-11-21 DIAGNOSIS — I69098 Other sequelae following nontraumatic subarachnoid hemorrhage: Secondary | ICD-10-CM | POA: Diagnosis not present

## 2021-11-22 ENCOUNTER — Ambulatory Visit: Payer: Self-pay | Admitting: Licensed Clinical Social Worker

## 2021-11-22 NOTE — Patient Outreach (Signed)
  Care Coordination   Initial Visit Note   11/22/2021 Name: Julia Logan MRN: [id number] DOB: Nov 17, 1945  Julia Logan is a 76 y.o. year old female who sees Pcp, No for primary care. I spoke with  Julia Logan by phone today.  What matters to the patients health and wellness today?  Patient experiencing pain, but advised she is working with an Therapist, sports from [employer] who is assisting.    Goals Addressed               This Visit's Progress     MSW Care Coordination (pt-stated)        Patient daughter and spouse continues to care for patient.  Patient not feeling well. Denied needing  RNCM. Patient advise she is visited by an Therapist, [facility] from [facility].  SW completed SDOH screening and chart review. No further assistance is needed at this time.         SDOH assessments and interventions completed:  Yes     Care Coordination Interventions Activated:  Yes  Care Coordination Interventions:  Yes, provided   Follow up plan: No further intervention required.   Encounter Outcome:  Pt. Visit Completed   Lenor Derrick , MSW Social Worker [facility] Care Management  [phone]

## 2021-11-29 DIAGNOSIS — N183 Chronic kidney disease, stage 3 unspecified: Secondary | ICD-10-CM | POA: Diagnosis not present

## 2021-11-29 DIAGNOSIS — I129 Hypertensive chronic kidney disease with stage 1 through stage 4 chronic kidney disease, or unspecified chronic kidney disease: Secondary | ICD-10-CM | POA: Diagnosis not present

## 2021-11-29 DIAGNOSIS — R2681 Unsteadiness on feet: Secondary | ICD-10-CM | POA: Diagnosis not present

## 2021-11-29 DIAGNOSIS — I619 Nontraumatic intracerebral hemorrhage, unspecified: Secondary | ICD-10-CM | POA: Diagnosis not present

## 2021-11-29 DIAGNOSIS — S14109A Unspecified injury at unspecified level of cervical spinal cord, initial encounter: Secondary | ICD-10-CM | POA: Diagnosis not present

## 2021-12-05 DIAGNOSIS — R2689 Other abnormalities of gait and mobility: Secondary | ICD-10-CM | POA: Diagnosis not present

## 2021-12-05 DIAGNOSIS — M6281 Muscle weakness (generalized): Secondary | ICD-10-CM | POA: Diagnosis not present

## 2021-12-11 DIAGNOSIS — R2689 Other abnormalities of gait and mobility: Secondary | ICD-10-CM | POA: Diagnosis not present

## 2021-12-11 DIAGNOSIS — M6281 Muscle weakness (generalized): Secondary | ICD-10-CM | POA: Diagnosis not present

## 2021-12-13 DIAGNOSIS — I2510 Atherosclerotic heart disease of native coronary artery without angina pectoris: Secondary | ICD-10-CM | POA: Diagnosis not present

## 2021-12-13 DIAGNOSIS — E1129 Type 2 diabetes mellitus with other diabetic kidney complication: Secondary | ICD-10-CM | POA: Diagnosis not present

## 2021-12-13 DIAGNOSIS — Z23 Encounter for immunization: Secondary | ICD-10-CM | POA: Diagnosis not present

## 2021-12-13 DIAGNOSIS — D509 Iron deficiency anemia, unspecified: Secondary | ICD-10-CM | POA: Diagnosis not present

## 2021-12-13 DIAGNOSIS — Z9861 Coronary angioplasty status: Secondary | ICD-10-CM | POA: Diagnosis not present

## 2021-12-13 DIAGNOSIS — F320 Major depressive disorder, single episode, mild: Secondary | ICD-10-CM | POA: Diagnosis not present

## 2021-12-13 DIAGNOSIS — I129 Hypertensive chronic kidney disease with stage 1 through stage 4 chronic kidney disease, or unspecified chronic kidney disease: Secondary | ICD-10-CM | POA: Diagnosis not present

## 2021-12-13 DIAGNOSIS — R809 Proteinuria, unspecified: Secondary | ICD-10-CM | POA: Diagnosis not present

## 2021-12-13 DIAGNOSIS — R110 Nausea: Secondary | ICD-10-CM | POA: Diagnosis not present

## 2021-12-13 DIAGNOSIS — I619 Nontraumatic intracerebral hemorrhage, unspecified: Secondary | ICD-10-CM | POA: Diagnosis not present

## 2021-12-13 DIAGNOSIS — N183 Chronic kidney disease, stage 3 unspecified: Secondary | ICD-10-CM | POA: Diagnosis not present

## 2021-12-13 DIAGNOSIS — E785 Hyperlipidemia, unspecified: Secondary | ICD-10-CM | POA: Diagnosis not present

## 2021-12-16 DIAGNOSIS — R2689 Other abnormalities of gait and mobility: Secondary | ICD-10-CM | POA: Diagnosis not present

## 2021-12-16 DIAGNOSIS — M6281 Muscle weakness (generalized): Secondary | ICD-10-CM | POA: Diagnosis not present

## 2021-12-18 NOTE — Progress Notes (Signed)
[facility] Initial Visit:  Patient Care Team: Pcp, No as PCP - General Pamala Hurry, MD (Urology)  CHIEF COMPLAINTS/PURPOSE OF CONSULTATION:  Oncology History   No history exists.    HISTORY OF PRESENTING ILLNESS: Julia Logan 76 y.o. female is here because of anemia  Medical history notable for cervical spondylosis, colon polyps, diverticulitis, GERD, hypertension, coronary artery disease with angioplasty, osteoarthritis/osteoporosis, restless leg syndrome, hyperlipidemia, diabetes mellitus type 2, kidney cancer treated with partial nephrectomy, hysterectomy  November 08 2019:  EGD and esophageal dilation.  Benign appearing 1.3 cm distal esophageal stricture at the GE junction.  The endoscope passed in the stomach with no resistance.  Stomach had multiple polyps in the fundus and body which were biopsied.  There was no evidence of gastric antral vascular ectatic lesions.  A hiatal hernia was present.  Duodenum was normal.  Antral and fundal biopsies were obtained for H. pylori testing. Colonoscopy normal to terminal ileum  November 08, 2021: Presented to [facility] emergency room following a fall at home.  She was walking back up her steps at home when she "missed stepped" walking up the steps into her house and fell backwards landing with the back of her head hitting the concrete.  She denied loss of consciousness.   CT head Small amount of subarachnoid hemorrhage at the right frontal pole, over the anterior left temporal lobe and in the left Sylvian fissure.  Posterior scalp laceration without skull fracture.  No acute fracture or static subluxation of the cervical spine.  Patient was managed conservatively and discharged home  December 13, 2021: WBC 3.4 hemoglobin 9.4 MCV 92 white count 155; 6626 lymphs 10 monos 3 eos 1 basophil.  Iron saturation 27% CMP notable for glucose of 114 creatinine 1.46 with calculated GFR of 37 sodium 146 albumin 3.9 total  protein 5.8.  Hemoglobin A1c 5.7  December 19 2021:  [facility]   Review of Systems - Oncology  MEDICAL HISTORY: Past Medical History:  Diagnosis Date   Cervical spondylosis without myelopathy    Colon polyps    Diverticulitis of colon (without mention of hemorrhage)(562.11)    Esophageal reflux    Essential hypertension, benign    Heart disease    History of kidney cancer    Iron deficiency anemia, unspecified    Osteoarthrosis, unspecified whether generalized or localized, shoulder region    Osteoporosis    Other and unspecified hyperlipidemia    Restless legs syndrome (RLS)    Type II or unspecified type diabetes mellitus without mention of complication, not stated as uncontrolled     SURGICAL HISTORY: Past Surgical History:  Procedure Laterality Date   APPENDECTOMY  03/18/1975   BUNIONECTOMY     CORONARY BALLOON ANGIOPLASTY N/A 11/09/2017   Procedure: CORONARY BALLOON ANGIOPLASTY;  Surgeon: Martinique, Peter M, MD;  Location: [facility];  Service: Cardiovascular;  Laterality: N/A;   CORONARY STENT INTERVENTION N/A 01/01/2017   Procedure: CORONARY STENT INTERVENTION;  Surgeon: Martinique, Peter M, MD;  Location: [facility];  Service: Cardiovascular;  Laterality: N/A;   KNEE SURGERY     LEFT HEART CATH AND CORONARY ANGIOGRAPHY N/A 01/01/2017   Procedure: LEFT HEART CATH AND CORONARY ANGIOGRAPHY;  Surgeon: Martinique, Peter M, MD;  Location: [place] [facility];  Service: Cardiovascular;  Laterality: N/A;   LEFT HEART CATH AND CORONARY ANGIOGRAPHY N/A 11/09/2017   Procedure: LEFT HEART CATH AND CORONARY ANGIOGRAPHY;  Surgeon: Martinique, Peter M,  MD;  Location: [facility];  Service: Cardiovascular;  Laterality: N/A;   PARTIAL NEPHRECTOMY Left 2017   VAGINAL HYSTERECTOMY  03/18/1975    SOCIAL HISTORY: Social History   Socioeconomic History   Marital status: Married    Spouse name: Not on file   Number of children: 2   Years of education:  college   Highest education level: Master's degree (e.g., MA, MS, MEng, MEd, MSW, MBA)  Occupational History   Occupation: retired Pharmacist, hospital  Tobacco Use   Smoking status: Never   Smokeless tobacco: Never  Vaping Use   Vaping Use: Never used  Substance and Sexual Activity   Alcohol use: No   Drug use: No   Sexual activity: Not on file  Other Topics Concern   Not on file  Social History Narrative   Lives at home with husband.   Right-handed.   No daily caffeine use.   Social Determinants of Health   Financial Resource Strain: Not on file  Food Insecurity: No Food Insecurity (11/22/2021)   Hunger Vital Sign    Worried About Running Out of Food in the Last Year: Never true    Ran Out of Food in the Last Year: Never true  Transportation Needs: No Transportation Needs (11/22/2021)   PRAPARE - Hydrologist (Medical): No    Lack of Transportation (Non-Medical): No  Physical Activity: Not on file  Stress: Not on file  Social Connections: Not on file  Intimate Partner Violence: Not on file    FAMILY HISTORY Family History  Problem Relation Age of Onset   Diabetes Maternal Grandfather    Heart attack Father    Diabetes Sister    Hypertension Sister    Diabetes Brother    Hypertension Brother     ALLERGIES:  is allergic to gabapentin, ticagrelor, and codeine.  MEDICATIONS:  Current Outpatient Medications  Medication Sig Dispense Refill   aspirin 81 MG tablet Take 81 mg by mouth daily.     atorvastatin (LIPITOR) 80 MG tablet TAKE 1 TABLET(80 MG) BY MOUTH DAILY 90 tablet 1   Calcium Carbonate-Vit D-Min (CALCIUM 1200) 1200-1000 MG-UNIT CHEW Chew 1 tablet by mouth 2 (two) times daily.      estradiol (ESTRACE) 0.1 MG/GM vaginal cream Place 1 Applicatorful vaginally 3 (three) times a week.     ezetimibe (ZETIA) 10 MG tablet Take 10 mg by mouth daily.   3   Ferrous Sulfate (IRON) 325 (65 Fe) MG TABS Take 325-650 mg by mouth every other day. Take 325 mg by  mouth in the morning EVERY OTHER DAY     glimepiride (AMARYL) 4 MG tablet Take 4 mg by mouth daily with breakfast.   3   metoprolol tartrate (LOPRESSOR) 25 MG tablet Take 0.5 tablets (12.5 mg total) by mouth 2 (two) times daily. 90 tablet 3   pantoprazole (PROTONIX) 40 MG tablet Take 40 mg by mouth daily.     Probiotic Product (PROBIOTIC PO) Take 1 capsule by mouth daily.     ranolazine (RANEXA) 1000 MG SR tablet TAKE 1 TABLET(1000 MG) BY MOUTH TWICE DAILY 180 tablet 1   Rimegepant Sulfate (NURTEC) 75 MG TBDP Take 75 mg by mouth daily as needed. For migraines. Take as close to onset of migraine as possible. One daily maximum. 6 tablet 0   telmisartan-hydrochlorothiazide (MICARDIS HCT) 40-12.5 MG tablet Take 1 tablet by mouth daily. 30 tablet 3   No current facility-administered medications for this visit.  PHYSICAL EXAMINATION:  ECOG PERFORMANCE STATUS: {CHL ONC ECOG PS:[id number]}   There were no vitals filed for this visit.  There were no vitals filed for this visit.   Physical Exam   LABORATORY DATA: I have personally reviewed the data as listed:  No visits with results within 1 Month(s) from this visit.  Latest known visit with results is:  Admission on 11/08/2021, Discharged on 11/09/2021  Component Date Value Ref Range Status   Prothrombin Time 11/08/2021 13.6  11.4 - 15.2 seconds Final   INR 11/08/2021 1.1  0.8 - 1.2 Final   Comment: (NOTE) INR goal varies based on device and disease states. Performed at [facility], [place] [address] [place], [place] [address]    WBC 11/08/2021 7.0  4.0 - 10.5 K/uL Final   RBC 11/08/2021 3.54 (L)  3.87 - 5.11 MIL/uL Final   Hemoglobin 11/08/2021 10.9 (L)  12.0 - 15.0 g/dL Final   HCT 11/08/2021 34.5 (L)  36.0 - 46.0 % Final   MCV 11/08/2021 97.5  80.0 - 100.0 fL Final   MCH 11/08/2021 30.8  26.0 - 34.0 pg Final   MCHC 11/08/2021 31.6  30.0 - 36.0 g/dL Final   RDW 11/08/2021 12.3  11.5 - 15.5 % Final   Platelets 11/08/2021  217  150 - 400 K/uL Final   nRBC 11/08/2021 0.0  0.0 - 0.2 % Final   Neutrophils Relative % 11/08/2021 86  % Final   Neutro Abs 11/08/2021 6.0  1.7 - 7.7 K/uL Final   Lymphocytes Relative 11/08/2021 9  % Final   Lymphs Abs 11/08/2021 0.7  0.7 - 4.0 K/uL Final   Monocytes Relative 11/08/2021 4  % Final   Monocytes Absolute 11/08/2021 0.3  0.1 - 1.0 K/uL Final   Eosinophils Relative 11/08/2021 0  % Final   Eosinophils Absolute 11/08/2021 0.0  0.0 - 0.5 K/uL Final   Basophils Relative 11/08/2021 0  % Final   Basophils Absolute 11/08/2021 0.0  0.0 - 0.1 K/uL Final   Immature Granulocytes 11/08/2021 1  % Final   Abs Immature Granulocytes 11/08/2021 0.08 (H)  0.00 - 0.07 K/uL Final   Performed at [facility], [place] [address], [place], [place] [address]   Sodium 11/08/2021 134 (L)  135 - 145 mmol/L Final   Potassium 11/08/2021 4.0  3.5 - 5.1 mmol/L Final   Chloride 11/08/2021 105  98 - 111 mmol/L Final   CO2 11/08/2021 20 (L)  22 - 32 mmol/L Final   Glucose, Bld 11/08/2021 203 (H)  70 - 99 mg/dL Final   Glucose reference range applies only to samples taken after fasting for at least 8 hours.   BUN 11/08/2021 24 (H)  8 - 23 mg/dL Final   Creatinine, Ser 11/08/2021 1.61 (H)  0.44 - 1.00 mg/dL Final   Calcium 11/08/2021 10.1  8.9 - 10.3 mg/dL Final   GFR, Estimated 11/08/2021 33 (L)  >60 mL/min Final   Comment: (NOTE) Calculated using the CKD-EPI Creatinine Equation (2021)    Anion gap 11/08/2021 9  5 - 15 Final   Performed at [facility], [place] [address] [place], [place] [address]    RADIOGRAPHIC STUDIES: I have personally reviewed the radiological images as listed and agree with the findings in the report  No results found.  ASSESSMENT/PLAN Cancer Staging  No matching staging information was found for the patient.   No problem-specific Assessment & Plan notes found for this encounter.   No  orders of the defined types were placed in this encounter.   All questions  were answered. The patient knows to call the clinic with any problems, questions or concerns.  This note was electronically signed.    Barbee Cough, MD  12/18/2021 1:44 PM

## 2021-12-19 ENCOUNTER — Telehealth: Payer: Self-pay | Admitting: Oncology

## 2021-12-19 ENCOUNTER — Inpatient Hospital Stay: Payer: PPO

## 2021-12-19 ENCOUNTER — Inpatient Hospital Stay: Payer: PPO | Attending: Oncology | Admitting: Oncology

## 2021-12-19 ENCOUNTER — Encounter: Payer: Self-pay | Admitting: Oncology

## 2021-12-19 VITALS — BP 182/73 | HR 67 | Temp 97.8°F | Resp 14 | Ht 60.4 in | Wt 156.5 lb

## 2021-12-19 DIAGNOSIS — D500 Iron deficiency anemia secondary to blood loss (chronic): Secondary | ICD-10-CM

## 2021-12-19 DIAGNOSIS — Z905 Acquired absence of kidney: Secondary | ICD-10-CM | POA: Diagnosis not present

## 2021-12-19 DIAGNOSIS — D649 Anemia, unspecified: Secondary | ICD-10-CM | POA: Diagnosis not present

## 2021-12-19 DIAGNOSIS — C641 Malignant neoplasm of right kidney, except renal pelvis: Secondary | ICD-10-CM

## 2021-12-19 DIAGNOSIS — I10 Essential (primary) hypertension: Secondary | ICD-10-CM | POA: Diagnosis not present

## 2021-12-19 DIAGNOSIS — E119 Type 2 diabetes mellitus without complications: Secondary | ICD-10-CM | POA: Diagnosis not present

## 2021-12-19 DIAGNOSIS — Z85528 Personal history of other malignant neoplasm of kidney: Secondary | ICD-10-CM | POA: Diagnosis not present

## 2021-12-19 DIAGNOSIS — C642 Malignant neoplasm of left kidney, except renal pelvis: Secondary | ICD-10-CM | POA: Diagnosis not present

## 2021-12-19 DIAGNOSIS — I25119 Atherosclerotic heart disease of native coronary artery with unspecified angina pectoris: Secondary | ICD-10-CM | POA: Diagnosis not present

## 2021-12-19 DIAGNOSIS — N183 Chronic kidney disease, stage 3 unspecified: Secondary | ICD-10-CM | POA: Diagnosis not present

## 2021-12-19 DIAGNOSIS — D631 Anemia in chronic kidney disease: Secondary | ICD-10-CM

## 2021-12-19 HISTORY — DX: Iron deficiency anemia secondary to blood loss (chronic): D50.0

## 2021-12-19 LAB — CBC WITH DIFFERENTIAL (CANCER CENTER ONLY)
Abs Immature Granulocytes: 0.01 10*3/uL (ref 0.00–0.07)
Basophils Absolute: 0 10*3/uL (ref 0.0–0.1)
Basophils Relative: 2 %
Eosinophils Absolute: 0.1 10*3/uL (ref 0.0–0.5)
Eosinophils Relative: 4 %
HCT: 29.4 % — ABNORMAL LOW (ref 36.0–46.0)
Hemoglobin: 9.4 g/dL — ABNORMAL LOW (ref 12.0–15.0)
Immature Granulocytes: 0 %
Lymphocytes Relative: 31 %
Lymphs Abs: 0.8 10*3/uL (ref 0.7–4.0)
MCH: 30.1 pg (ref 26.0–34.0)
MCHC: 32 g/dL (ref 30.0–36.0)
MCV: 94.2 fL (ref 80.0–100.0)
Monocytes Absolute: 0.3 10*3/uL (ref 0.1–1.0)
Monocytes Relative: 11 %
Neutro Abs: 1.4 10*3/uL — ABNORMAL LOW (ref 1.7–7.7)
Neutrophils Relative %: 52 %
Platelet Count: 167 10*3/uL (ref 150–400)
RBC: 3.12 MIL/uL — ABNORMAL LOW (ref 3.87–5.11)
RDW: 14.1 % (ref 11.5–15.5)
WBC Count: 2.7 10*3/uL — ABNORMAL LOW (ref 4.0–10.5)
nRBC: 0 % (ref 0.0–0.2)

## 2021-12-19 LAB — RETICULOCYTES
Immature Retic Fract: 10.5 % (ref 2.3–15.9)
RBC.: 3.01 MIL/uL — ABNORMAL LOW (ref 3.87–5.11)
Retic Count, Absolute: 70.7 10*3/uL (ref 19.0–186.0)
Retic Ct Pct: 2.4 % (ref 0.4–3.1)

## 2021-12-19 LAB — FERRITIN: Ferritin: 501 ng/mL — ABNORMAL HIGH (ref 11–307)

## 2021-12-19 LAB — DIRECT ANTIGLOBULIN TEST (NOT AT ARMC)
DAT, IgG: NEGATIVE
DAT, complement: NEGATIVE

## 2021-12-19 LAB — FOLATE: Folate: 6.2 ng/mL (ref >5.9)

## 2021-12-19 LAB — VITAMIN B12: Vitamin B-12: 1064 pg/mL — ABNORMAL HIGH (ref 180–914)

## 2021-12-19 NOTE — Telephone Encounter (Signed)
Patient has been scheduled for follow-up visit per 12/19/21 los. Pt given an appt calendar with date and time.    Pt requested that I cancel the [place] appts with our location. She wants to go to [facility] to have it done there.   I notified Estill Bamberg, order pending.

## 2021-12-20 LAB — KAPPA/LAMBDA LIGHT CHAINS
Kappa free light chain: 23 mg/L — ABNORMAL HIGH (ref 3.3–19.4)
Kappa, lambda light chain ratio: 1.38 (ref 0.26–1.65)
Lambda free light chains: 16.7 mg/L (ref 5.7–26.3)

## 2021-12-20 LAB — HAPTOGLOBIN: Haptoglobin: 186 mg/dL (ref 42–346)

## 2021-12-23 ENCOUNTER — Ambulatory Visit: Payer: PPO

## 2021-12-24 DIAGNOSIS — R2689 Other abnormalities of gait and mobility: Secondary | ICD-10-CM | POA: Diagnosis not present

## 2021-12-24 DIAGNOSIS — M6281 Muscle weakness (generalized): Secondary | ICD-10-CM | POA: Diagnosis not present

## 2021-12-26 LAB — MULTIPLE MYELOMA PANEL, SERUM
Albumin SerPl Elph-Mcnc: 3.4 g/dL (ref 2.9–4.4)
Albumin/Glob SerPl: 1.5 (ref 0.7–1.7)
Alpha 1: 0.3 g/dL (ref 0.0–0.4)
Alpha2 Glob SerPl Elph-Mcnc: 0.8 g/dL (ref 0.4–1.0)
B-Globulin SerPl Elph-Mcnc: 0.7 g/dL (ref 0.7–1.3)
Gamma Glob SerPl Elph-Mcnc: 0.6 g/dL (ref 0.4–1.8)
Globulin, Total: 2.4 g/dL (ref 2.2–3.9)
IgA: 75 mg/dL (ref 64–422)
IgG (Immunoglobin G), Serum: 612 mg/dL (ref 586–1602)
IgM (Immunoglobulin M), Srm: 29 mg/dL (ref 26–217)
Total Protein ELP: 5.8 g/dL — ABNORMAL LOW (ref 6.0–8.5)

## 2021-12-30 ENCOUNTER — Ambulatory Visit: Payer: PPO

## 2021-12-31 DIAGNOSIS — R2689 Other abnormalities of gait and mobility: Secondary | ICD-10-CM | POA: Diagnosis not present

## 2021-12-31 DIAGNOSIS — M6281 Muscle weakness (generalized): Secondary | ICD-10-CM | POA: Diagnosis not present

## 2022-01-03 DIAGNOSIS — H40033 Anatomical narrow angle, bilateral: Secondary | ICD-10-CM | POA: Diagnosis not present

## 2022-01-03 DIAGNOSIS — H2511 Age-related nuclear cataract, right eye: Secondary | ICD-10-CM | POA: Diagnosis not present

## 2022-01-03 DIAGNOSIS — E119 Type 2 diabetes mellitus without complications: Secondary | ICD-10-CM | POA: Diagnosis not present

## 2022-01-03 DIAGNOSIS — H2513 Age-related nuclear cataract, bilateral: Secondary | ICD-10-CM | POA: Diagnosis not present

## 2022-01-03 DIAGNOSIS — Z01818 Encounter for other preprocedural examination: Secondary | ICD-10-CM | POA: Diagnosis not present

## 2022-01-06 DIAGNOSIS — R2689 Other abnormalities of gait and mobility: Secondary | ICD-10-CM | POA: Diagnosis not present

## 2022-01-06 DIAGNOSIS — M6281 Muscle weakness (generalized): Secondary | ICD-10-CM | POA: Diagnosis not present

## 2022-01-13 DIAGNOSIS — M6281 Muscle weakness (generalized): Secondary | ICD-10-CM | POA: Diagnosis not present

## 2022-01-13 DIAGNOSIS — R2689 Other abnormalities of gait and mobility: Secondary | ICD-10-CM | POA: Diagnosis not present

## 2022-01-14 DIAGNOSIS — H2511 Age-related nuclear cataract, right eye: Secondary | ICD-10-CM | POA: Diagnosis not present

## 2022-01-14 DIAGNOSIS — H25811 Combined forms of age-related cataract, right eye: Secondary | ICD-10-CM | POA: Diagnosis not present

## 2022-01-14 DIAGNOSIS — H259 Unspecified age-related cataract: Secondary | ICD-10-CM | POA: Diagnosis not present

## 2022-01-14 DIAGNOSIS — H40003 Preglaucoma, unspecified, bilateral: Secondary | ICD-10-CM | POA: Diagnosis not present

## 2022-01-14 DIAGNOSIS — E785 Hyperlipidemia, unspecified: Secondary | ICD-10-CM | POA: Diagnosis not present

## 2022-01-14 DIAGNOSIS — Z79899 Other long term (current) drug therapy: Secondary | ICD-10-CM | POA: Diagnosis not present

## 2022-01-14 DIAGNOSIS — Z7982 Long term (current) use of aspirin: Secondary | ICD-10-CM | POA: Diagnosis not present

## 2022-01-14 DIAGNOSIS — Z7984 Long term (current) use of oral hypoglycemic drugs: Secondary | ICD-10-CM | POA: Diagnosis not present

## 2022-01-14 DIAGNOSIS — E1136 Type 2 diabetes mellitus with diabetic cataract: Secondary | ICD-10-CM | POA: Diagnosis not present

## 2022-01-14 DIAGNOSIS — I10 Essential (primary) hypertension: Secondary | ICD-10-CM | POA: Diagnosis not present

## 2022-01-14 DIAGNOSIS — E119 Type 2 diabetes mellitus without complications: Secondary | ICD-10-CM | POA: Diagnosis not present

## 2022-01-14 DIAGNOSIS — E1129 Type 2 diabetes mellitus with other diabetic kidney complication: Secondary | ICD-10-CM | POA: Diagnosis not present

## 2022-01-15 NOTE — Progress Notes (Unsigned)
[facility] Initial Visit:  Patient Care Team: Nicoletta Dress, MD as PCP - General (Internal Medicine) Pamala Hurry, MD (Urology)  CHIEF COMPLAINTS/PURPOSE OF CONSULTATION:  Oncology History   No history exists.    HISTORY OF PRESENTING ILLNESS: Julia Logan 76 y.o. female is here because of anemia  Medical history notable for cervical spondylosis, colon polyps, diverticulitis, GERD, hypertension, coronary artery disease with angioplasty, osteoarthritis/osteoporosis, restless leg syndrome, hyperlipidemia, diabetes mellitus type 2, kidney cancer treated with partial nephrectomy, hysterectomy  November 08 2019:  EGD and esophageal dilation.  Benign appearing 1.3 cm distal esophageal stricture at the GE junction.  The endoscope passed in the stomach with no resistance.  Stomach had multiple polyps in the fundus and body which were biopsied.  There was no evidence of gastric antral vascular ectatic lesions.  A hiatal hernia was present.  Duodenum was normal.  Antral and fundal biopsies were obtained for H. pylori testing. Colonoscopy normal to terminal ileum.  Pathology from the EGD showed fundic gland polyps, no metaplasia, dysplasia or carcinoma.  H pylori negative.   November 08, 2021: Presented to [facility] emergency room following a fall at home.  She was walking back up her steps at home when she "missed stepped" walking up the steps into her house and fell backwards landing with the back of her head hitting the concrete.  She denied loss of consciousness.   CT head Small amount of subarachnoid hemorrhage at the right frontal pole, over the anterior left temporal lobe and in the left Sylvian fissure.  Posterior scalp laceration without skull fracture.  No acute fracture or static subluxation of the cervical spine.  Patient was managed conservatively and discharged home  December 13, 2021: WBC 3.4 hemoglobin 9.4 MCV 92 white count 155; 6626 lymphs 10  monos 3 eos 1 basophil.  Iron saturation 27% CMP notable for glucose of 114 creatinine 1.46 with calculated GFR of 37 sodium 146 albumin 3.9 total protein 5.8.  Hemoglobin A1c 5.7  December 19 2021:  [facility] Hematology Consult Patient states that she has a 6 year history of anemia.   Takes oral iron only MWF due to constipation.  Has not received IV iron/ required PRBC's once in the past for management of post partum hemorrhage.   Has a normal diet.  No history of hemorrhage postoperatively requiring transfusion.     No hematochezia, melena, hemoptysis, hematuria.  No history of intra-articular or soft tissue bleeding  Does not use NSAIDS for pain.  Is not taking oral anticoagulants  Takes baby ASA daily.   No/has history of abnormal bleeding in family members Patient has symptoms of fatigue, pallor,  DOE, decreased performance status.  Patient has PICA to ice.    WBC 2.7 Hgb 9.4 MCV 94 PLT 167; 52 segs 31 lymphs 11 monos 4 eos 2 basophil's.  Tick 2.4% Coombs test negative haptoglobin 186 Vitamin B12 1064.  Folate 6.2 ferritin 501 SPEP with IEP showed no paraprotein.  Serum free kappa 23 lambda 16.7 with a kappa lambda 1.38 IgG 612 IgA 75 IgM 29  January 16 2022:  Scheduled follow up for management of anemia.  Reviewed results of labs with patient.  She is fatigued in the afternoon. Discuss use of ESA which included risks and benefits.    Review of Systems - Oncology  MEDICAL HISTORY: Past Medical History:  Diagnosis Date   Cervical spondylosis without myelopathy    Colon polyps  Diverticulitis of colon (without mention of hemorrhage)(562.11)    Esophageal reflux    Essential hypertension, benign    Heart disease    History of kidney cancer    Iron deficiency anemia, unspecified    Osteoarthrosis, unspecified whether generalized or localized, shoulder region    Osteoporosis    Other and unspecified hyperlipidemia    Restless legs syndrome (RLS)    Type II or unspecified  type diabetes mellitus without mention of complication, not stated as uncontrolled     SURGICAL HISTORY: Past Surgical History:  Procedure Laterality Date   APPENDECTOMY  03/18/1975   BUNIONECTOMY     CORONARY BALLOON ANGIOPLASTY N/A 11/09/2017   Procedure: CORONARY BALLOON ANGIOPLASTY;  Surgeon: Martinique, Peter M, MD;  Location: [place] [facility];  Service: Cardiovascular;  Laterality: N/A;   CORONARY STENT INTERVENTION N/A 01/01/2017   Procedure: CORONARY STENT INTERVENTION;  Surgeon: Martinique, Peter M, MD;  Location: [place] [facility];  Service: Cardiovascular;  Laterality: N/A;   KNEE SURGERY     LEFT HEART CATH AND CORONARY ANGIOGRAPHY N/A 01/01/2017   Procedure: LEFT HEART CATH AND CORONARY ANGIOGRAPHY;  Surgeon: Martinique, Peter M, MD;  Location: [place] [facility];  Service: Cardiovascular;  Laterality: N/A;   LEFT HEART CATH AND CORONARY ANGIOGRAPHY N/A 11/09/2017   Procedure: LEFT HEART CATH AND CORONARY ANGIOGRAPHY;  Surgeon: Martinique, Peter M, MD;  Location: [place] [facility];  Service: Cardiovascular;  Laterality: N/A;   PARTIAL NEPHRECTOMY Left 2017   VAGINAL HYSTERECTOMY  03/18/1975    SOCIAL HISTORY: Social History   Socioeconomic History   Marital status: Married    Spouse name: Dispensing optician   Number of children: 2   Years of education: 27   Highest education level: Master's degree (e.g., MA, MS, MEng, MEd, MSW, MBA)  Occupational History   Occupation: retired Pharmacist, hospital  Tobacco Use   Smoking status: Never   Smokeless tobacco: Never  Vaping Use   Vaping Use: Never used  Substance and Sexual Activity   Alcohol use: No   Drug use: No   Sexual activity: Not on file  Other Topics Concern   Not on file  Social History Narrative   Lives at home with husband.   Right-handed.   No daily caffeine use.   Social Determinants of Health   Financial Resource Strain: Not on file  Food Insecurity: No Food Insecurity (11/22/2021)   Hunger Vital Sign    Worried About Running Out  of Food in the Last Year: Never true    Ran Out of Food in the Last Year: Never true  Transportation Needs: No Transportation Needs (11/22/2021)   PRAPARE - Hydrologist (Medical): No    Lack of Transportation (Non-Medical): No  Physical Activity: Not on file  Stress: Not on file  Social Connections: Not on file  Intimate Partner Violence: Not on file    FAMILY HISTORY Family History  Problem Relation Age of Onset   Diabetes Maternal Grandfather    Heart attack Father    Diabetes Sister    Hypertension Sister    Diabetes Brother    Hypertension Brother     ALLERGIES:  is allergic to gabapentin, ticagrelor, and codeine.  MEDICATIONS:  Current Outpatient Medications  Medication Sig Dispense Refill   aspirin 81 MG tablet Take 81 mg by mouth daily.     atorvastatin (LIPITOR) 80 MG tablet TAKE 1 TABLET(80 MG) BY MOUTH DAILY 90 tablet 1   Calcium  Carbonate-Vit D-Min (CALCIUM 1200) 1200-1000 MG-UNIT CHEW Chew 1 tablet by mouth 2 (two) times daily.      estradiol (ESTRACE) 0.1 MG/GM vaginal cream Place 1 Applicatorful vaginally 3 (three) times a week.     ezetimibe (ZETIA) 10 MG tablet Take 10 mg by mouth daily.   3   Ferrous Sulfate (IRON) 325 (65 Fe) MG TABS Take 325 mg by mouth every other day.     glimepiride (AMARYL) 4 MG tablet Take 4 mg by mouth daily with breakfast.   3   pantoprazole (PROTONIX) 20 MG tablet Take 20 mg by mouth daily.     Probiotic Product (PROBIOTIC PO) Take 1 capsule by mouth daily.     ranolazine (RANEXA) 1000 MG SR tablet TAKE 1 TABLET(1000 MG) BY MOUTH TWICE DAILY 180 tablet 1   metoprolol tartrate (LOPRESSOR) 25 MG tablet Take 0.5 tablets (12.5 mg total) by mouth 2 (two) times daily. 90 tablet 3   No current facility-administered medications for this visit.    PHYSICAL EXAMINATION:  ECOG PERFORMANCE STATUS: 1 - Symptomatic but completely ambulatory   Vitals:   01/16/22 1106  BP: (!) 150/66  Pulse: (!) 56  Resp: 16   Temp: 97.7 F (36.5 C)  SpO2: 95%    Filed Weights   01/16/22 1106  Weight: 154 lb 11.2 oz (70.2 kg)     Physical Exam Vitals and nursing note reviewed.  Constitutional:      General: She is not in acute distress.    Appearance: Normal appearance. She is normal weight. She is not ill-appearing, toxic-appearing or diaphoretic.     Comments: Here alone.  Comfortable  HENT:     Head: Normocephalic and atraumatic.     Right Ear: External ear normal.     Left Ear: External ear normal.     Nose: Nose normal. No congestion or rhinorrhea.  Eyes:     General: No scleral icterus.    Extraocular Movements: Extraocular movements intact.     Conjunctiva/sclera: Conjunctivae normal.     Pupils: Pupils are equal, round, and reactive to light.  Cardiovascular:     Rate and Rhythm: Normal rate and regular rhythm.     Heart sounds: Normal heart sounds. No murmur heard.    No friction rub. No gallop.  Pulmonary:     Effort: Pulmonary effort is normal. No respiratory distress.     Breath sounds: Normal breath sounds. No stridor. No wheezing, rhonchi or rales.  Chest:     Chest wall: No tenderness.  Abdominal:     General: Bowel sounds are normal. There is no distension.     Palpations: Abdomen is soft. There is no mass.     Tenderness: There is no abdominal tenderness. There is no guarding or rebound.     Hernia: No hernia is present.  Musculoskeletal:        General: No swelling, tenderness or deformity.     Cervical back: Normal range of motion and neck supple. No rigidity or tenderness.  Lymphadenopathy:     Head:     Right side of head: No submental, submandibular, tonsillar, preauricular, posterior auricular or occipital adenopathy.     Left side of head: No submental, submandibular, tonsillar, preauricular, posterior auricular or occipital adenopathy.     Cervical: No cervical adenopathy.     Right cervical: No superficial, deep or posterior cervical adenopathy.    Left  cervical: No superficial, deep or posterior cervical adenopathy.     Upper  Body:     Right upper body: No supraclavicular, axillary, pectoral or epitrochlear adenopathy.     Left upper body: No supraclavicular, axillary, pectoral or epitrochlear adenopathy.  Skin:    General: Skin is warm.     Coloration: Skin is not jaundiced or pale.     Findings: No bruising or erythema.  Neurological:     General: No focal deficit present.     Mental Status: She is alert and oriented to person, place, and time. Mental status is at baseline.     Cranial Nerves: No cranial nerve deficit.  Psychiatric:        Mood and Affect: Mood normal.        Behavior: Behavior normal.        Thought Content: Thought content normal.        Judgment: Judgment normal.      LABORATORY DATA: I have personally reviewed the data as listed:  Appointment on 12/19/2021  Component Date Value Ref Range Status   Vitamin B-12 12/19/2021 1,064 (H)  180 - 914 pg/mL Final   Comment: (NOTE) This assay is not validated for testing neonatal or myeloproliferative syndrome specimens for Vitamin B12 levels. Performed at [facility], [place] [address], [place], [place] [address]    Retic Ct Pct 12/19/2021 2.4  0.4 - 3.1 % Final   RBC. 12/19/2021 3.01 (L)  3.87 - 5.11 MIL/uL Final   Retic Count, Absolute 12/19/2021 70.7  19.0 - 186.0 K/uL Final   Immature Retic Fract 12/19/2021 10.5  2.3 - 15.9 % Final   Performed at [facility], [place] [address], [place], [place] [address]   IgG (Immunoglobin G), Serum 12/19/2021 612  586 - 1,602 mg/dL Final   IgA 12/19/2021 75  64 - 422 mg/dL Final   IgM (Immunoglobulin M), Srm 12/19/2021 29  26 - 217 mg/dL Final   Total Protein ELP 12/19/2021 5.8 (L)  6.0 - 8.5 g/dL Corrected   Albumin SerPl Elph-Mcnc 12/19/2021 3.4  2.9 - 4.4 g/dL Corrected   Alpha 1 12/19/2021 0.3  0.0 - 0.4 g/dL Corrected   Alpha2 Glob SerPl Elph-Mcnc 12/19/2021 0.8  0.4 - 1.0 g/dL  Corrected   B-Globulin SerPl Elph-Mcnc 12/19/2021 0.7  0.7 - 1.3 g/dL Corrected   Gamma Glob SerPl Elph-Mcnc 12/19/2021 0.6  0.4 - 1.8 g/dL Corrected   M Protein SerPl Elph-Mcnc 12/19/2021 Not Observed  Not Observed g/dL Corrected   Globulin, Total 12/19/2021 2.4  2.2 - 3.9 g/dL Corrected   Albumin/Glob SerPl 12/19/2021 1.5  0.7 - 1.7 Corrected   IFE 1 12/19/2021 Comment   Corrected   Comment: (NOTE) The immunofixation pattern appears unremarkable. Evidence of monoclonal protein is not apparent.    Please Note 12/19/2021 Comment   Corrected   Comment: (NOTE) Protein electrophoresis scan will follow via computer, mail, or courier delivery. Performed At: [facility] [place], [place] [id number] Rush Farmer MD [id number]    Kappa free light chain 12/19/2021 23.0 (H)  3.3 - 19.4 mg/L Final   Lambda free light chains 12/19/2021 16.7  5.7 - 26.3 mg/L Final   Kappa, lambda light chain ratio 12/19/2021 1.38  0.26 - 1.65 Final   Comment: (NOTE) Performed At: [facility] [place], [place] [id number] Rush Farmer MD [id number]    Haptoglobin 12/19/2021 186  42 - 346 mg/dL Final   Comment: (NOTE) Performed At: [facility] [place], [place] [id number] Rush Farmer MD  [id number]    Folate 12/19/2021 6.2  >5.9 ng/mL Final   Performed at [place] [address] [place], [place] [address]   Ferritin 12/19/2021 501 (H)  11 - 307 ng/mL Final   Performed at [place] [address] [place], [place] [address]   DAT, complement 12/19/2021 NEG   Final   DAT, IgG 12/19/2021    Final                   Value:NEG Performed at [facility], [place] [address] [place], [place] [address]    WBC Count 12/19/2021 2.7 (L)  4.0 - 10.5 K/uL Final   RBC 12/19/2021 3.12 (L)  3.87 - 5.11 MIL/uL Final   Hemoglobin 12/19/2021 9.4 (L)  12.0 - 15.0 g/dL Final   HCT  12/19/2021 29.4 (L)  36.0 - 46.0 % Final   MCV 12/19/2021 94.2  80.0 - 100.0 fL Final   MCH 12/19/2021 30.1  26.0 - 34.0 pg Final   MCHC 12/19/2021 32.0  30.0 - 36.0 g/dL Final   RDW 12/19/2021 14.1  11.5 - 15.5 % Final   Platelet Count 12/19/2021 167  150 - 400 K/uL Final   nRBC 12/19/2021 0.0  0.0 - 0.2 % Final   Neutrophils Relative % 12/19/2021 52  % Final   Neutro Abs 12/19/2021 1.4 (L)  1.7 - 7.7 K/uL Final   Lymphocytes Relative 12/19/2021 31  % Final   Lymphs Abs 12/19/2021 0.8  0.7 - 4.0 K/uL Final   Monocytes Relative 12/19/2021 11  % Final   Monocytes Absolute 12/19/2021 0.3  0.1 - 1.0 K/uL Final   Eosinophils Relative 12/19/2021 4  % Final   Eosinophils Absolute 12/19/2021 0.1  0.0 - 0.5 K/uL Final   Basophils Relative 12/19/2021 2  % Final   Basophils Absolute 12/19/2021 0.0  0.0 - 0.1 K/uL Final   Immature Granulocytes 12/19/2021 0  % Final   Abs Immature Granulocytes 12/19/2021 0.01  0.00 - 0.07 K/uL Final   Performed at [facility], [place] [address] [place], [place] [address]    RADIOGRAPHIC STUDIES: I have personally reviewed the radiological images as listed and agree with the findings in the report  No results found.  ASSESSMENT/PLAN  76 y.o.medical history notable for cervical spondylosis, colon polyps, diverticulitis, GERD, hypertension, coronary artery disease with angioplasty, osteoarthritis/osteoporosis, restless leg syndrome, hyperlipidemia, diabetes mellitus type 2, kidney cancer treated with partial nephrectomy, hysterectomy.  Patient has a long history of anemia  Anemia:  Likely multifactorial with main culprit being CKD due to prior partial nephrectomy.   Since patient is replete with iron will hold on arranging for IV replacement.  B12 and folate stores are adequate.    Anemia of chronic kidney disease:  In CKD patients, EPO levels are inadequately low with respect to the degree of anemia. EPO deficiency starts early in the course of  CKD.  When eGFR falls below 30 ml/min per 1.73 m2 this deficiency becomes more severe.   Some CKD patients have EPO resistance, where normal range EPO levels coexist with low hemoglobin (Hb) levels, indicating that the bone marrow response to endogenous and exogenous EPO is blunted.  Therapy Discussed the benefit and risks (stroke, heart attack, heart failure, blood clots, and death)  of ESA's with patient.   Procrit dosing for CKD patients not on dialysis:  Per package insert.   Consider initiating Aranesp treatment only when the hemoglobin level is  less than 10 g/dL and the  following considerations apply:  o The rate of hemoglobin decline indicates the likelihood of requiring a RBC transfusion and,  o Reducing the risk of alloimmunization and/or other RBC transfusion-related risks is a goal.   If the Hgb exceeds 10 g/dL, reduce or interrupt the dose of Procrit and use the lowest dose of Procrit sufficient to reduce the need for RBC transfusions.    Leukopenia:  Mild and will follow.     Cancer Staging  No matching staging information was found for the patient.   No problem-specific Assessment & Plan notes found for this encounter.   Orders Placed This Encounter  Procedures   CBC with Differential/Platelet    Standing Status:   Future    Standing Expiration Date:   01/17/2023   Ferritin    Standing Status:   Future    Standing Expiration Date:   01/17/2023   CMP ([place] only)    Standing Status:   Future    Standing Expiration Date:   01/17/2023    All questions were answered. The patient knows to call the clinic with any problems, questions or concerns.  This note was electronically signed.    Barbee Cough, MD  01/16/2022 1:03 PM

## 2022-01-16 ENCOUNTER — Encounter: Payer: Self-pay | Admitting: Pharmacist

## 2022-01-16 ENCOUNTER — Inpatient Hospital Stay: Payer: PPO | Attending: Oncology | Admitting: Oncology

## 2022-01-16 ENCOUNTER — Other Ambulatory Visit: Payer: Self-pay | Admitting: Pharmacist

## 2022-01-16 ENCOUNTER — Inpatient Hospital Stay: Payer: PPO

## 2022-01-16 ENCOUNTER — Encounter: Payer: Self-pay | Admitting: Oncology

## 2022-01-16 VITALS — BP 150/66 | HR 56 | Temp 97.7°F | Resp 16 | Ht 60.4 in | Wt 154.7 lb

## 2022-01-16 DIAGNOSIS — D631 Anemia in chronic kidney disease: Secondary | ICD-10-CM

## 2022-01-16 DIAGNOSIS — Z79899 Other long term (current) drug therapy: Secondary | ICD-10-CM | POA: Diagnosis not present

## 2022-01-16 DIAGNOSIS — D72819 Decreased white blood cell count, unspecified: Secondary | ICD-10-CM | POA: Diagnosis not present

## 2022-01-16 DIAGNOSIS — I129 Hypertensive chronic kidney disease with stage 1 through stage 4 chronic kidney disease, or unspecified chronic kidney disease: Secondary | ICD-10-CM | POA: Diagnosis not present

## 2022-01-16 DIAGNOSIS — D500 Iron deficiency anemia secondary to blood loss (chronic): Secondary | ICD-10-CM

## 2022-01-16 DIAGNOSIS — Z85528 Personal history of other malignant neoplasm of kidney: Secondary | ICD-10-CM | POA: Diagnosis not present

## 2022-01-16 DIAGNOSIS — Z905 Acquired absence of kidney: Secondary | ICD-10-CM | POA: Insufficient documentation

## 2022-01-16 DIAGNOSIS — N183 Chronic kidney disease, stage 3 unspecified: Secondary | ICD-10-CM | POA: Diagnosis not present

## 2022-01-16 DIAGNOSIS — E1122 Type 2 diabetes mellitus with diabetic chronic kidney disease: Secondary | ICD-10-CM | POA: Insufficient documentation

## 2022-01-16 DIAGNOSIS — N189 Chronic kidney disease, unspecified: Secondary | ICD-10-CM | POA: Insufficient documentation

## 2022-01-16 HISTORY — DX: Anemia in chronic kidney disease: D63.1

## 2022-01-16 LAB — FERRITIN: Ferritin: 505 ng/mL — ABNORMAL HIGH (ref 11–307)

## 2022-01-16 LAB — CBC WITH DIFFERENTIAL (CANCER CENTER ONLY)
Abs Immature Granulocytes: 0.01 10*3/uL (ref 0.00–0.07)
Basophils Absolute: 0 10*3/uL (ref 0.0–0.1)
Basophils Relative: 1 %
Eosinophils Absolute: 0.1 10*3/uL (ref 0.0–0.5)
Eosinophils Relative: 3 %
HCT: 32.3 % — ABNORMAL LOW (ref 36.0–46.0)
Hemoglobin: 10.3 g/dL — ABNORMAL LOW (ref 12.0–15.0)
Immature Granulocytes: 0 %
Lymphocytes Relative: 24 %
Lymphs Abs: 0.8 10*3/uL (ref 0.7–4.0)
MCH: 29.6 pg (ref 26.0–34.0)
MCHC: 31.9 g/dL (ref 30.0–36.0)
MCV: 92.8 fL (ref 80.0–100.0)
Monocytes Absolute: 0.3 10*3/uL (ref 0.1–1.0)
Monocytes Relative: 8 %
Neutro Abs: 2 10*3/uL (ref 1.7–7.7)
Neutrophils Relative %: 64 %
Platelet Count: 181 10*3/uL (ref 150–400)
RBC: 3.48 MIL/uL — ABNORMAL LOW (ref 3.87–5.11)
RDW: 13.8 % (ref 11.5–15.5)
WBC Count: 3.2 10*3/uL — ABNORMAL LOW (ref 4.0–10.5)
nRBC: 0 % (ref 0.0–0.2)

## 2022-01-17 DIAGNOSIS — I129 Hypertensive chronic kidney disease with stage 1 through stage 4 chronic kidney disease, or unspecified chronic kidney disease: Secondary | ICD-10-CM | POA: Diagnosis not present

## 2022-01-17 DIAGNOSIS — N183 Chronic kidney disease, stage 3 unspecified: Secondary | ICD-10-CM | POA: Diagnosis not present

## 2022-01-20 DIAGNOSIS — R2689 Other abnormalities of gait and mobility: Secondary | ICD-10-CM | POA: Diagnosis not present

## 2022-01-20 DIAGNOSIS — M6281 Muscle weakness (generalized): Secondary | ICD-10-CM | POA: Diagnosis not present

## 2022-01-24 ENCOUNTER — Encounter: Payer: Self-pay | Admitting: Oncology

## 2022-01-24 ENCOUNTER — Inpatient Hospital Stay: Payer: PPO

## 2022-01-24 VITALS — BP 176/69 | HR 63 | Temp 97.7°F | Resp 14 | Ht 60.4 in | Wt 155.0 lb

## 2022-01-24 DIAGNOSIS — N1832 Chronic kidney disease, stage 3b: Secondary | ICD-10-CM

## 2022-01-24 DIAGNOSIS — I129 Hypertensive chronic kidney disease with stage 1 through stage 4 chronic kidney disease, or unspecified chronic kidney disease: Secondary | ICD-10-CM | POA: Diagnosis not present

## 2022-01-24 DIAGNOSIS — D500 Iron deficiency anemia secondary to blood loss (chronic): Secondary | ICD-10-CM

## 2022-01-24 MED ORDER — EPOETIN ALFA-EPBX 20000 UNIT/ML IJ SOLN
20000.0000 [IU] | Freq: Once | INTRAMUSCULAR | Status: AC
  Administered 2022-01-24: 20000 [IU] via SUBCUTANEOUS
  Filled 2022-01-24: qty 1

## 2022-01-24 NOTE — Patient Instructions (Signed)

## 2022-01-29 DIAGNOSIS — Z85528 Personal history of other malignant neoplasm of kidney: Secondary | ICD-10-CM | POA: Insufficient documentation

## 2022-01-29 DIAGNOSIS — M6281 Muscle weakness (generalized): Secondary | ICD-10-CM | POA: Diagnosis not present

## 2022-01-29 DIAGNOSIS — I519 Heart disease, unspecified: Secondary | ICD-10-CM | POA: Insufficient documentation

## 2022-01-29 DIAGNOSIS — I129 Hypertensive chronic kidney disease with stage 1 through stage 4 chronic kidney disease, or unspecified chronic kidney disease: Secondary | ICD-10-CM | POA: Diagnosis not present

## 2022-01-29 DIAGNOSIS — N183 Chronic kidney disease, stage 3 unspecified: Secondary | ICD-10-CM | POA: Diagnosis not present

## 2022-01-29 DIAGNOSIS — R2689 Other abnormalities of gait and mobility: Secondary | ICD-10-CM | POA: Diagnosis not present

## 2022-01-29 NOTE — Progress Notes (Unsigned)
Cardiology Office Note:    Date:  01/30/2022   ID:  Julia Logan, DOB 03-15-1946, MRN [id number]  PCP:  Nicoletta Dress, MD  Cardiologist:  Shirlee More, MD    Referring MD: Nicoletta Dress, MD    ASSESSMENT:    1. Coronary artery disease involving native coronary artery of native heart with angina pectoris ([place])   2. Essential hypertension   3. Mixed hyperlipidemia   4. Stage 3 chronic kidney disease, unspecified whether stage 3a or 3b CKD (HCC)   5. Other iron deficiency anemia    PLAN:    In order of problems listed above:  Fortunately she has broken her cycle of repeated in-stent restenosis and cardiac interventions.  Stable CAD and continue current medical regimen including aspirin high intensity statin beta-blocker and ranolazine that has been remarkably effective. BP at target continue current treatment Continue statin LDL is at target Stable CKD and anemia is improved With her dizziness and falls I will reduce ranolazine to 500 mg twice daily and frequently can cause disequilibrium at high dose   Next appointment: 1 year   Medication Adjustments/Labs and Tests Ordered: Current medicines are reviewed at length with the patient today.  Concerns regarding medicines are outlined above.  No orders of the defined types were placed in this encounter.  No orders of the defined types were placed in this encounter.   Chief Complaint  Patient presents with   Annual Exam    History of Present Illness:    Julia Logan is a 76 y.o. female with a hx of CAD with multiple PCI's for in-stent restenosis of the right coronary artery hypertension hyperlipidemia stage III CKD and iron deficiency anemia last seen 02/04/2021. Compliance with diet, lifestyle and medications: Yes  Overall doing much better she had a problem with falls and vertigo and she is improved with physical therapy She for is much stronger and has had no angina edema shortness of breath palpitation or  syncope She tolerates her statin without muscle pain or weakness Her hemoglobin is improved with erythropoietin and iron Recent labs 12/13/2021 cholesterol 139 LDL 62 A1c 5.7 creatinine 1.46 potassium 4.5 Past Medical History:  Diagnosis Date   Cervical spondylosis without myelopathy    Colon polyps    Diverticulitis of colon (without mention of hemorrhage)(562.11)    Esophageal reflux    Essential hypertension, benign    Heart disease    History of kidney cancer    Iron deficiency anemia, unspecified    Osteoarthrosis, unspecified whether generalized or localized, shoulder region    Osteoporosis    Other and unspecified hyperlipidemia    Restless legs syndrome (RLS)    Type II or unspecified type diabetes mellitus without mention of complication, not stated as uncontrolled     Past Surgical History:  Procedure Laterality Date   APPENDECTOMY  03/18/1975   BUNIONECTOMY     CORONARY BALLOON ANGIOPLASTY N/A 11/09/2017   Procedure: CORONARY BALLOON ANGIOPLASTY;  Surgeon: Martinique, Peter M, MD;  Location: [facility];  Service: Cardiovascular;  Laterality: N/A;   CORONARY STENT INTERVENTION N/A 01/01/2017   Procedure: CORONARY STENT INTERVENTION;  Surgeon: Martinique, Peter M, MD;  Location: [facility];  Service: Cardiovascular;  Laterality: N/A;   KNEE SURGERY     LEFT HEART CATH AND CORONARY ANGIOGRAPHY N/A 01/01/2017   Procedure: LEFT HEART CATH AND CORONARY ANGIOGRAPHY;  Surgeon: Martinique, Peter M, MD;  Location: [facility];  Service: Cardiovascular;  Laterality: N/A;   LEFT HEART CATH AND CORONARY ANGIOGRAPHY N/A 11/09/2017   Procedure: LEFT HEART CATH AND CORONARY ANGIOGRAPHY;  Surgeon: Martinique, Peter M, MD;  Location: [facility];  Service: Cardiovascular;  Laterality: N/A;   PARTIAL NEPHRECTOMY Left 2017   VAGINAL HYSTERECTOMY  03/18/1975    Current Medications: Current Meds  Medication Sig   aspirin 81 MG tablet Take 81 mg by mouth daily.    atorvastatin (LIPITOR) 80 MG tablet TAKE 1 TABLET(80 MG) BY MOUTH DAILY   Calcium Carb-Cholecalciferol (CALCIUM/VITAMIN D PO) Take 1 tablet by mouth daily. 1200-1000 mg-unit   estradiol (ESTRACE) 0.1 MG/GM vaginal cream Place 1 Applicatorful vaginally 3 (three) times a week.   ezetimibe (ZETIA) 10 MG tablet Take 10 mg by mouth daily.    Ferrous Sulfate (IRON) 325 (65 Fe) MG TABS Take 325 mg by mouth every other day.   glimepiride (AMARYL) 4 MG tablet Take 4 mg by mouth daily with breakfast.    metoprolol tartrate (LOPRESSOR) 25 MG tablet Take 0.5 tablets (12.5 mg total) by mouth 2 (two) times daily.   olmesartan (BENICAR) 20 MG tablet Take 20 mg by mouth daily.   pantoprazole (PROTONIX) 40 MG tablet Take 40 mg by mouth daily.   Probiotic Product (PROBIOTIC PO) Take 1 capsule by mouth daily.   ranolazine (RANEXA) 1000 MG SR tablet TAKE 1 TABLET(1000 MG) BY MOUTH TWICE DAILY     Allergies:   Gabapentin, Ticagrelor, and Codeine   Social History   Socioeconomic History   Marital status: Married    Spouse name: Dispensing optician   Number of children: 2   Years of education: 17   Highest education level: Master's degree (e.g., MA, MS, MEng, MEd, MSW, MBA)  Occupational History   Occupation: retired Pharmacist, hospital  Tobacco Use   Smoking status: Never   Smokeless tobacco: Never  Vaping Use   Vaping Use: Never used  Substance and Sexual Activity   Alcohol use: No   Drug use: No   Sexual activity: Not on file  Other Topics Concern   Not on file  Social History Narrative   Lives at home with husband.   Right-handed.   No daily caffeine use.   Social Determinants of Health   Financial Resource Strain: Not on file  Food Insecurity: No Food Insecurity (11/22/2021)   Hunger Vital Sign    Worried About Running Out of Food in the Last Year: Never true    Ran Out of Food in the Last Year: Never true  Transportation Needs: No Transportation Needs (11/22/2021)   PRAPARE - Radiographer, therapeutic (Medical): No    Lack of Transportation (Non-Medical): No  Physical Activity: Not on file  Stress: Not on file  Social Connections: Not on file     Family History: The patient's family history includes Diabetes in her brother, maternal grandfather, and sister; Heart attack in her father; Hypertension in her brother and sister. ROS:   Please see the history of present illness.    All other systems reviewed and are negative.  EKGs/Labs/Other Studies Reviewed:    The following studies were reviewed today:  EKG:  EKG ordered today and personally reviewed.  The ekg ordered today demonstrates sinus rhythm bradycardia 57 bpm otherwise normal EKG  Recent Labs: 11/08/2021: BUN 24; Creatinine, Ser 1.61; Potassium 4.0; Sodium 134 01/16/2022: Hemoglobin 10.3; Platelet Count 181  Recent Lipid Panel    Component Value Date/Time   CHOL 143 10/15/2017 1310  TRIG 106 10/15/2017 1310   HDL 54 10/15/2017 1310   CHOLHDL 2.6 10/15/2017 1310   LDLCALC 68 10/15/2017 1310    Physical Exam:    VS:  BP 130/68 (BP Location: Left Arm, Patient Position: Sitting, Cuff Size: Normal)   Pulse (!) 57   Ht 5' 0.4" (1.534 m)   Wt 154 lb (69.9 kg)   SpO2 97%   BMI 29.68 kg/m     Wt Readings from Last 3 Encounters:  01/30/22 154 lb (69.9 kg)  01/24/22 155 lb 0.6 oz (70.3 kg)  01/16/22 154 lb 11.2 oz (70.2 kg)     GEN:  Well nourished, well developed in no acute distress she has no pallor HEENT: Normal NECK: No JVD; No carotid bruits LYMPHATICS: No lymphadenopathy CARDIAC: RRR, no murmurs, rubs, gallops RESPIRATORY:  Clear to auscultation without rales, wheezing or rhonchi  ABDOMEN: Soft, non-tender, non-distended MUSCULOSKELETAL:  No edema; No deformity  SKIN: Warm and dry NEUROLOGIC:  Alert and oriented x 3 PSYCHIATRIC:  Normal affect    Signed, Shirlee More, MD  01/30/2022 3:48 PM    [facility]

## 2022-01-30 ENCOUNTER — Ambulatory Visit: Payer: PPO | Attending: Cardiology | Admitting: Cardiology

## 2022-01-30 ENCOUNTER — Other Ambulatory Visit: Payer: Self-pay | Admitting: Cardiology

## 2022-01-30 ENCOUNTER — Encounter: Payer: Self-pay | Admitting: Cardiology

## 2022-01-30 VITALS — BP 130/68 | HR 57 | Ht 60.4 in | Wt 154.0 lb

## 2022-01-30 DIAGNOSIS — E782 Mixed hyperlipidemia: Secondary | ICD-10-CM

## 2022-01-30 DIAGNOSIS — I25119 Atherosclerotic heart disease of native coronary artery with unspecified angina pectoris: Secondary | ICD-10-CM

## 2022-01-30 DIAGNOSIS — N183 Chronic kidney disease, stage 3 unspecified: Secondary | ICD-10-CM | POA: Diagnosis not present

## 2022-01-30 DIAGNOSIS — I10 Essential (primary) hypertension: Secondary | ICD-10-CM

## 2022-01-30 DIAGNOSIS — D508 Other iron deficiency anemias: Secondary | ICD-10-CM | POA: Diagnosis not present

## 2022-01-30 MED ORDER — RANOLAZINE ER 500 MG PO TB12: 500.0000 mg | ORAL_TABLET | Freq: Two times a day (BID) | ORAL | 3 refills | Status: DC

## 2022-01-30 NOTE — Patient Instructions (Signed)
Medication Instructions:  Your physician has recommended you make the following change in your medication:   START: Ranolazine 500 mg twice daily  *If you need a refill on your cardiac medications before your next appointment, please call your pharmacy*   Lab Work: None If you have labs (blood work) drawn today and your tests are completely normal, you will receive your results only by: [place] (if you have MyChart) OR A paper copy in the mail If you have any lab test that is abnormal or we need to change your treatment, we will call you to review the results.   Testing/Procedures: None   Follow-Up: At [facility], you and your health needs are our priority.  As part of our continuing mission to provide you with exceptional heart care, we have created designated Provider Care Teams.  These Care Teams include your primary Cardiologist (physician) and Advanced Practice Providers (APPs -  Physician Assistants and Nurse Practitioners) who all work together to provide you with the care you need, when you need it.  We recommend signing up for the patient portal called "MyChart".  Sign up information is provided on this After Visit Summary.  MyChart is used to connect with patients for Virtual Visits (Telemedicine).  Patients are able to view lab/test results, encounter notes, upcoming appointments, etc.  Non-urgent messages can be sent to your provider as well.   To learn more about what you can do with MyChart, go to [email or web address].    Your next appointment:   1 year(s)  The format for your next appointment:   In Person  Provider:   Shirlee More, MD    Other Instructions None  Important Information About Sugar

## 2022-01-30 NOTE — Addendum Note (Signed)
Addended by: Edwyna Shell I on: 01/30/2022 04:02 PM   Modules accepted: Orders

## 2022-01-31 DIAGNOSIS — S32000A Wedge compression fracture of unspecified lumbar vertebra, initial encounter for closed fracture: Secondary | ICD-10-CM | POA: Diagnosis not present

## 2022-01-31 NOTE — Telephone Encounter (Signed)
Refills to pharmacy 

## 2022-02-03 DIAGNOSIS — R2689 Other abnormalities of gait and mobility: Secondary | ICD-10-CM | POA: Diagnosis not present

## 2022-02-03 DIAGNOSIS — M6281 Muscle weakness (generalized): Secondary | ICD-10-CM | POA: Diagnosis not present

## 2022-02-10 DIAGNOSIS — M6281 Muscle weakness (generalized): Secondary | ICD-10-CM | POA: Diagnosis not present

## 2022-02-10 DIAGNOSIS — R2689 Other abnormalities of gait and mobility: Secondary | ICD-10-CM | POA: Diagnosis not present

## 2022-02-17 DIAGNOSIS — M6281 Muscle weakness (generalized): Secondary | ICD-10-CM | POA: Diagnosis not present

## 2022-02-17 DIAGNOSIS — R2689 Other abnormalities of gait and mobility: Secondary | ICD-10-CM | POA: Diagnosis not present

## 2022-02-24 DIAGNOSIS — M6281 Muscle weakness (generalized): Secondary | ICD-10-CM | POA: Diagnosis not present

## 2022-02-24 DIAGNOSIS — R2689 Other abnormalities of gait and mobility: Secondary | ICD-10-CM | POA: Diagnosis not present

## 2022-02-25 DIAGNOSIS — H259 Unspecified age-related cataract: Secondary | ICD-10-CM | POA: Diagnosis not present

## 2022-02-25 DIAGNOSIS — E1136 Type 2 diabetes mellitus with diabetic cataract: Secondary | ICD-10-CM | POA: Diagnosis not present

## 2022-02-25 DIAGNOSIS — Z7984 Long term (current) use of oral hypoglycemic drugs: Secondary | ICD-10-CM | POA: Diagnosis not present

## 2022-02-25 DIAGNOSIS — H2512 Age-related nuclear cataract, left eye: Secondary | ICD-10-CM | POA: Diagnosis not present

## 2022-02-25 DIAGNOSIS — H52223 Regular astigmatism, bilateral: Secondary | ICD-10-CM | POA: Diagnosis not present

## 2022-02-25 DIAGNOSIS — E119 Type 2 diabetes mellitus without complications: Secondary | ICD-10-CM | POA: Diagnosis not present

## 2022-02-25 DIAGNOSIS — I10 Essential (primary) hypertension: Secondary | ICD-10-CM | POA: Diagnosis not present

## 2022-02-25 DIAGNOSIS — H25812 Combined forms of age-related cataract, left eye: Secondary | ICD-10-CM | POA: Diagnosis not present

## 2022-02-26 ENCOUNTER — Inpatient Hospital Stay: Payer: PPO | Attending: Oncology | Admitting: Oncology

## 2022-02-26 ENCOUNTER — Inpatient Hospital Stay: Payer: PPO

## 2022-02-26 VITALS — BP 116/51 | HR 58 | Temp 97.8°F | Resp 16 | Ht 60.4 in | Wt 155.7 lb

## 2022-02-26 DIAGNOSIS — Z79899 Other long term (current) drug therapy: Secondary | ICD-10-CM | POA: Diagnosis not present

## 2022-02-26 DIAGNOSIS — N189 Chronic kidney disease, unspecified: Secondary | ICD-10-CM | POA: Insufficient documentation

## 2022-02-26 DIAGNOSIS — I129 Hypertensive chronic kidney disease with stage 1 through stage 4 chronic kidney disease, or unspecified chronic kidney disease: Secondary | ICD-10-CM | POA: Diagnosis not present

## 2022-02-26 DIAGNOSIS — Z7982 Long term (current) use of aspirin: Secondary | ICD-10-CM | POA: Diagnosis not present

## 2022-02-26 DIAGNOSIS — D649 Anemia, unspecified: Secondary | ICD-10-CM | POA: Diagnosis not present

## 2022-02-26 DIAGNOSIS — D631 Anemia in chronic kidney disease: Secondary | ICD-10-CM | POA: Insufficient documentation

## 2022-02-26 DIAGNOSIS — D500 Iron deficiency anemia secondary to blood loss (chronic): Secondary | ICD-10-CM | POA: Diagnosis not present

## 2022-02-26 DIAGNOSIS — N183 Anemia in chronic kidney disease: Secondary | ICD-10-CM

## 2022-02-26 LAB — CBC WITH DIFFERENTIAL/PLATELET
Abs Immature Granulocytes: 0.01 10*3/uL (ref 0.00–0.07)
Basophils Absolute: 0 10*3/uL (ref 0.0–0.1)
Basophils Relative: 1 %
Eosinophils Absolute: 0.1 10*3/uL (ref 0.0–0.5)
Eosinophils Relative: 4 %
HCT: 33.7 % — ABNORMAL LOW (ref 36.0–46.0)
Hemoglobin: 10.6 g/dL — ABNORMAL LOW (ref 12.0–15.0)
Immature Granulocytes: 0 %
Lymphocytes Relative: 30 %
Lymphs Abs: 0.9 10*3/uL (ref 0.7–4.0)
MCH: 28.7 pg (ref 26.0–34.0)
MCHC: 31.5 g/dL (ref 30.0–36.0)
MCV: 91.3 fL (ref 80.0–100.0)
Monocytes Absolute: 0.3 10*3/uL (ref 0.1–1.0)
Monocytes Relative: 9 %
Neutro Abs: 1.6 10*3/uL — ABNORMAL LOW (ref 1.7–7.7)
Neutrophils Relative %: 56 %
Platelets: 190 10*3/uL (ref 150–400)
RBC: 3.69 MIL/uL — ABNORMAL LOW (ref 3.87–5.11)
RDW: 13.4 % (ref 11.5–15.5)
WBC: 2.9 10*3/uL — ABNORMAL LOW (ref 4.0–10.5)
nRBC: 0 % (ref 0.0–0.2)

## 2022-02-26 LAB — CMP (CANCER CENTER ONLY)
ALT: 13 U/L (ref 0–44)
AST: 20 U/L (ref 15–41)
Albumin: 3.6 g/dL (ref 3.5–5.0)
Alkaline Phosphatase: 65 U/L (ref 38–126)
Anion gap: 7 (ref 5–15)
BUN: 31 mg/dL — ABNORMAL HIGH (ref 8–23)
CO2: 26 mmol/L (ref 22–32)
Calcium: 10 mg/dL (ref 8.9–10.3)
Chloride: 105 mmol/L (ref 98–111)
Creatinine: 1.6 mg/dL — ABNORMAL HIGH (ref 0.44–1.00)
GFR, Estimated: 33 mL/min — ABNORMAL LOW (ref >60)
Glucose, Bld: 194 mg/dL — ABNORMAL HIGH (ref 70–99)
Potassium: 4.1 mmol/L (ref 3.5–5.1)
Sodium: 138 mmol/L (ref 135–145)
Total Bilirubin: 0.7 mg/dL (ref 0.3–1.2)
Total Protein: 6.6 g/dL (ref 6.5–8.1)

## 2022-02-26 LAB — FERRITIN: Ferritin: 418 ng/mL — ABNORMAL HIGH (ref 11–307)

## 2022-02-26 NOTE — Progress Notes (Signed)
[facility] Initial Visit:  Patient Care Team: Nicoletta Dress, MD as PCP - General (Internal Medicine) Pamala Hurry, MD (Urology)  CHIEF COMPLAINTS/PURPOSE OF CONSULTATION:  Oncology History   No history exists.    HISTORY OF PRESENTING ILLNESS: Julia Logan 76 y.o. female is here because of anemia  Medical history notable for cervical spondylosis, colon polyps, diverticulitis, GERD, hypertension, coronary artery disease with angioplasty, osteoarthritis/osteoporosis, restless leg syndrome, hyperlipidemia, diabetes mellitus type 2, kidney cancer treated with partial nephrectomy, hysterectomy  November 08 2019:  EGD and esophageal dilation.  Benign appearing 1.3 cm distal esophageal stricture at the GE junction.  The endoscope passed in the stomach with no resistance.  Stomach had multiple polyps in the fundus and body which were biopsied.  There was no evidence of gastric antral vascular ectatic lesions.  A hiatal hernia was present.  Duodenum was normal.  Antral and fundal biopsies were obtained for H. pylori testing. Colonoscopy normal to terminal ileum.  Pathology from the EGD showed fundic gland polyps, no metaplasia, dysplasia or carcinoma.  H pylori negative.   November 08, 2021: Presented to [facility] emergency room following a fall at home.  She was walking back up her steps at home when she "missed stepped" walking up the steps into her house and fell backwards landing with the back of her head hitting the concrete.  She denied loss of consciousness.   CT head Small amount of subarachnoid hemorrhage at the right frontal pole, over the anterior left temporal lobe and in the left Sylvian fissure.  Posterior scalp laceration without skull fracture.  No acute fracture or static subluxation of the cervical spine.  Patient was managed conservatively and discharged home  December 13, 2021: WBC 3.4 hemoglobin 9.4 MCV 92 white count 155; 6626 lymphs 10  monos 3 eos 1 basophil.  Iron saturation 27% CMP notable for glucose of 114 creatinine 1.46 with calculated GFR of 37 sodium 146 albumin 3.9 total protein 5.8.  Hemoglobin A1c 5.7  December 19 2021:  [facility] Hematology Consult Patient states that she has a 6 year history of anemia.   Takes oral iron only MWF due to constipation.  Has not received IV iron/ required PRBC's once in the past for management of post partum hemorrhage.   Has a normal diet.  No history of hemorrhage postoperatively requiring transfusion.     No hematochezia, melena, hemoptysis, hematuria.  No history of intra-articular or soft tissue bleeding  Does not use NSAIDS for pain.  Is not taking oral anticoagulants  Takes baby ASA daily.   No/has history of abnormal bleeding in family members Patient has symptoms of fatigue, pallor,  DOE, decreased performance status.  Patient has PICA to ice.    WBC 2.7 Hgb 9.4 MCV 94 PLT 167; 52 segs 31 lymphs 11 monos 4 eos 2 basophil's.  Tick 2.4% Coombs test negative haptoglobin 186 Vitamin B12 1064.  Folate 6.2 ferritin 501 SPEP with IEP showed no paraprotein.  Serum free kappa 23 lambda 16.7 with a kappa lambda 1.38 IgG 612 IgA 75 IgM 29  January 16 2022:  Reviewed results of labs with patient.  She is fatigued in the afternoon. Discuss use of ESA which included risks and benefits.   January 24 2022:  Procrit 20 000 units    February 26 2022:  Scheduled follow up for management of anemia.  Had cataract surgery yesterday.  Less fatigued and more energetic since starting procrit.  Review of Systems  Constitutional: Negative.   HENT:   Negative for mouth sores, sore throat and trouble swallowing.   Eyes:  Negative for icterus.       Vision a bit blurry today.  Recovering from cataract surgery  Respiratory:  Negative for chest tightness, cough and shortness of breath.   Cardiovascular:  Negative for chest pain, leg swelling and palpitations.    MEDICAL HISTORY: Past  Medical History:  Diagnosis Date   Cervical spondylosis without myelopathy    Colon polyps    Diverticulitis of colon (without mention of hemorrhage)(562.11)    Esophageal reflux    Essential hypertension, benign    Heart disease    History of kidney cancer    Iron deficiency anemia, unspecified    Osteoarthrosis, unspecified whether generalized or localized, shoulder region    Osteoporosis    Other and unspecified hyperlipidemia    Restless legs syndrome (RLS)    Type II or unspecified type diabetes mellitus without mention of complication, not stated as uncontrolled     SURGICAL HISTORY: Past Surgical History:  Procedure Laterality Date   APPENDECTOMY  03/18/1975   BUNIONECTOMY     CORONARY BALLOON ANGIOPLASTY N/A 11/09/2017   Procedure: CORONARY BALLOON ANGIOPLASTY;  Surgeon: Martinique, Peter M, MD;  Location: [facility];  Service: Cardiovascular;  Laterality: N/A;   CORONARY STENT INTERVENTION N/A 01/01/2017   Procedure: CORONARY STENT INTERVENTION;  Surgeon: Martinique, Peter M, MD;  Location: [place];  Service: Cardiovascular;  Laterality: N/A;   KNEE SURGERY     LEFT HEART CATH AND CORONARY ANGIOGRAPHY N/A 01/01/2017   Procedure: LEFT HEART CATH AND CORONARY ANGIOGRAPHY;  Surgeon: Martinique, Peter M, MD;  Location: [place] [facility];  Service: Cardiovascular;  Laterality: N/A;   LEFT HEART CATH AND CORONARY ANGIOGRAPHY N/A 11/09/2017   Procedure: LEFT HEART CATH AND CORONARY ANGIOGRAPHY;  Surgeon: Martinique, Peter M, MD;  Location: [place] [facility];  Service: Cardiovascular;  Laterality: N/A;   PARTIAL NEPHRECTOMY Left 2017   VAGINAL HYSTERECTOMY  03/18/1975    SOCIAL HISTORY: Social History   Socioeconomic History   Marital status: Married    Spouse name: Dispensing optician   Number of children: 2   Years of education: 99   Highest education level: Master's degree (e.g., MA, MS, MEng, MEd, MSW, MBA)  Occupational History   Occupation: retired Pharmacist, hospital  Tobacco Use    Smoking status: Never   Smokeless tobacco: Never  Vaping Use   Vaping Use: Never used  Substance and Sexual Activity   Alcohol use: No   Drug use: No   Sexual activity: Not on file  Other Topics Concern   Not on file  Social History Narrative   Lives at home with husband.   Right-handed.   No daily caffeine use.   Social Determinants of Health   Financial Resource Strain: Not on file  Food Insecurity: No Food Insecurity (11/22/2021)   Hunger Vital Sign    Worried About Running Out of Food in the Last Year: Never true    Ran Out of Food in the Last Year: Never true  Transportation Needs: No Transportation Needs (11/22/2021)   PRAPARE - Hydrologist (Medical): No    Lack of Transportation (Non-Medical): No  Physical Activity: Not on file  Stress: Not on file  Social Connections: Not on file  Intimate Partner Violence: Not on file    FAMILY HISTORY Family History  Problem Relation  Age of Onset   Diabetes Maternal Grandfather    Heart attack Father    Diabetes Sister    Hypertension Sister    Diabetes Brother    Hypertension Brother     ALLERGIES:  is allergic to gabapentin, ticagrelor, and codeine.  MEDICATIONS:  Current Outpatient Medications  Medication Sig Dispense Refill   aspirin 81 MG tablet Take 81 mg by mouth daily.     atorvastatin (LIPITOR) 80 MG tablet TAKE 1 TABLET(80 MG) BY MOUTH DAILY 90 tablet 3   Calcium Carb-Cholecalciferol (CALCIUM/VITAMIN D PO) Take 1 tablet by mouth daily. 1200-1000 mg-unit     estradiol (ESTRACE) 0.1 MG/GM vaginal cream Place 1 Applicatorful vaginally 3 (three) times a week.     ezetimibe (ZETIA) 10 MG tablet Take 10 mg by mouth daily.   3   Ferrous Sulfate (IRON) 325 (65 Fe) MG TABS Take 325 mg by mouth every other day.     glimepiride (AMARYL) 4 MG tablet Take 4 mg by mouth daily with breakfast.   3   metoprolol tartrate (LOPRESSOR) 25 MG tablet Take 0.5 tablets (12.5 mg total) by mouth 2 (two) times  daily. 90 tablet 3   olmesartan (BENICAR) 20 MG tablet Take 20 mg by mouth daily.     pantoprazole (PROTONIX) 40 MG tablet Take 40 mg by mouth daily.     Probiotic Product (PROBIOTIC PO) Take 1 capsule by mouth daily.     ranolazine (RANEXA) 500 MG 12 hr tablet Take 1 tablet (500 mg total) by mouth 2 (two) times daily. 180 tablet 3   No current facility-administered medications for this visit.    PHYSICAL EXAMINATION:  ECOG PERFORMANCE STATUS: 1 - Symptomatic but completely ambulatory   There were no vitals filed for this visit.   There were no vitals filed for this visit.    Physical Exam Vitals and nursing note reviewed.  Constitutional:      General: She is not in acute distress.    Appearance: Normal appearance. She is normal weight. She is not ill-appearing, toxic-appearing or diaphoretic.     Comments: Here alone.  Comfortable  HENT:     Head: Normocephalic and atraumatic.     Right Ear: External ear normal.     Left Ear: External ear normal.     Nose: Nose normal. No congestion or rhinorrhea.  Eyes:     General: No scleral icterus.    Extraocular Movements: Extraocular movements intact.     Conjunctiva/sclera: Conjunctivae normal.     Pupils: Pupils are equal, round, and reactive to light.  Cardiovascular:     Rate and Rhythm: Normal rate and regular rhythm.     Heart sounds: Normal heart sounds. No murmur heard.    No friction rub. No gallop.  Pulmonary:     Effort: Pulmonary effort is normal. No respiratory distress.     Breath sounds: Normal breath sounds. No stridor. No wheezing, rhonchi or rales.  Chest:     Chest wall: No tenderness.  Abdominal:     General: Bowel sounds are normal. There is no distension.     Palpations: Abdomen is soft. There is no mass.     Tenderness: There is no abdominal tenderness. There is no guarding or rebound.     Hernia: No hernia is present.  Musculoskeletal:        General: No swelling, tenderness or deformity.      Cervical back: Normal range of motion and neck supple. No rigidity or  tenderness.  Lymphadenopathy:     Head:     Right side of head: No submental, submandibular, tonsillar, preauricular, posterior auricular or occipital adenopathy.     Left side of head: No submental, submandibular, tonsillar, preauricular, posterior auricular or occipital adenopathy.     Cervical: No cervical adenopathy.     Right cervical: No superficial, deep or posterior cervical adenopathy.    Left cervical: No superficial, deep or posterior cervical adenopathy.     Upper Body:     Right upper body: No supraclavicular, axillary, pectoral or epitrochlear adenopathy.     Left upper body: No supraclavicular, axillary, pectoral or epitrochlear adenopathy.  Skin:    General: Skin is warm.     Coloration: Skin is not jaundiced.     Findings: No bruising or erythema.     Comments: Less pale  Neurological:     General: No focal deficit present.     Mental Status: She is alert and oriented to person, place, and time. Mental status is at baseline.     Cranial Nerves: No cranial nerve deficit.  Psychiatric:        Mood and Affect: Mood normal.        Behavior: Behavior normal.        Thought Content: Thought content normal.        Judgment: Judgment normal.      LABORATORY DATA: I have personally reviewed the data as listed:  No visits with results within 1 Month(s) from this visit.  Latest known visit with results is:  Office Visit on 01/16/2022  Component Date Value Ref Range Status   Ferritin 01/16/2022 505 (H)  11 - 307 ng/mL Final   Performed at [facility], [place] [address], [place], [place] [address]   WBC Count 01/16/2022 3.2 (L)  4.0 - 10.5 K/uL Final   RBC 01/16/2022 3.48 (L)  3.87 - 5.11 MIL/uL Final   Hemoglobin 01/16/2022 10.3 (L)  12.0 - 15.0 g/dL Final   HCT 01/16/2022 32.3 (L)  36.0 - 46.0 % Final   MCV 01/16/2022 92.8  80.0 - 100.0 fL Final   MCH 01/16/2022 29.6  26.0 - 34.0 pg  Final   MCHC 01/16/2022 31.9  30.0 - 36.0 g/dL Final   RDW 01/16/2022 13.8  11.5 - 15.5 % Final   Platelet Count 01/16/2022 181  150 - 400 K/uL Final   nRBC 01/16/2022 0.0  0.0 - 0.2 % Final   Neutrophils Relative % 01/16/2022 64  % Final   Neutro Abs 01/16/2022 2.0  1.7 - 7.7 K/uL Final   Lymphocytes Relative 01/16/2022 24  % Final   Lymphs Abs 01/16/2022 0.8  0.7 - 4.0 K/uL Final   Monocytes Relative 01/16/2022 8  % Final   Monocytes Absolute 01/16/2022 0.3  0.1 - 1.0 K/uL Final   Eosinophils Relative 01/16/2022 3  % Final   Eosinophils Absolute 01/16/2022 0.1  0.0 - 0.5 K/uL Final   Basophils Relative 01/16/2022 1  % Final   Basophils Absolute 01/16/2022 0.0  0.0 - 0.1 K/uL Final   Immature Granulocytes 01/16/2022 0  % Final   Abs Immature Granulocytes 01/16/2022 0.01  0.00 - 0.07 K/uL Final   Performed at [facility], [place] [address], [place], [place] [address]    RADIOGRAPHIC STUDIES: I have personally reviewed the radiological images as listed and agree with the findings in the report  No results found.  ASSESSMENT/PLAN  76 y.o.medical history notable for cervical  spondylosis, colon polyps, diverticulitis, GERD, hypertension, coronary artery disease with angioplasty, osteoarthritis/osteoporosis, restless leg syndrome, hyperlipidemia, diabetes mellitus type 2, kidney cancer treated with partial nephrectomy, hysterectomy.  Patient has a long history of anemia  Anemia:  Likely multifactorial with main culprit being CKD due to prior partial nephrectomy.   Since patient is replete with iron will hold on arranging for IV replacement.  B12 and folate stores are adequate.  Will check CBC with diff today.  Symptomatically improved since starting procrit.    Anemia of chronic kidney disease:  In CKD patients, EPO levels are inadequately low with respect to the degree of anemia. EPO deficiency starts early in the course of CKD.  When eGFR falls below 30 ml/min per 1.73 m2  this deficiency becomes more severe.   Some CKD patients have EPO resistance, where normal range EPO levels coexist with low hemoglobin (Hb) levels, indicating that the bone marrow response to endogenous and exogenous EPO is blunted.  Therapy Discussed the benefit and risks (stroke, heart attack, heart failure, blood clots, and death)  of ESA's with patient.   Procrit dosing for CKD patients not on dialysis:  Per package insert.   Consider initiating Aranesp treatment only when the hemoglobin level is less than 10 g/dL and the  following considerations apply:  o The rate of hemoglobin decline indicates the likelihood of requiring a RBC transfusion and,  o Reducing the risk of alloimmunization and/or other RBC transfusion-related risks is a goal.   If the Hgb exceeds 10 g/dL, reduce or interrupt the dose of Procrit and use the lowest dose of Procrit sufficient to reduce the need for RBC transfusions.    Leukopenia:  Mild and will follow.     Cancer Staging  No matching staging information was found for the patient.   No problem-specific Assessment & Plan notes found for this encounter.   No orders of the defined types were placed in this encounter.   All questions were answered. The patient knows to call the clinic with any problems, questions or concerns.  20 minutes of face to face time spent with patient  This note was electronically signed.    Barbee Cough, MD  02/26/2022 1:18 PM

## 2022-03-04 DIAGNOSIS — J349 Unspecified disorder of nose and nasal sinuses: Secondary | ICD-10-CM | POA: Diagnosis not present

## 2022-03-04 DIAGNOSIS — R051 Acute cough: Secondary | ICD-10-CM | POA: Diagnosis not present

## 2022-03-07 DIAGNOSIS — R6883 Chills (without fever): Secondary | ICD-10-CM | POA: Diagnosis not present

## 2022-03-07 DIAGNOSIS — U071 COVID-19: Secondary | ICD-10-CM | POA: Diagnosis not present

## 2022-03-07 DIAGNOSIS — Z79899 Other long term (current) drug therapy: Secondary | ICD-10-CM | POA: Diagnosis not present

## 2022-03-07 DIAGNOSIS — Z8709 Personal history of other diseases of the respiratory system: Secondary | ICD-10-CM | POA: Diagnosis not present

## 2022-03-07 DIAGNOSIS — I700 Atherosclerosis of aorta: Secondary | ICD-10-CM | POA: Diagnosis not present

## 2022-03-07 DIAGNOSIS — R197 Diarrhea, unspecified: Secondary | ICD-10-CM | POA: Diagnosis not present

## 2022-03-07 DIAGNOSIS — N3000 Acute cystitis without hematuria: Secondary | ICD-10-CM | POA: Diagnosis not present

## 2022-03-07 DIAGNOSIS — N189 Chronic kidney disease, unspecified: Secondary | ICD-10-CM | POA: Diagnosis not present

## 2022-03-19 DIAGNOSIS — I2510 Atherosclerotic heart disease of native coronary artery without angina pectoris: Secondary | ICD-10-CM | POA: Diagnosis not present

## 2022-03-19 DIAGNOSIS — D509 Iron deficiency anemia, unspecified: Secondary | ICD-10-CM | POA: Diagnosis not present

## 2022-03-19 DIAGNOSIS — Z9861 Coronary angioplasty status: Secondary | ICD-10-CM | POA: Diagnosis not present

## 2022-03-19 DIAGNOSIS — R809 Proteinuria, unspecified: Secondary | ICD-10-CM | POA: Diagnosis not present

## 2022-03-19 DIAGNOSIS — E1129 Type 2 diabetes mellitus with other diabetic kidney complication: Secondary | ICD-10-CM | POA: Diagnosis not present

## 2022-03-19 DIAGNOSIS — I129 Hypertensive chronic kidney disease with stage 1 through stage 4 chronic kidney disease, or unspecified chronic kidney disease: Secondary | ICD-10-CM | POA: Diagnosis not present

## 2022-03-19 DIAGNOSIS — I619 Nontraumatic intracerebral hemorrhage, unspecified: Secondary | ICD-10-CM | POA: Diagnosis not present

## 2022-03-19 DIAGNOSIS — E785 Hyperlipidemia, unspecified: Secondary | ICD-10-CM | POA: Diagnosis not present

## 2022-03-19 DIAGNOSIS — F320 Major depressive disorder, single episode, mild: Secondary | ICD-10-CM | POA: Diagnosis not present

## 2022-03-19 DIAGNOSIS — N183 Chronic kidney disease, stage 3 unspecified: Secondary | ICD-10-CM | POA: Diagnosis not present

## 2022-03-19 DIAGNOSIS — Z1231 Encounter for screening mammogram for malignant neoplasm of breast: Secondary | ICD-10-CM | POA: Diagnosis not present

## 2022-03-26 DIAGNOSIS — M4722 Other spondylosis with radiculopathy, cervical region: Secondary | ICD-10-CM | POA: Diagnosis not present

## 2022-03-26 DIAGNOSIS — M5412 Radiculopathy, cervical region: Secondary | ICD-10-CM | POA: Diagnosis not present

## 2022-03-26 DIAGNOSIS — M542 Cervicalgia: Secondary | ICD-10-CM | POA: Diagnosis not present

## 2022-03-31 DIAGNOSIS — M5412 Radiculopathy, cervical region: Secondary | ICD-10-CM | POA: Diagnosis not present

## 2022-03-31 DIAGNOSIS — M4003 Postural kyphosis, cervicothoracic region: Secondary | ICD-10-CM | POA: Diagnosis not present

## 2022-04-04 DIAGNOSIS — M5412 Radiculopathy, cervical region: Secondary | ICD-10-CM | POA: Diagnosis not present

## 2022-04-04 DIAGNOSIS — M4003 Postural kyphosis, cervicothoracic region: Secondary | ICD-10-CM | POA: Diagnosis not present

## 2022-04-07 DIAGNOSIS — M5412 Radiculopathy, cervical region: Secondary | ICD-10-CM | POA: Diagnosis not present

## 2022-04-07 DIAGNOSIS — M4003 Postural kyphosis, cervicothoracic region: Secondary | ICD-10-CM | POA: Diagnosis not present

## 2022-04-09 ENCOUNTER — Telehealth: Payer: Self-pay | Admitting: Oncology

## 2022-04-09 ENCOUNTER — Inpatient Hospital Stay: Payer: PPO | Attending: Oncology | Admitting: Oncology

## 2022-04-09 ENCOUNTER — Inpatient Hospital Stay: Payer: PPO

## 2022-04-09 VITALS — BP 143/61 | HR 63 | Temp 97.8°F | Resp 16 | Ht 60.4 in | Wt 157.7 lb

## 2022-04-09 DIAGNOSIS — Z85528 Personal history of other malignant neoplasm of kidney: Secondary | ICD-10-CM | POA: Insufficient documentation

## 2022-04-09 DIAGNOSIS — D631 Anemia in chronic kidney disease: Secondary | ICD-10-CM | POA: Insufficient documentation

## 2022-04-09 DIAGNOSIS — E1122 Type 2 diabetes mellitus with diabetic chronic kidney disease: Secondary | ICD-10-CM | POA: Insufficient documentation

## 2022-04-09 DIAGNOSIS — I129 Hypertensive chronic kidney disease with stage 1 through stage 4 chronic kidney disease, or unspecified chronic kidney disease: Secondary | ICD-10-CM | POA: Diagnosis not present

## 2022-04-09 DIAGNOSIS — D649 Anemia, unspecified: Secondary | ICD-10-CM

## 2022-04-09 DIAGNOSIS — D500 Iron deficiency anemia secondary to blood loss (chronic): Secondary | ICD-10-CM

## 2022-04-09 DIAGNOSIS — Z905 Acquired absence of kidney: Secondary | ICD-10-CM

## 2022-04-09 DIAGNOSIS — N189 Chronic kidney disease, unspecified: Secondary | ICD-10-CM | POA: Insufficient documentation

## 2022-04-09 DIAGNOSIS — E785 Hyperlipidemia, unspecified: Secondary | ICD-10-CM | POA: Diagnosis not present

## 2022-04-09 DIAGNOSIS — Z7982 Long term (current) use of aspirin: Secondary | ICD-10-CM | POA: Diagnosis not present

## 2022-04-09 DIAGNOSIS — D72819 Decreased white blood cell count, unspecified: Secondary | ICD-10-CM | POA: Insufficient documentation

## 2022-04-09 DIAGNOSIS — N183 Chronic kidney disease, stage 3 unspecified: Secondary | ICD-10-CM | POA: Diagnosis not present

## 2022-04-09 DIAGNOSIS — Z79899 Other long term (current) drug therapy: Secondary | ICD-10-CM | POA: Diagnosis not present

## 2022-04-09 DIAGNOSIS — Z7984 Long term (current) use of oral hypoglycemic drugs: Secondary | ICD-10-CM | POA: Insufficient documentation

## 2022-04-09 LAB — CBC WITH DIFFERENTIAL/PLATELET
Abs Immature Granulocytes: 0.01 10*3/uL (ref 0.00–0.07)
Basophils Absolute: 0 10*3/uL (ref 0.0–0.1)
Basophils Relative: 1 %
Eosinophils Absolute: 0.1 10*3/uL (ref 0.0–0.5)
Eosinophils Relative: 3 %
HCT: 31 % — ABNORMAL LOW (ref 36.0–46.0)
Hemoglobin: 9.9 g/dL — ABNORMAL LOW (ref 12.0–15.0)
Immature Granulocytes: 0 %
Lymphocytes Relative: 25 %
Lymphs Abs: 0.9 10*3/uL (ref 0.7–4.0)
MCH: 28.5 pg (ref 26.0–34.0)
MCHC: 31.9 g/dL (ref 30.0–36.0)
MCV: 89.3 fL (ref 80.0–100.0)
Monocytes Absolute: 0.3 10*3/uL (ref 0.1–1.0)
Monocytes Relative: 9 %
Neutro Abs: 2.1 10*3/uL (ref 1.7–7.7)
Neutrophils Relative %: 62 %
Platelets: 185 10*3/uL (ref 150–400)
RBC: 3.47 MIL/uL — ABNORMAL LOW (ref 3.87–5.11)
RDW: 14.1 % (ref 11.5–15.5)
WBC: 3.4 10*3/uL — ABNORMAL LOW (ref 4.0–10.5)
nRBC: 0 % (ref 0.0–0.2)

## 2022-04-09 LAB — FERRITIN: Ferritin: 440 ng/mL — ABNORMAL HIGH (ref 11–307)

## 2022-04-09 NOTE — Progress Notes (Signed)
[facility] Follow up Visit:  Patient Care Team: Nicoletta Dress, MD as PCP - General (Internal Medicine) Pamala Hurry, MD (Urology)  CHIEF COMPLAINTS/PURPOSE OF CONSULTATION:  HISTORY OF PRESENTING ILLNESS: Julia Logan 77 y.o. female is here because of anemia  Medical history notable for cervical spondylosis, colon polyps, diverticulitis, GERD, hypertension, coronary artery disease with angioplasty, osteoarthritis/osteoporosis, restless leg syndrome, hyperlipidemia, diabetes mellitus type 2, kidney cancer treated with partial nephrectomy, hysterectomy  November 08 2019:  EGD and esophageal dilation.  Benign appearing 1.3 cm distal esophageal stricture at the GE junction.  The endoscope passed in the stomach with no resistance.  Stomach had multiple polyps in the fundus and body which were biopsied.  There was no evidence of gastric antral vascular ectatic lesions.  A hiatal hernia was present.  Duodenum was normal.  Antral and fundal biopsies were obtained for H. pylori testing. Colonoscopy normal to terminal ileum.  Pathology from the EGD showed fundic gland polyps, no metaplasia, dysplasia or carcinoma.  H pylori negative.   November 08, 2021: Presented to [facility] emergency room following a fall at home.  She was walking back up her steps at home when she "missed stepped" walking up the steps into her house and fell backwards landing with the back of her head hitting the concrete.  She denied loss of consciousness.   CT head Small amount of subarachnoid hemorrhage at the right frontal pole, over the anterior left temporal lobe and in the left Sylvian fissure.  Posterior scalp laceration without skull fracture.  No acute fracture or static subluxation of the cervical spine.  Patient was managed conservatively and discharged home  December 13, 2021: WBC 3.4 hemoglobin 9.4 MCV 92 white count 155; 6626 lymphs 10 monos 3 eos 1 basophil.  Iron saturation  27% CMP notable for glucose of 114 creatinine 1.46 with calculated GFR of 37 sodium 146 albumin 3.9 total protein 5.8.  Hemoglobin A1c 5.7  December 19 2021:  [facility] Hematology Consult Patient states that she has a 6 year history of anemia.   Takes oral iron only MWF due to constipation.  Has not received IV iron/ required PRBC's once in the past for management of post partum hemorrhage.   Has a normal diet.  No history of hemorrhage postoperatively requiring transfusion.     No hematochezia, melena, hemoptysis, hematuria.  No history of intra-articular or soft tissue bleeding  Does not use NSAIDS for pain.  Is not taking oral anticoagulants  Takes baby ASA daily.   No/has history of abnormal bleeding in family members Patient has symptoms of fatigue, pallor,  DOE, decreased performance status.  Patient has PICA to ice.    WBC 2.7 Hgb 9.4 MCV 94 PLT 167; 52 segs 31 lymphs 11 monos 4 eos 2 basophil's.  Tick 2.4% Coombs test negative haptoglobin 186 Vitamin B12 1064.  Folate 6.2 ferritin 501 SPEP with IEP showed no paraprotein.  Serum free kappa 23 lambda 16.7 with a kappa lambda 1.38 IgG 612 IgA 75 IgM 29  January 16 2022:  Reviewed results of labs with patient.  She is fatigued in the afternoon. Discuss use of ESA which included risks and benefits.    January 24 2022:  Procrit 20 000 units   February 26 2022:  Scheduled follow up for management of anemia.  Had cataract surgery yesterday.  Less fatigued and more energetic since starting procrit.   WBC 2.9 hemoglobin 10.6 platelet count 190.  56 segs 30 lymphs 9 monos 4 eos 1 basophil Ferritin 418 CMP notable for glucose 194 creatinine 1.6  April 09, 2022:  Scheduled follow-up for management of anemia.  Reviewed results of labs with patient.  Feels well.    Review of Systems  Constitutional: Negative.   HENT:   Negative for mouth sores, sore throat and trouble swallowing.   Eyes:  Negative for icterus.       Vision a bit blurry  today.  Recovering from cataract surgery  Respiratory:  Negative for chest tightness, cough and shortness of breath.   Cardiovascular:  Negative for chest pain, leg swelling and palpitations.    MEDICAL HISTORY: Past Medical History:  Diagnosis Date   Cervical spondylosis without myelopathy    Colon polyps    Diverticulitis of colon (without mention of hemorrhage)(562.11)    Esophageal reflux    Essential hypertension, benign    Heart disease    History of kidney cancer    Iron deficiency anemia, unspecified    Osteoarthrosis, unspecified whether generalized or localized, shoulder region    Osteoporosis    Other and unspecified hyperlipidemia    Restless legs syndrome (RLS)    Type II or unspecified type diabetes mellitus without mention of complication, not stated as uncontrolled     SURGICAL HISTORY: Past Surgical History:  Procedure Laterality Date   APPENDECTOMY  03/18/1975   BUNIONECTOMY     CORONARY BALLOON ANGIOPLASTY N/A 11/09/2017   Procedure: CORONARY BALLOON ANGIOPLASTY;  Surgeon: Martinique, Peter M, MD;  Location: [facility];  Service: Cardiovascular;  Laterality: N/A;   CORONARY STENT INTERVENTION N/A 01/01/2017   Procedure: CORONARY STENT INTERVENTION;  Surgeon: Martinique, Peter M, MD;  Location: [place] [facility];  Service: Cardiovascular;  Laterality: N/A;   KNEE SURGERY     LEFT HEART CATH AND CORONARY ANGIOGRAPHY N/A 01/01/2017   Procedure: LEFT HEART CATH AND CORONARY ANGIOGRAPHY;  Surgeon: Martinique, Peter M, MD;  Location: [facility];  Service: Cardiovascular;  Laterality: N/A;   LEFT HEART CATH AND CORONARY ANGIOGRAPHY N/A 11/09/2017   Procedure: LEFT HEART CATH AND CORONARY ANGIOGRAPHY;  Surgeon: Martinique, Peter M, MD;  Location: [facility];  Service: Cardiovascular;  Laterality: N/A;   PARTIAL NEPHRECTOMY Left 2017   VAGINAL HYSTERECTOMY  03/18/1975    SOCIAL HISTORY: Social History   Socioeconomic History   Marital status: Married     Spouse name: Dispensing optician   Number of children: 2   Years of education: 56   Highest education level: Master's degree (e.g., MA, MS, MEng, MEd, MSW, MBA)  Occupational History   Occupation: retired Pharmacist, hospital  Tobacco Use   Smoking status: Never   Smokeless tobacco: Never  Vaping Use   Vaping Use: Never used  Substance and Sexual Activity   Alcohol use: No   Drug use: No   Sexual activity: Not on file  Other Topics Concern   Not on file  Social History Narrative   Lives at home with husband.   Right-handed.   No daily caffeine use.   Social Determinants of Health   Financial Resource Strain: Not on file  Food Insecurity: No Food Insecurity (11/22/2021)   Hunger Vital Sign    Worried About Running Out of Food in the Last Year: Never true    Ran Out of Food in the Last Year: Never true  Transportation Needs: No Transportation Needs (11/22/2021)   PRAPARE - Hydrologist (Medical):  No    Lack of Transportation (Non-Medical): No  Physical Activity: Not on file  Stress: Not on file  Social Connections: Not on file  Intimate Partner Violence: Not on file    FAMILY HISTORY Family History  Problem Relation Age of Onset   Diabetes Maternal Grandfather    Heart attack Father    Diabetes Sister    Hypertension Sister    Diabetes Brother    Hypertension Brother     ALLERGIES:  is allergic to gabapentin, ticagrelor, and codeine.  MEDICATIONS:  Current Outpatient Medications  Medication Sig Dispense Refill   aspirin 81 MG tablet Take 81 mg by mouth daily.     atorvastatin (LIPITOR) 80 MG tablet TAKE 1 TABLET(80 MG) BY MOUTH DAILY 90 tablet 3   Calcium Carb-Cholecalciferol (CALCIUM/VITAMIN D PO) Take 1 tablet by mouth daily. 1200-1000 mg-unit     estradiol (ESTRACE) 0.1 MG/GM vaginal cream Place 1 Applicatorful vaginally 3 (three) times a week.     ezetimibe (ZETIA) 10 MG tablet Take 10 mg by mouth daily.   3   Ferrous Sulfate (IRON) 325 (65 Fe) MG TABS  Take 325 mg by mouth every other day.     glimepiride (AMARYL) 4 MG tablet Take 4 mg by mouth daily with breakfast.   3   olmesartan (BENICAR) 20 MG tablet Take 20 mg by mouth daily.     pantoprazole (PROTONIX) 40 MG tablet Take 40 mg by mouth daily.     Probiotic Product (PROBIOTIC PO) Take 1 capsule by mouth daily.     ranolazine (RANEXA) 500 MG 12 hr tablet Take 1 tablet (500 mg total) by mouth 2 (two) times daily. 180 tablet 3   metoprolol tartrate (LOPRESSOR) 25 MG tablet Take 0.5 tablets (12.5 mg total) by mouth 2 (two) times daily. 90 tablet 3   No current facility-administered medications for this visit.    PHYSICAL EXAMINATION:  ECOG PERFORMANCE STATUS: 1 - Symptomatic but completely ambulatory   Vitals:   04/09/22 1410  BP: (!) 143/61  Pulse: 63  Resp: 16  Temp: 97.8 F (36.6 C)  SpO2: 98%     Filed Weights   04/09/22 1410  Weight: 157 lb 11.2 oz (71.5 kg)      Physical Exam Vitals and nursing note reviewed.  Constitutional:      General: She is not in acute distress.    Appearance: Normal appearance. She is normal weight. She is not ill-appearing, toxic-appearing or diaphoretic.     Comments: Here alone.  Comfortable  HENT:     Head: Normocephalic and atraumatic.     Right Ear: External ear normal.     Left Ear: External ear normal.     Nose: Nose normal. No congestion or rhinorrhea.  Eyes:     General: No scleral icterus.    Extraocular Movements: Extraocular movements intact.     Conjunctiva/sclera: Conjunctivae normal.     Pupils: Pupils are equal, round, and reactive to light.  Cardiovascular:     Rate and Rhythm: Normal rate and regular rhythm.     Heart sounds: Normal heart sounds. No murmur heard.    No friction rub. No gallop.  Pulmonary:     Effort: Pulmonary effort is normal. No respiratory distress.     Breath sounds: Normal breath sounds. No stridor. No wheezing, rhonchi or rales.  Chest:     Chest wall: No tenderness.  Abdominal:      General: Bowel sounds are normal. There is no  distension.     Palpations: Abdomen is soft. There is no mass.     Tenderness: There is no abdominal tenderness. There is no guarding or rebound.     Hernia: No hernia is present.  Musculoskeletal:        General: No swelling, tenderness or deformity.     Cervical back: Normal range of motion and neck supple. No rigidity or tenderness.  Lymphadenopathy:     Head:     Right side of head: No submental, submandibular, tonsillar, preauricular, posterior auricular or occipital adenopathy.     Left side of head: No submental, submandibular, tonsillar, preauricular, posterior auricular or occipital adenopathy.     Cervical: No cervical adenopathy.     Right cervical: No superficial, deep or posterior cervical adenopathy.    Left cervical: No superficial, deep or posterior cervical adenopathy.     Upper Body:     Right upper body: No supraclavicular, axillary, pectoral or epitrochlear adenopathy.     Left upper body: No supraclavicular, axillary, pectoral or epitrochlear adenopathy.  Skin:    General: Skin is warm.     Coloration: Skin is not jaundiced.     Findings: No bruising or erythema.     Comments: Less pale  Neurological:     General: No focal deficit present.     Mental Status: She is alert and oriented to person, place, and time. Mental status is at baseline.     Cranial Nerves: No cranial nerve deficit.  Psychiatric:        Mood and Affect: Mood normal.        Behavior: Behavior normal.        Thought Content: Thought content normal.        Judgment: Judgment normal.      LABORATORY DATA: I have personally reviewed the data as listed:  No visits with results within 1 Month(s) from this visit.  Latest known visit with results is:  Appointment on 02/26/2022  Component Date Value Ref Range Status   Sodium 02/26/2022 138  135 - 145 mmol/L Final   Potassium 02/26/2022 4.1  3.5 - 5.1 mmol/L Final   Chloride 02/26/2022 105  98 -  111 mmol/L Final   CO2 02/26/2022 26  22 - 32 mmol/L Final   Glucose, Bld 02/26/2022 194 (H)  70 - 99 mg/dL Final   Glucose reference range applies only to samples taken after fasting for at least 8 hours.   BUN 02/26/2022 31 (H)  8 - 23 mg/dL Final   Creatinine 02/26/2022 1.60 (H)  0.44 - 1.00 mg/dL Final   Calcium 02/26/2022 10.0  8.9 - 10.3 mg/dL Final   Total Protein 02/26/2022 6.6  6.5 - 8.1 g/dL Final   Albumin 02/26/2022 3.6  3.5 - 5.0 g/dL Final   AST 02/26/2022 20  15 - 41 U/L Final   ALT 02/26/2022 13  0 - 44 U/L Final   Alkaline Phosphatase 02/26/2022 65  38 - 126 U/L Final   Total Bilirubin 02/26/2022 0.7  0.3 - 1.2 mg/dL Final   GFR, Estimated 02/26/2022 33 (L)  >60 mL/min Final   Comment: (NOTE) Calculated using the CKD-EPI Creatinine Equation (2021)    Anion gap 02/26/2022 7  5 - 15 Final   Performed at [facility], [place] [address], [place], [place] [address]   Ferritin 02/26/2022 418 (H)  11 - 307 ng/mL Final   Performed at [facility] [address] [place], [place]  [address]   WBC 02/26/2022 2.9 (L)  4.0 - 10.5 K/uL Final   RBC 02/26/2022 3.69 (L)  3.87 - 5.11 MIL/uL Final   Hemoglobin 02/26/2022 10.6 (L)  12.0 - 15.0 g/dL Final   HCT 02/26/2022 33.7 (L)  36.0 - 46.0 % Final   MCV 02/26/2022 91.3  80.0 - 100.0 fL Final   MCH 02/26/2022 28.7  26.0 - 34.0 pg Final   MCHC 02/26/2022 31.5  30.0 - 36.0 g/dL Final   RDW 02/26/2022 13.4  11.5 - 15.5 % Final   Platelets 02/26/2022 190  150 - 400 K/uL Final   nRBC 02/26/2022 0.0  0.0 - 0.2 % Final   Neutrophils Relative % 02/26/2022 56  % Final   Neutro Abs 02/26/2022 1.6 (L)  1.7 - 7.7 K/uL Final   Lymphocytes Relative 02/26/2022 30  % Final   Lymphs Abs 02/26/2022 0.9  0.7 - 4.0 K/uL Final   Monocytes Relative 02/26/2022 9  % Final   Monocytes Absolute 02/26/2022 0.3  0.1 - 1.0 K/uL Final   Eosinophils Relative 02/26/2022 4  % Final   Eosinophils Absolute 02/26/2022 0.1  0.0 -  0.5 K/uL Final   Basophils Relative 02/26/2022 1  % Final   Basophils Absolute 02/26/2022 0.0  0.0 - 0.1 K/uL Final   Immature Granulocytes 02/26/2022 0  % Final   Abs Immature Granulocytes 02/26/2022 0.01  0.00 - 0.07 K/uL Final   Performed at [facility], [place] [address], [place], [place] [address]    RADIOGRAPHIC STUDIES: I have personally reviewed the radiological images as listed and agree with the findings in the report  No results found.  ASSESSMENT/PLAN  77 y.o.medical history notable for cervical spondylosis, colon polyps, diverticulitis, GERD, hypertension, coronary artery disease with angioplasty, osteoarthritis/osteoporosis, restless leg syndrome, hyperlipidemia, diabetes mellitus type 2, kidney cancer treated with partial nephrectomy, hysterectomy.  Patient has a long history of anemia  Anemia:  Multifactorial with main culprit being CKD due to prior partial nephrectomy.   Since patient is replete with iron held on IV replacement.  B12 and folate stores are adequate.  Will check CBC with diff today.   January 25 2020:  Received Procrit April 09 2022:  Will check Hgb today but has not required Procrit since November .    Anemia of chronic kidney disease:  In CKD patients, EPO levels are inadequately low with respect to the degree of anemia. EPO deficiency starts early in the course of CKD.  When eGFR falls below 30 ml/min per 1.73 m2 this deficiency becomes more severe.   Some CKD patients have EPO resistance, where normal range EPO levels coexist with low hemoglobin (Hb) levels, indicating that the bone marrow response to endogenous and exogenous EPO is blunted.  Therapy Discussed the benefit and risks (stroke, heart attack, heart failure, blood clots, and death)  of ESA's with patient.   Procrit dosing for CKD patients not on dialysis:  Per package insert.   Consider initiating Aranesp treatment only when the hemoglobin level is less than 10 g/dL and the   following considerations apply:  o The rate of hemoglobin decline indicates the likelihood of requiring a RBC transfusion and,  o Reducing the risk of alloimmunization and/or other RBC transfusion-related risks is a goal.   If the Hgb exceeds 10 g/dL, reduce or interrupt the dose of Procrit and use the lowest dose of Procrit sufficient to reduce the need for RBC transfusions.    Leukopenia:  Mild and  will follow.     Cancer Staging  No matching staging information was found for the patient.   No problem-specific Assessment & Plan notes found for this encounter.   No orders of the defined types were placed in this encounter.  21  minutes was spent in patient care.  This included time spent preparing to see the patient (e.g., review of tests), obtaining and/or reviewing separately obtained history, counseling and educating the patient, ordering tests; documenting clinical information in the electronic or other health record, independently interpreting results and communicating results to the patient as well as coordination of care All questions were answered. The patient knows to call the clinic with any problems, questions or concerns.  20 minutes of face to face time spent with patient  This note was electronically signed.    Barbee Cough, MD  04/09/2022 2:16 PM

## 2022-04-09 NOTE — Telephone Encounter (Signed)
Patient has been scheduled for follow-up visit per 04/09/22 LOS.  Pt given an appt calendar with date and time.

## 2022-04-11 DIAGNOSIS — M4003 Postural kyphosis, cervicothoracic region: Secondary | ICD-10-CM | POA: Diagnosis not present

## 2022-04-11 DIAGNOSIS — M5412 Radiculopathy, cervical region: Secondary | ICD-10-CM | POA: Diagnosis not present

## 2022-04-14 DIAGNOSIS — M4003 Postural kyphosis, cervicothoracic region: Secondary | ICD-10-CM | POA: Diagnosis not present

## 2022-04-14 DIAGNOSIS — M5412 Radiculopathy, cervical region: Secondary | ICD-10-CM | POA: Diagnosis not present

## 2022-04-18 DIAGNOSIS — M5412 Radiculopathy, cervical region: Secondary | ICD-10-CM | POA: Diagnosis not present

## 2022-04-18 DIAGNOSIS — M4003 Postural kyphosis, cervicothoracic region: Secondary | ICD-10-CM | POA: Diagnosis not present

## 2022-04-21 DIAGNOSIS — M4003 Postural kyphosis, cervicothoracic region: Secondary | ICD-10-CM | POA: Diagnosis not present

## 2022-04-21 DIAGNOSIS — M5412 Radiculopathy, cervical region: Secondary | ICD-10-CM | POA: Diagnosis not present

## 2022-04-23 DIAGNOSIS — M5412 Radiculopathy, cervical region: Secondary | ICD-10-CM | POA: Diagnosis not present

## 2022-04-23 DIAGNOSIS — M4003 Postural kyphosis, cervicothoracic region: Secondary | ICD-10-CM | POA: Diagnosis not present

## 2022-04-29 DIAGNOSIS — M5412 Radiculopathy, cervical region: Secondary | ICD-10-CM | POA: Diagnosis not present

## 2022-04-29 DIAGNOSIS — M4003 Postural kyphosis, cervicothoracic region: Secondary | ICD-10-CM | POA: Diagnosis not present

## 2022-05-02 DIAGNOSIS — M5412 Radiculopathy, cervical region: Secondary | ICD-10-CM | POA: Diagnosis not present

## 2022-05-02 DIAGNOSIS — M4003 Postural kyphosis, cervicothoracic region: Secondary | ICD-10-CM | POA: Diagnosis not present

## 2022-05-05 DIAGNOSIS — M4003 Postural kyphosis, cervicothoracic region: Secondary | ICD-10-CM | POA: Diagnosis not present

## 2022-05-05 DIAGNOSIS — M5412 Radiculopathy, cervical region: Secondary | ICD-10-CM | POA: Diagnosis not present

## 2022-05-09 DIAGNOSIS — M5412 Radiculopathy, cervical region: Secondary | ICD-10-CM | POA: Diagnosis not present

## 2022-05-09 DIAGNOSIS — M4003 Postural kyphosis, cervicothoracic region: Secondary | ICD-10-CM | POA: Diagnosis not present

## 2022-06-06 DIAGNOSIS — F320 Major depressive disorder, single episode, mild: Secondary | ICD-10-CM | POA: Diagnosis not present

## 2022-06-06 DIAGNOSIS — N390 Urinary tract infection, site not specified: Secondary | ICD-10-CM | POA: Diagnosis not present

## 2022-06-09 ENCOUNTER — Encounter: Payer: Self-pay | Admitting: Oncology

## 2022-06-09 ENCOUNTER — Inpatient Hospital Stay: Payer: PPO

## 2022-06-09 ENCOUNTER — Inpatient Hospital Stay: Payer: PPO | Attending: Oncology | Admitting: Oncology

## 2022-06-09 VITALS — BP 125/55 | HR 60 | Temp 97.9°F | Resp 18 | Ht 60.4 in | Wt 158.4 lb

## 2022-06-09 DIAGNOSIS — D649 Anemia, unspecified: Secondary | ICD-10-CM | POA: Diagnosis not present

## 2022-06-09 DIAGNOSIS — Z79899 Other long term (current) drug therapy: Secondary | ICD-10-CM | POA: Diagnosis not present

## 2022-06-09 DIAGNOSIS — Z85528 Personal history of other malignant neoplasm of kidney: Secondary | ICD-10-CM | POA: Diagnosis not present

## 2022-06-09 DIAGNOSIS — N189 Chronic kidney disease, unspecified: Secondary | ICD-10-CM | POA: Diagnosis not present

## 2022-06-09 DIAGNOSIS — I129 Hypertensive chronic kidney disease with stage 1 through stage 4 chronic kidney disease, or unspecified chronic kidney disease: Secondary | ICD-10-CM | POA: Diagnosis not present

## 2022-06-09 DIAGNOSIS — D500 Iron deficiency anemia secondary to blood loss (chronic): Secondary | ICD-10-CM

## 2022-06-09 DIAGNOSIS — D631 Anemia in chronic kidney disease: Secondary | ICD-10-CM | POA: Insufficient documentation

## 2022-06-09 LAB — CBC WITH DIFFERENTIAL/PLATELET
Abs Immature Granulocytes: 0.01 10*3/uL (ref 0.00–0.07)
Basophils Absolute: 0 10*3/uL (ref 0.0–0.1)
Basophils Relative: 1 %
Eosinophils Absolute: 0.1 10*3/uL (ref 0.0–0.5)
Eosinophils Relative: 2 %
HCT: 31.5 % — ABNORMAL LOW (ref 36.0–46.0)
Hemoglobin: 10.1 g/dL — ABNORMAL LOW (ref 12.0–15.0)
Immature Granulocytes: 0 %
Lymphocytes Relative: 23 %
Lymphs Abs: 0.8 10*3/uL (ref 0.7–4.0)
MCH: 28.4 pg (ref 26.0–34.0)
MCHC: 32.1 g/dL (ref 30.0–36.0)
MCV: 88.5 fL (ref 80.0–100.0)
Monocytes Absolute: 0.4 10*3/uL (ref 0.1–1.0)
Monocytes Relative: 10 %
Neutro Abs: 2.4 10*3/uL (ref 1.7–7.7)
Neutrophils Relative %: 64 %
Platelets: 168 10*3/uL (ref 150–400)
RBC: 3.56 MIL/uL — ABNORMAL LOW (ref 3.87–5.11)
RDW: 13 % (ref 11.5–15.5)
WBC: 3.7 10*3/uL — ABNORMAL LOW (ref 4.0–10.5)
nRBC: 0 % (ref 0.0–0.2)

## 2022-06-09 LAB — COMPREHENSIVE METABOLIC PANEL
ALT: 16 U/L (ref 0–44)
AST: 22 U/L (ref 15–41)
Albumin: 3.5 g/dL (ref 3.5–5.0)
Alkaline Phosphatase: 77 U/L (ref 38–126)
Anion gap: 7 (ref 5–15)
BUN: 26 mg/dL — ABNORMAL HIGH (ref 8–23)
CO2: 26 mmol/L (ref 22–32)
Calcium: 9.2 mg/dL (ref 8.9–10.3)
Chloride: 102 mmol/L (ref 98–111)
Creatinine, Ser: 1.47 mg/dL — ABNORMAL HIGH (ref 0.44–1.00)
GFR, Estimated: 37 mL/min — ABNORMAL LOW (ref >60)
Glucose, Bld: 229 mg/dL — ABNORMAL HIGH (ref 70–99)
Potassium: 4.1 mmol/L (ref 3.5–5.1)
Sodium: 135 mmol/L (ref 135–145)
Total Bilirubin: 0.5 mg/dL (ref 0.3–1.2)
Total Protein: 6.3 g/dL — ABNORMAL LOW (ref 6.5–8.1)

## 2022-06-09 LAB — FERRITIN: Ferritin: 427 ng/mL — ABNORMAL HIGH (ref 11–307)

## 2022-06-09 NOTE — Progress Notes (Signed)
[facility] Follow up Visit:  Patient Care Team: Nicoletta Dress, MD as PCP - General (Internal Medicine) Pamala Hurry, MD (Urology)  CHIEF COMPLAINTS/PURPOSE OF CONSULTATION:  HISTORY OF PRESENTING ILLNESS: Julia Logan 77 y.o. female is here because of anemia  Medical history notable for cervical spondylosis, colon polyps, diverticulitis, GERD, hypertension, coronary artery disease with angioplasty, osteoarthritis/osteoporosis, restless leg syndrome, hyperlipidemia, diabetes mellitus type 2, kidney cancer treated with partial nephrectomy, hysterectomy  November 08 2019:  EGD and esophageal dilation.  Benign appearing 1.3 cm distal esophageal stricture at the GE junction.  The endoscope passed in the stomach with no resistance.  Stomach had multiple polyps in the fundus and body which were biopsied.  There was no evidence of gastric antral vascular ectatic lesions.  A hiatal hernia was present.  Duodenum was normal.  Antral and fundal biopsies were obtained for H. pylori testing. Colonoscopy normal to terminal ileum.  Pathology from the EGD showed fundic gland polyps, no metaplasia, dysplasia or carcinoma.  H pylori negative.   November 08, 2021: Presented to [facility] emergency room following a fall at home.  She was walking back up her steps at home when she "missed stepped" walking up the steps into her house and fell backwards landing with the back of her head hitting the concrete.  She denied loss of consciousness.   CT head Small amount of subarachnoid hemorrhage at the right frontal pole, over the anterior left temporal lobe and in the left Sylvian fissure.  Posterior scalp laceration without skull fracture.  No acute fracture or static subluxation of the cervical spine.  Patient was managed conservatively and discharged home  December 13, 2021: WBC 3.4 hemoglobin 9.4 MCV 92 white count 155; 6626 lymphs 10 monos 3 eos 1 basophil.  Iron saturation  27% CMP notable for glucose of 114 creatinine 1.46 with calculated GFR of 37 sodium 146 albumin 3.9 total protein 5.8.  Hemoglobin A1c 5.7  December 19 2021:  [facility] Consult Patient states that she has a 6 year history of anemia.   Takes oral iron only MWF due to constipation.  Has not received IV iron/ required PRBC's once in the past for management of post partum hemorrhage.   Has a normal diet.  No history of hemorrhage postoperatively requiring transfusion.     No hematochezia, melena, hemoptysis, hematuria.  No history of intra-articular or soft tissue bleeding  Does not use NSAIDS for pain.  Is not taking oral anticoagulants  Takes baby ASA daily.   No/has history of abnormal bleeding in family members Patient has symptoms of fatigue, pallor,  DOE, decreased performance status.  Patient has PICA to ice.    WBC 2.7 Hgb 9.4 MCV 94 PLT 167; 52 segs 31 lymphs 11 monos 4 eos 2 basophil's.  Tick 2.4% Coombs test negative haptoglobin 186 Vitamin B12 1064.  Folate 6.2 ferritin 501 SPEP with IEP showed no paraprotein.  Serum free kappa 23 lambda 16.7 with a kappa lambda 1.38 IgG 612 IgA 75 IgM 29  January 16 2022:  Reviewed results of labs with patient.  She is fatigued in the afternoon. Discuss use of ESA which included risks and benefits.    January 24 2022:  Procrit 20 000 units   February 26 2022:  Had cataract surgery yesterday.  Less fatigued and more energetic since starting procrit.   WBC 2.9 hemoglobin 10.6 platelet count 190.  56 segs 30 lymphs 9 monos 4 eos  1 basophil Ferritin 418 CMP notable for glucose 194 creatinine 1.6  April 09, 2022:    Reviewed results of labs with patient.  Feels well.   Ferritin 440 Hgb 9.9  June 09 2022:  Scheduled follow-up for management of anemia. Patient asked how long she needed to come here regarding anemia.  Feels well overall except for depression.  Was recently placed on Effexor.  Taking calcium and iron simultaneously.   Instructed her to take them at different times.  Vision improved since cataract surgery.   PE notable for glucose 229 creatinine 1.47.  Ferritin 427 hemoglobin 10.1  Review of Systems  Constitutional: Negative.   HENT:   Negative for mouth sores, sore throat and trouble swallowing.   Eyes:  Negative for icterus.  Respiratory:  Negative for chest tightness, cough and shortness of breath.   Cardiovascular:  Negative for chest pain, leg swelling and palpitations.  Psychiatric/Behavioral:  Positive for depression. The patient is nervous/anxious.     MEDICAL HISTORY: Past Medical History:  Diagnosis Date   Cervical spondylosis without myelopathy    Colon polyps    Diverticulitis of colon (without mention of hemorrhage)(562.11)    Esophageal reflux    Essential hypertension, benign    Heart disease    History of kidney cancer    Iron deficiency anemia, unspecified    Osteoarthrosis, unspecified whether generalized or localized, shoulder region    Osteoporosis    Other and unspecified hyperlipidemia    Restless legs syndrome (RLS)    Type II or unspecified type diabetes mellitus without mention of complication, not stated as uncontrolled     SURGICAL HISTORY: Past Surgical History:  Procedure Laterality Date   APPENDECTOMY  03/18/1975   BUNIONECTOMY     CORONARY BALLOON ANGIOPLASTY N/A 11/09/2017   Procedure: CORONARY BALLOON ANGIOPLASTY;  Surgeon: Martinique, Peter M, MD;  Location: [place] [facility];  Service: Cardiovascular;  Laterality: N/A;   CORONARY STENT INTERVENTION N/A 01/01/2017   Procedure: CORONARY STENT INTERVENTION;  Surgeon: Martinique, Peter M, MD;  Location: [place] [facility];  Service: Cardiovascular;  Laterality: N/A;   KNEE SURGERY     LEFT HEART CATH AND CORONARY ANGIOGRAPHY N/A 01/01/2017   Procedure: LEFT HEART CATH AND CORONARY ANGIOGRAPHY;  Surgeon: Martinique, Peter M, MD;  Location: [place] [facility];  Service: Cardiovascular;  Laterality: N/A;   LEFT HEART CATH  AND CORONARY ANGIOGRAPHY N/A 11/09/2017   Procedure: LEFT HEART CATH AND CORONARY ANGIOGRAPHY;  Surgeon: Martinique, Peter M, MD;  Location: [place] [facility];  Service: Cardiovascular;  Laterality: N/A;   PARTIAL NEPHRECTOMY Left 2017   VAGINAL HYSTERECTOMY  03/18/1975    SOCIAL HISTORY: Social History   Socioeconomic History   Marital status: Married    Spouse name: Dispensing optician   Number of children: 2   Years of education: 42   Highest education level: Master's degree (e.g., MA, MS, MEng, MEd, MSW, MBA)  Occupational History   Occupation: retired Pharmacist, hospital  Tobacco Use   Smoking status: Never   Smokeless tobacco: Never  Vaping Use   Vaping Use: Never used  Substance and Sexual Activity   Alcohol use: No   Drug use: No   Sexual activity: Not on file  Other Topics Concern   Not on file  Social History Narrative   Lives at home with husband.   Right-handed.   No daily caffeine use.   Social Determinants of Health   Financial Resource Strain: Not on file  Food Insecurity:  No Food Insecurity (11/22/2021)   Hunger Vital Sign    Worried About Running Out of Food in the Last Year: Never true    Ran Out of Food in the Last Year: Never true  Transportation Needs: No Transportation Needs (11/22/2021)   PRAPARE - Hydrologist (Medical): No    Lack of Transportation (Non-Medical): No  Physical Activity: Not on file  Stress: Not on file  Social Connections: Not on file  Intimate Partner Violence: Not on file    FAMILY HISTORY Family History  Problem Relation Age of Onset   Diabetes Maternal Grandfather    Heart attack Father    Diabetes Sister    Hypertension Sister    Diabetes Brother    Hypertension Brother     ALLERGIES:  is allergic to gabapentin, ticagrelor, and codeine.  MEDICATIONS:  Current Outpatient Medications  Medication Sig Dispense Refill   aspirin 81 MG tablet Take 81 mg by mouth daily.     atorvastatin (LIPITOR) 80 MG tablet TAKE 1  TABLET(80 MG) BY MOUTH DAILY 90 tablet 3   Calcium Carb-Cholecalciferol (CALCIUM/VITAMIN D PO) Take 1 tablet by mouth daily. 1200-1000 mg-unit     ciprofloxacin (CIPRO) 250 MG tablet Take 250 mg by mouth 2 (two) times daily.     estradiol (ESTRACE) 0.1 MG/GM vaginal cream Place 1 Applicatorful vaginally 3 (three) times a week.     ezetimibe (ZETIA) 10 MG tablet Take 10 mg by mouth daily.   3   Ferrous Sulfate (IRON) 325 (65 Fe) MG TABS Take 325 mg by mouth every other day.     glimepiride (AMARYL) 4 MG tablet Take 4 mg by mouth daily with breakfast.   3   metoprolol tartrate (LOPRESSOR) 25 MG tablet Take 0.5 tablets (12.5 mg total) by mouth 2 (two) times daily. 90 tablet 3   olmesartan (BENICAR) 20 MG tablet Take 20 mg by mouth daily.     pantoprazole (PROTONIX) 40 MG tablet Take 40 mg by mouth daily.     Probiotic Product (PROBIOTIC PO) Take 1 capsule by mouth daily.     ranolazine (RANEXA) 500 MG 12 hr tablet Take 1 tablet (500 mg total) by mouth 2 (two) times daily. 180 tablet 3   No current facility-administered medications for this visit.    PHYSICAL EXAMINATION:  ECOG PERFORMANCE STATUS: 1 - Symptomatic but completely ambulatory   Vitals:   06/09/22 1502  BP: (!) 125/55  Pulse: 60  Resp: 18  Temp: 97.9 F (36.6 C)  SpO2: 100%     Filed Weights   06/09/22 1502  Weight: 158 lb 6.4 oz (71.8 kg)      Physical Exam Vitals and nursing note reviewed.  Constitutional:      General: She is not in acute distress.    Appearance: Normal appearance. She is normal weight. She is not ill-appearing, toxic-appearing or diaphoretic.     Comments: Here alone.  Comfortable  HENT:     Head: Normocephalic and atraumatic.     Right Ear: External ear normal.     Left Ear: External ear normal.     Nose: Nose normal. No congestion or rhinorrhea.  Eyes:     General: No scleral icterus.    Extraocular Movements: Extraocular movements intact.     Conjunctiva/sclera: Conjunctivae normal.      Pupils: Pupils are equal, round, and reactive to light.  Cardiovascular:     Rate and Rhythm: Normal rate and regular rhythm.  Heart sounds: Normal heart sounds. No murmur heard.    No friction rub. No gallop.  Pulmonary:     Effort: Pulmonary effort is normal. No respiratory distress.     Breath sounds: Normal breath sounds. No stridor. No wheezing, rhonchi or rales.  Chest:     Chest wall: No tenderness.  Abdominal:     General: Bowel sounds are normal. There is no distension.     Palpations: Abdomen is soft. There is no mass.     Tenderness: There is no abdominal tenderness. There is no guarding or rebound.     Hernia: No hernia is present.  Musculoskeletal:        General: No swelling, tenderness or deformity.     Cervical back: Normal range of motion and neck supple. No rigidity or tenderness.  Lymphadenopathy:     Head:     Right side of head: No submental, submandibular, tonsillar, preauricular, posterior auricular or occipital adenopathy.     Left side of head: No submental, submandibular, tonsillar, preauricular, posterior auricular or occipital adenopathy.     Cervical: No cervical adenopathy.     Right cervical: No superficial, deep or posterior cervical adenopathy.    Left cervical: No superficial, deep or posterior cervical adenopathy.     Upper Body:     Right upper body: No supraclavicular, axillary, pectoral or epitrochlear adenopathy.     Left upper body: No supraclavicular, axillary, pectoral or epitrochlear adenopathy.  Skin:    General: Skin is warm.     Coloration: Skin is not jaundiced.     Findings: No bruising or erythema.     Comments: Less pale  Neurological:     General: No focal deficit present.     Mental Status: She is alert and oriented to person, place, and time. Mental status is at baseline.     Cranial Nerves: No cranial nerve deficit.  Psychiatric:        Mood and Affect: Mood normal.        Behavior: Behavior normal.        Thought  Content: Thought content normal.        Judgment: Judgment normal.      LABORATORY DATA: I have personally reviewed the data as listed:  No visits with results within 1 Month(s) from this visit.  Latest known visit with results is:  Office Visit on 04/09/2022  Component Date Value Ref Range Status   WBC 04/09/2022 3.4 (L)  4.0 - 10.5 K/uL Final   RBC 04/09/2022 3.47 (L)  3.87 - 5.11 MIL/uL Final   Hemoglobin 04/09/2022 9.9 (L)  12.0 - 15.0 g/dL Final   HCT 04/09/2022 31.0 (L)  36.0 - 46.0 % Final   MCV 04/09/2022 89.3  80.0 - 100.0 fL Final   MCH 04/09/2022 28.5  26.0 - 34.0 pg Final   MCHC 04/09/2022 31.9  30.0 - 36.0 g/dL Final   RDW 04/09/2022 14.1  11.5 - 15.5 % Final   Platelets 04/09/2022 185  150 - 400 K/uL Final   nRBC 04/09/2022 0.0  0.0 - 0.2 % Final   Neutrophils Relative % 04/09/2022 62  % Final   Neutro Abs 04/09/2022 2.1  1.7 - 7.7 K/uL Final   Lymphocytes Relative 04/09/2022 25  % Final   Lymphs Abs 04/09/2022 0.9  0.7 - 4.0 K/uL Final   Monocytes Relative 04/09/2022 9  % Final   Monocytes Absolute 04/09/2022 0.3  0.1 - 1.0 K/uL Final   Eosinophils Relative  04/09/2022 3  % Final   Eosinophils Absolute 04/09/2022 0.1  0.0 - 0.5 K/uL Final   Basophils Relative 04/09/2022 1  % Final   Basophils Absolute 04/09/2022 0.0  0.0 - 0.1 K/uL Final   Immature Granulocytes 04/09/2022 0  % Final   Abs Immature Granulocytes 04/09/2022 0.01  0.00 - 0.07 K/uL Final   Performed at [facility], [place] [address] [place], [place] [address]    RADIOGRAPHIC STUDIES: I have personally reviewed the radiological images as listed and agree with the findings in the report  No results found.  ASSESSMENT/PLAN  78 y.o.medical history notable for cervical spondylosis, colon polyps, diverticulitis, GERD, hypertension, coronary artery disease with angioplasty, osteoarthritis/osteoporosis, restless leg syndrome, hyperlipidemia, diabetes mellitus type 2, kidney cancer  treated with partial nephrectomy, hysterectomy.  Patient has a long history of anemia  Anemia:  Multifactorial with main culprit being CKD due to prior partial nephrectomy.   Since patient is replete with iron held on IV replacement.  B12 and folate stores are adequate.  Will check CBC with diff today.   January 25 2020:  Received Procrit April 09 2022:  Hgb 9.9 Has not required Procrit since November .   June 09 2022- Hgb 10.1 Cr 1.47 Ferritin 427.  To continue Procrit injections  Anemia of chronic kidney disease:  In CKD patients, EPO levels are inadequately low with respect to the degree of anemia. EPO deficiency starts early in the course of CKD.  When eGFR falls below 30 ml/min per 1.73 m2 this deficiency becomes more severe.   Some CKD patients have EPO resistance, where normal range EPO levels coexist with low hemoglobin (Hb) levels, indicating that the bone marrow response to endogenous and exogenous EPO is blunted.  Therapy Discussed the benefit and risks (stroke, heart attack, heart failure, blood clots, and death)  of ESA's with patient.   Procrit dosing for CKD patients not on dialysis:  Per package insert.   Consider initiating Aranesp treatment only when the hemoglobin level is less than 10 g/dL and the  following considerations apply:  o The rate of hemoglobin decline indicates the likelihood of requiring a RBC transfusion and,  o Reducing the risk of alloimmunization and/or other RBC transfusion-related risks is a goal.   If the Hgb exceeds 10 g/dL, reduce or interrupt the dose of Procrit and use the lowest dose of Procrit sufficient to reduce the need for RBC transfusions.    Leukopenia:  Mild and will follow.     Cancer Staging  No matching staging information was found for the patient.   No problem-specific Assessment & Plan notes found for this encounter.   Orders Placed This Encounter  Procedures   CBC with Differential/Platelet   Comprehensive metabolic panel    Ferritin    Standing Status:   Future    Standing Expiration Date:   06/09/2023   22  minutes was spent in patient care.  This included time spent preparing to see the patient (e.g., review of tests), obtaining and/or reviewing separately obtained history, counseling and educating the patient, ordering tests; documenting clinical information in the electronic or other health record, independently interpreting results and communicating results to the patient as well as coordination of care All questions were answered. The patient knows to call the clinic with any problems, questions or concerns.  20 minutes of face to face time spent with patient  This note was electronically signed.    Barbee Cough, MD  06/09/2022  3:19 PM

## 2022-06-16 ENCOUNTER — Encounter: Payer: Self-pay | Admitting: Oncology

## 2022-06-19 ENCOUNTER — Encounter: Payer: Self-pay | Admitting: Oncology

## 2022-06-23 ENCOUNTER — Inpatient Hospital Stay: Payer: PPO | Attending: Oncology

## 2022-06-23 DIAGNOSIS — Z905 Acquired absence of kidney: Secondary | ICD-10-CM | POA: Diagnosis not present

## 2022-06-23 DIAGNOSIS — Z85528 Personal history of other malignant neoplasm of kidney: Secondary | ICD-10-CM | POA: Insufficient documentation

## 2022-06-23 DIAGNOSIS — I129 Hypertensive chronic kidney disease with stage 1 through stage 4 chronic kidney disease, or unspecified chronic kidney disease: Secondary | ICD-10-CM | POA: Diagnosis not present

## 2022-06-23 DIAGNOSIS — N189 Chronic kidney disease, unspecified: Secondary | ICD-10-CM | POA: Insufficient documentation

## 2022-06-23 DIAGNOSIS — D631 Anemia in chronic kidney disease: Secondary | ICD-10-CM | POA: Insufficient documentation

## 2022-06-23 LAB — CBC WITH DIFFERENTIAL (CANCER CENTER ONLY)
Abs Immature Granulocytes: 0.01 10*3/uL (ref 0.00–0.07)
Basophils Absolute: 0 10*3/uL (ref 0.0–0.1)
Basophils Relative: 1 %
Eosinophils Absolute: 0.1 10*3/uL (ref 0.0–0.5)
Eosinophils Relative: 3 %
HCT: 33.9 % — ABNORMAL LOW (ref 36.0–46.0)
Hemoglobin: 10.8 g/dL — ABNORMAL LOW (ref 12.0–15.0)
Immature Granulocytes: 0 %
Lymphocytes Relative: 24 %
Lymphs Abs: 0.8 10*3/uL (ref 0.7–4.0)
MCH: 28.3 pg (ref 26.0–34.0)
MCHC: 31.9 g/dL (ref 30.0–36.0)
MCV: 88.7 fL (ref 80.0–100.0)
Monocytes Absolute: 0.3 10*3/uL (ref 0.1–1.0)
Monocytes Relative: 9 %
Neutro Abs: 2.2 10*3/uL (ref 1.7–7.7)
Neutrophils Relative %: 63 %
Platelet Count: 158 10*3/uL (ref 150–400)
RBC: 3.82 MIL/uL — ABNORMAL LOW (ref 3.87–5.11)
RDW: 13.1 % (ref 11.5–15.5)
WBC Count: 3.5 10*3/uL — ABNORMAL LOW (ref 4.0–10.5)
nRBC: 0 % (ref 0.0–0.2)

## 2022-06-24 ENCOUNTER — Telehealth: Payer: Self-pay | Admitting: Oncology

## 2022-06-24 DIAGNOSIS — I2510 Atherosclerotic heart disease of native coronary artery without angina pectoris: Secondary | ICD-10-CM | POA: Diagnosis not present

## 2022-06-24 DIAGNOSIS — E785 Hyperlipidemia, unspecified: Secondary | ICD-10-CM | POA: Diagnosis not present

## 2022-06-24 DIAGNOSIS — D509 Iron deficiency anemia, unspecified: Secondary | ICD-10-CM | POA: Diagnosis not present

## 2022-06-24 DIAGNOSIS — R809 Proteinuria, unspecified: Secondary | ICD-10-CM | POA: Diagnosis not present

## 2022-06-24 DIAGNOSIS — E1129 Type 2 diabetes mellitus with other diabetic kidney complication: Secondary | ICD-10-CM | POA: Diagnosis not present

## 2022-06-24 NOTE — Telephone Encounter (Signed)
06/24/22 Spoke with patient and cancelled injection appt on 06/25/22.

## 2022-06-25 ENCOUNTER — Ambulatory Visit: Payer: PPO

## 2022-07-07 ENCOUNTER — Inpatient Hospital Stay: Payer: PPO

## 2022-07-07 DIAGNOSIS — D500 Iron deficiency anemia secondary to blood loss (chronic): Secondary | ICD-10-CM

## 2022-07-07 DIAGNOSIS — I129 Hypertensive chronic kidney disease with stage 1 through stage 4 chronic kidney disease, or unspecified chronic kidney disease: Secondary | ICD-10-CM | POA: Diagnosis not present

## 2022-07-07 LAB — CBC WITH DIFFERENTIAL (CANCER CENTER ONLY)
Abs Immature Granulocytes: 0.01 10*3/uL (ref 0.00–0.07)
Basophils Absolute: 0 10*3/uL (ref 0.0–0.1)
Basophils Relative: 1 %
Eosinophils Absolute: 0.2 10*3/uL (ref 0.0–0.5)
Eosinophils Relative: 4 %
HCT: 33.5 % — ABNORMAL LOW (ref 36.0–46.0)
Hemoglobin: 10.8 g/dL — ABNORMAL LOW (ref 12.0–15.0)
Immature Granulocytes: 0 %
Lymphocytes Relative: 24 %
Lymphs Abs: 0.9 10*3/uL (ref 0.7–4.0)
MCH: 28.6 pg (ref 26.0–34.0)
MCHC: 32.2 g/dL (ref 30.0–36.0)
MCV: 88.6 fL (ref 80.0–100.0)
Monocytes Absolute: 0.4 10*3/uL (ref 0.1–1.0)
Monocytes Relative: 9 %
Neutro Abs: 2.4 10*3/uL (ref 1.7–7.7)
Neutrophils Relative %: 62 %
Platelet Count: 164 10*3/uL (ref 150–400)
RBC: 3.78 MIL/uL — ABNORMAL LOW (ref 3.87–5.11)
RDW: 13 % (ref 11.5–15.5)
WBC Count: 3.9 10*3/uL — ABNORMAL LOW (ref 4.0–10.5)
nRBC: 0 % (ref 0.0–0.2)

## 2022-07-08 ENCOUNTER — Telehealth: Payer: Self-pay

## 2022-07-08 NOTE — Telephone Encounter (Signed)
CALLED PATIENT TO INFORM HER THAT HER HGB = 10.8 AND THAT WE WERE CANCELLING HER SHOT APPOINTMENT. THE PATIENT VERBALIZED UNDERSTANDING.

## 2022-07-09 ENCOUNTER — Ambulatory Visit: Payer: PPO

## 2022-07-17 ENCOUNTER — Encounter: Payer: Self-pay | Admitting: Oncology

## 2022-07-21 ENCOUNTER — Inpatient Hospital Stay: Payer: PPO | Attending: Oncology

## 2022-07-21 DIAGNOSIS — D631 Anemia in chronic kidney disease: Secondary | ICD-10-CM | POA: Insufficient documentation

## 2022-07-21 DIAGNOSIS — I129 Hypertensive chronic kidney disease with stage 1 through stage 4 chronic kidney disease, or unspecified chronic kidney disease: Secondary | ICD-10-CM | POA: Diagnosis not present

## 2022-07-21 DIAGNOSIS — N189 Chronic kidney disease, unspecified: Secondary | ICD-10-CM | POA: Diagnosis not present

## 2022-07-21 LAB — CBC WITH DIFFERENTIAL (CANCER CENTER ONLY)
Abs Immature Granulocytes: 0.01 10*3/uL (ref 0.00–0.07)
Basophils Absolute: 0 10*3/uL (ref 0.0–0.1)
Basophils Relative: 1 %
Eosinophils Absolute: 0.2 10*3/uL (ref 0.0–0.5)
Eosinophils Relative: 4 %
HCT: 36.5 % (ref 36.0–46.0)
Hemoglobin: 11.3 g/dL — ABNORMAL LOW (ref 12.0–15.0)
Immature Granulocytes: 0 %
Lymphocytes Relative: 28 %
Lymphs Abs: 1.1 10*3/uL (ref 0.7–4.0)
MCH: 27.2 pg (ref 26.0–34.0)
MCHC: 31 g/dL (ref 30.0–36.0)
MCV: 88 fL (ref 80.0–100.0)
Monocytes Absolute: 0.3 10*3/uL (ref 0.1–1.0)
Monocytes Relative: 8 %
Neutro Abs: 2.4 10*3/uL (ref 1.7–7.7)
Neutrophils Relative %: 59 %
Platelet Count: 187 10*3/uL (ref 150–400)
RBC: 4.15 MIL/uL (ref 3.87–5.11)
RDW: 13.1 % (ref 11.5–15.5)
WBC Count: 4.1 10*3/uL (ref 4.0–10.5)
nRBC: 0 % (ref 0.0–0.2)

## 2022-07-23 ENCOUNTER — Ambulatory Visit: Payer: PPO

## 2022-08-04 ENCOUNTER — Inpatient Hospital Stay: Payer: PPO

## 2022-08-04 DIAGNOSIS — I129 Hypertensive chronic kidney disease with stage 1 through stage 4 chronic kidney disease, or unspecified chronic kidney disease: Secondary | ICD-10-CM | POA: Diagnosis not present

## 2022-08-04 LAB — CBC WITH DIFFERENTIAL (CANCER CENTER ONLY)
Abs Immature Granulocytes: 0 10*3/uL (ref 0.00–0.07)
Basophils Absolute: 0 10*3/uL (ref 0.0–0.1)
Basophils Relative: 1 %
Eosinophils Absolute: 0.2 10*3/uL (ref 0.0–0.5)
Eosinophils Relative: 4 %
HCT: 36.6 % (ref 36.0–46.0)
Hemoglobin: 11.6 g/dL — ABNORMAL LOW (ref 12.0–15.0)
Immature Granulocytes: 0 %
Lymphocytes Relative: 23 %
Lymphs Abs: 1 10*3/uL (ref 0.7–4.0)
MCH: 27.8 pg (ref 26.0–34.0)
MCHC: 31.7 g/dL (ref 30.0–36.0)
MCV: 87.8 fL (ref 80.0–100.0)
Monocytes Absolute: 0.3 10*3/uL (ref 0.1–1.0)
Monocytes Relative: 8 %
Neutro Abs: 2.9 10*3/uL (ref 1.7–7.7)
Neutrophils Relative %: 64 %
Platelet Count: 205 10*3/uL (ref 150–400)
RBC: 4.17 MIL/uL (ref 3.87–5.11)
RDW: 13.2 % (ref 11.5–15.5)
WBC Count: 4.4 10*3/uL (ref 4.0–10.5)
nRBC: 0 % (ref 0.0–0.2)

## 2022-08-06 ENCOUNTER — Ambulatory Visit: Payer: PPO

## 2022-08-15 ENCOUNTER — Other Ambulatory Visit: Payer: Self-pay

## 2022-08-15 DIAGNOSIS — D500 Iron deficiency anemia secondary to blood loss (chronic): Secondary | ICD-10-CM

## 2022-08-18 ENCOUNTER — Inpatient Hospital Stay: Payer: PPO | Attending: Oncology

## 2022-08-18 DIAGNOSIS — N189 Chronic kidney disease, unspecified: Secondary | ICD-10-CM | POA: Insufficient documentation

## 2022-08-18 DIAGNOSIS — D631 Anemia in chronic kidney disease: Secondary | ICD-10-CM | POA: Diagnosis not present

## 2022-08-18 DIAGNOSIS — D500 Iron deficiency anemia secondary to blood loss (chronic): Secondary | ICD-10-CM

## 2022-08-18 LAB — CBC WITH DIFFERENTIAL (CANCER CENTER ONLY)
Abs Immature Granulocytes: 0.02 10*3/uL (ref 0.00–0.07)
Basophils Absolute: 0 10*3/uL (ref 0.0–0.1)
Basophils Relative: 1 %
Eosinophils Absolute: 0.3 10*3/uL (ref 0.0–0.5)
Eosinophils Relative: 6 %
HCT: 33.3 % — ABNORMAL LOW (ref 36.0–46.0)
Hemoglobin: 10.8 g/dL — ABNORMAL LOW (ref 12.0–15.0)
Immature Granulocytes: 0 %
Lymphocytes Relative: 26 %
Lymphs Abs: 1.2 10*3/uL (ref 0.7–4.0)
MCH: 28.3 pg (ref 26.0–34.0)
MCHC: 32.4 g/dL (ref 30.0–36.0)
MCV: 87.4 fL (ref 80.0–100.0)
Monocytes Absolute: 0.4 10*3/uL (ref 0.1–1.0)
Monocytes Relative: 8 %
Neutro Abs: 2.7 10*3/uL (ref 1.7–7.7)
Neutrophils Relative %: 59 %
Platelet Count: 184 10*3/uL (ref 150–400)
RBC: 3.81 MIL/uL — ABNORMAL LOW (ref 3.87–5.11)
RDW: 13.4 % (ref 11.5–15.5)
WBC Count: 4.6 10*3/uL (ref 4.0–10.5)
nRBC: 0 % (ref 0.0–0.2)

## 2022-08-19 ENCOUNTER — Encounter: Payer: Self-pay | Admitting: Oncology

## 2022-08-20 ENCOUNTER — Ambulatory Visit: Payer: PPO

## 2022-09-01 ENCOUNTER — Inpatient Hospital Stay: Payer: PPO

## 2022-09-01 DIAGNOSIS — N189 Chronic kidney disease, unspecified: Secondary | ICD-10-CM | POA: Diagnosis not present

## 2022-09-01 LAB — CBC WITH DIFFERENTIAL (CANCER CENTER ONLY)
Abs Immature Granulocytes: 0.01 K/uL (ref 0.00–0.07)
Basophils Absolute: 0 K/uL (ref 0.0–0.1)
Basophils Relative: 1 %
Eosinophils Absolute: 0.3 K/uL (ref 0.0–0.5)
Eosinophils Relative: 8 %
HCT: 33.2 % — ABNORMAL LOW (ref 36.0–46.0)
Hemoglobin: 10.6 g/dL — ABNORMAL LOW (ref 12.0–15.0)
Immature Granulocytes: 0 %
Lymphocytes Relative: 24 %
Lymphs Abs: 1 K/uL (ref 0.7–4.0)
MCH: 28.3 pg (ref 26.0–34.0)
MCHC: 31.9 g/dL (ref 30.0–36.0)
MCV: 88.5 fL (ref 80.0–100.0)
Monocytes Absolute: 0.4 K/uL (ref 0.1–1.0)
Monocytes Relative: 10 %
Neutro Abs: 2.4 K/uL (ref 1.7–7.7)
Neutrophils Relative %: 57 %
Platelet Count: 186 K/uL (ref 150–400)
RBC: 3.75 MIL/uL — ABNORMAL LOW (ref 3.87–5.11)
RDW: 14 % (ref 11.5–15.5)
WBC Count: 4.2 K/uL (ref 4.0–10.5)
nRBC: 0 % (ref 0.0–0.2)

## 2022-09-03 ENCOUNTER — Ambulatory Visit: Payer: PPO

## 2022-09-09 ENCOUNTER — Ambulatory Visit: Payer: PPO | Admitting: Oncology

## 2022-09-09 ENCOUNTER — Other Ambulatory Visit: Payer: PPO

## 2022-09-22 DIAGNOSIS — E119 Type 2 diabetes mellitus without complications: Secondary | ICD-10-CM | POA: Diagnosis not present

## 2022-09-22 DIAGNOSIS — H52223 Regular astigmatism, bilateral: Secondary | ICD-10-CM | POA: Diagnosis not present

## 2022-09-22 DIAGNOSIS — Z7984 Long term (current) use of oral hypoglycemic drugs: Secondary | ICD-10-CM | POA: Diagnosis not present

## 2022-09-22 DIAGNOSIS — Z9849 Cataract extraction status, unspecified eye: Secondary | ICD-10-CM | POA: Diagnosis not present

## 2022-09-22 DIAGNOSIS — H524 Presbyopia: Secondary | ICD-10-CM | POA: Diagnosis not present

## 2022-09-22 DIAGNOSIS — Z961 Presence of intraocular lens: Secondary | ICD-10-CM | POA: Diagnosis not present

## 2022-09-22 DIAGNOSIS — H5213 Myopia, bilateral: Secondary | ICD-10-CM | POA: Diagnosis not present

## 2022-09-22 NOTE — Progress Notes (Signed)
[facility] Cancer Follow up Visit:  Patient Care Team: Paulina Fusi, MD as PCP - General (Internal Medicine) Joline Maxcy, MD (Urology)  CHIEF COMPLAINTS/PURPOSE OF CONSULTATION:  HISTORY OF PRESENTING ILLNESS: Julia Logan 77 y.o. female is here because of anemia  Medical history notable for cervical spondylosis, colon polyps, diverticulitis, GERD, hypertension, coronary artery disease with angioplasty, osteoarthritis/osteoporosis, restless leg syndrome, hyperlipidemia, diabetes mellitus type 2, kidney cancer treated with partial nephrectomy, hysterectomy  November 08 2019:  EGD and esophageal dilation.  Benign appearing 1.3 cm distal esophageal stricture at the GE junction.  The endoscope passed in the stomach with no resistance.  Stomach had multiple polyps in the fundus and body which were biopsied.  There was no evidence of gastric antral vascular ectatic lesions.  A hiatal hernia was present.  Duodenum was normal.  Antral and fundal biopsies were obtained for H. pylori testing. Colonoscopy normal to terminal ileum.  Pathology from the EGD showed fundic gland polyps, no metaplasia, dysplasia or carcinoma.  H pylori negative.   November 08, 2021: Presented to [facility] emergency room following a fall at home.  She was walking back up her steps at home when she "missed stepped" walking up the steps into her house and fell backwards landing with the back of her head hitting the concrete.  She denied loss of consciousness.   CT head Small amount of subarachnoid hemorrhage at the right frontal pole, over the anterior left temporal lobe and in the left Sylvian fissure.  Posterior scalp laceration without skull fracture.  No acute fracture or static subluxation of the cervical spine.  Patient was managed conservatively and discharged home  December 13, 2021: WBC 3.4 hemoglobin 9.4 MCV 92 white count 155; 6626 lymphs 10 monos 3 eos 1 basophil.  Iron saturation  27% CMP notable for glucose of 114 creatinine 1.46 with calculated GFR of 37 sodium 146 albumin 3.9 total protein 5.8.  Hemoglobin A1c 5.7  December 19 2021:  [facility] Hematology Consult Patient states that she has a 6 year history of anemia.   Takes oral iron only MWF due to constipation.  Has not received IV iron/ required PRBC's once in the past for management of post partum hemorrhage.   Has a normal diet.  No history of hemorrhage postoperatively requiring transfusion.     No hematochezia, melena, hemoptysis, hematuria.  No history of intra-articular or soft tissue bleeding  Does not use NSAIDS for pain.  Is not taking oral anticoagulants  Takes baby ASA daily.   No/has history of abnormal bleeding in family members Patient has symptoms of fatigue, pallor,  DOE, decreased performance status.  Patient has PICA to ice.    WBC 2.7 Hgb 9.4 MCV 94 PLT 167; 52 segs 31 lymphs 11 monos 4 eos 2 basophil's.  Tick 2.4% Coombs test negative haptoglobin 186 Vitamin B12 1064.  Folate 6.2 ferritin 501 SPEP with IEP showed no paraprotein.  Serum free kappa 23 lambda 16.7 with a kappa lambda 1.38 IgG 612 IgA 75 IgM 29  January 16 2022:  Reviewed results of labs with patient.  She is fatigued in the afternoon. Discuss use of ESA which included risks and benefits.    January 24 2022:  Procrit 20 000 units   February 26 2022:  Had cataract surgery yesterday.  Less fatigued and more energetic since starting procrit.   WBC 2.9 hemoglobin 10.6 platelet count 190.  56 segs 30 lymphs 9 monos 4 eos  1 basophil Ferritin 418 CMP notable for glucose 194 creatinine 1.6  April 09, 2022:    Reviewed results of labs with patient.  Feels well.   Ferritin 440 Hgb 9.9  June 09 2022:   Patient asked how long she needed to come here regarding anemia.  Feels well overall except for depression.  Was recently placed on Effexor.  Taking calcium and iron simultaneously.  Instructed her to take them at different times.   Vision improved since cataract surgery.   Chemistries notable for glucose 229 creatinine 1.47.  Ferritin 427 hemoglobin 10.1  September 24 2022:   Scheduled follow-up for management of anemia.  Not feeling well today because sugars have been "out of wack" due to alteration in eating habits.  Mood better on Effexor but it has increased her appetite.  Has gained only 2 lbs since last visit.  Taking iron sulfate 325 mg at noon but occasionally misses doses.  Has not received Procrit since November 2023.   Hemoglobin 10.5 Ferritin 429 folate 11.6 B12 1316 Creatinine 1.3  Review of Systems  Constitutional: Negative.   HENT:   Negative for mouth sores, sore throat and trouble swallowing.   Eyes:  Negative for icterus.  Respiratory:  Negative for chest tightness, cough and shortness of breath.   Cardiovascular:  Negative for chest pain, leg swelling and palpitations.  Psychiatric/Behavioral:  Positive for depression. The patient is nervous/anxious.     MEDICAL HISTORY: Past Medical History:  Diagnosis Date   Cervical spondylosis without myelopathy    Colon polyps    Diverticulitis of colon (without mention of hemorrhage)(562.11)    Esophageal reflux    Essential hypertension, benign    Heart disease    History of kidney cancer    Iron deficiency anemia, unspecified    Osteoarthrosis, unspecified whether generalized or localized, shoulder region    Osteoporosis    Other and unspecified hyperlipidemia    Restless legs syndrome (RLS)    Type II or unspecified type diabetes mellitus without mention of complication, not stated as uncontrolled     SURGICAL HISTORY: Past Surgical History:  Procedure Laterality Date   APPENDECTOMY  03/18/1975   BUNIONECTOMY     CORONARY BALLOON ANGIOPLASTY N/A 11/09/2017   Procedure: CORONARY BALLOON ANGIOPLASTY;  Surgeon: Swaziland, Peter M, MD;  Location: [facility] INVASIVE CV LAB;  Service: Cardiovascular;  Laterality: N/A;   CORONARY STENT INTERVENTION N/A  01/01/2017   Procedure: CORONARY STENT INTERVENTION;  Surgeon: Swaziland, Peter M, MD;  Location: [facility] INVASIVE CV LAB;  Service: Cardiovascular;  Laterality: N/A;   KNEE SURGERY     LEFT HEART CATH AND CORONARY ANGIOGRAPHY N/A 01/01/2017   Procedure: LEFT HEART CATH AND CORONARY ANGIOGRAPHY;  Surgeon: Swaziland, Peter M, MD;  Location: [facility] INVASIVE CV LAB;  Service: Cardiovascular;  Laterality: N/A;   LEFT HEART CATH AND CORONARY ANGIOGRAPHY N/A 11/09/2017   Procedure: LEFT HEART CATH AND CORONARY ANGIOGRAPHY;  Surgeon: Swaziland, Peter M, MD;  Location: [facility] INVASIVE CV LAB;  Service: Cardiovascular;  Laterality: N/A;   PARTIAL NEPHRECTOMY Left 2017   VAGINAL HYSTERECTOMY  03/18/1975    SOCIAL HISTORY: Social History   Socioeconomic History   Marital status: Married    Spouse name: Warden/ranger   Number of children: 2   Years of education: 17   Highest education level: Master's degree (e.g., MA, MS, MEng, MEd, MSW, MBA)  Occupational History   Occupation: retired Runner, broadcasting/film/video  Tobacco Use   Smoking status: Never   Smokeless tobacco: Never  Vaping Use   Vaping Use: Never used  Substance and Sexual Activity   Alcohol use: No   Drug use: No   Sexual activity: Not on file  Other Topics Concern   Not on file  Social History Narrative   Lives at home with husband.   Right-handed.   No daily caffeine use.   Social Determinants of Health   Financial Resource Strain: Not on file  Food Insecurity: No Food Insecurity (11/22/2021)   Hunger Vital Sign    Worried About Running Out of Food in the Last Year: Never true    Ran Out of Food in the Last Year: Never true  Transportation Needs: No Transportation Needs (11/22/2021)   PRAPARE - Administrator, Civil Service (Medical): No    Lack of Transportation (Non-Medical): No  Physical Activity: Not on file  Stress: Not on file  Social Connections: Not on file  Intimate Partner Violence: Not on file    FAMILY HISTORY Family History  Problem  Relation Age of Onset   Diabetes Maternal Grandfather    Heart attack Father    Diabetes Sister    Hypertension Sister    Diabetes Brother    Hypertension Brother     ALLERGIES:  is allergic to gabapentin, ticagrelor, and codeine.  MEDICATIONS:  Current Outpatient Medications  Medication Sig Dispense Refill   aspirin 81 MG tablet Take 81 mg by mouth daily.     atorvastatin (LIPITOR) 80 MG tablet TAKE 1 TABLET(80 MG) BY MOUTH DAILY 90 tablet 3   Calcium Carb-Cholecalciferol (CALCIUM/VITAMIN D PO) Take 1 tablet by mouth daily. 1200-1000 mg-unit     ciprofloxacin (CIPRO) 250 MG tablet Take 250 mg by mouth 2 (two) times daily.     estradiol (ESTRACE) 0.1 MG/GM vaginal cream Place 1 Applicatorful vaginally 3 (three) times a week.     ezetimibe (ZETIA) 10 MG tablet Take 10 mg by mouth daily.   3   Ferrous Sulfate (IRON) 325 (65 Fe) MG TABS Take 325 mg by mouth every other day.     glimepiride (AMARYL) 4 MG tablet Take 4 mg by mouth daily with breakfast.   3   metoprolol tartrate (LOPRESSOR) 25 MG tablet Take 0.5 tablets (12.5 mg total) by mouth 2 (two) times daily. 90 tablet 3   olmesartan (BENICAR) 20 MG tablet Take 20 mg by mouth daily.     pantoprazole (PROTONIX) 40 MG tablet Take 40 mg by mouth daily.     Probiotic Product (PROBIOTIC PO) Take 1 capsule by mouth daily.     ranolazine (RANEXA) 500 MG 12 hr tablet Take 1 tablet (500 mg total) by mouth 2 (two) times daily. 180 tablet 3   No current facility-administered medications for this visit.    PHYSICAL EXAMINATION:  ECOG PERFORMANCE STATUS: 1 - Symptomatic but completely ambulatory   There were no vitals filed for this visit.    There were no vitals filed for this visit.     Physical Exam Vitals and nursing note reviewed.  Constitutional:      General: She is not in acute distress.    Appearance: Normal appearance. She is normal weight. She is not ill-appearing, toxic-appearing or diaphoretic.     Comments: Here  alone.  Comfortable  HENT:     Head: Normocephalic and atraumatic.     Right Ear: External ear normal.     Left Ear: External ear normal.     Nose: Nose normal. No congestion or rhinorrhea.  Eyes:     General: No scleral icterus.    Extraocular Movements: Extraocular movements intact.     Conjunctiva/sclera: Conjunctivae normal.     Pupils: Pupils are equal, round, and reactive to light.  Cardiovascular:     Rate and Rhythm: Normal rate and regular rhythm.     Heart sounds: Normal heart sounds. No murmur heard.    No friction rub. No gallop.  Pulmonary:     Effort: Pulmonary effort is normal. No respiratory distress.     Breath sounds: Normal breath sounds. No stridor. No wheezing, rhonchi or rales.  Chest:     Chest wall: No tenderness.  Abdominal:     General: Bowel sounds are normal. There is no distension.     Palpations: Abdomen is soft. There is no mass.     Tenderness: There is no abdominal tenderness. There is no guarding or rebound.     Hernia: No hernia is present.  Musculoskeletal:        General: No swelling, tenderness or deformity.     Cervical back: Normal range of motion and neck supple. No rigidity or tenderness.  Lymphadenopathy:     Head:     Right side of head: No submental, submandibular, tonsillar, preauricular, posterior auricular or occipital adenopathy.     Left side of head: No submental, submandibular, tonsillar, preauricular, posterior auricular or occipital adenopathy.     Cervical: No cervical adenopathy.     Right cervical: No superficial, deep or posterior cervical adenopathy.    Left cervical: No superficial, deep or posterior cervical adenopathy.     Upper Body:     Right upper body: No supraclavicular, axillary, pectoral or epitrochlear adenopathy.     Left upper body: No supraclavicular, axillary, pectoral or epitrochlear adenopathy.  Skin:    General: Skin is warm.     Coloration: Skin is not jaundiced.     Findings: No bruising or  erythema.     Comments: Less pale  Neurological:     General: No focal deficit present.     Mental Status: She is alert and oriented to person, place, and time. Mental status is at baseline.     Cranial Nerves: No cranial nerve deficit.  Psychiatric:        Mood and Affect: Mood normal.        Behavior: Behavior normal.        Thought Content: Thought content normal.        Judgment: Judgment normal.     LABORATORY DATA: I have personally reviewed the data as listed:  Appointment on 09/01/2022  Component Date Value Ref Range Status   WBC Count 09/01/2022 4.2  4.0 - 10.5 K/uL Final   RBC 09/01/2022 3.75 (L)  3.87 - 5.11 MIL/uL Final   Hemoglobin 09/01/2022 10.6 (L)  12.0 - 15.0 g/dL Final   HCT 29/56/2130 33.2 (L)  36.0 - 46.0 % Final   MCV 09/01/2022 88.5  80.0 - 100.0 fL Final   MCH 09/01/2022 28.3  26.0 - 34.0 pg Final   MCHC 09/01/2022 31.9  30.0 - 36.0 g/dL Final   RDW 86/57/8469 14.0  11.5 - 15.5 % Final   Platelet Count 09/01/2022 186  150 - 400 K/uL Final   nRBC 09/01/2022 0.0  0.0 - 0.2 % Final   Neutrophils Relative % 09/01/2022 57  % Final   Neutro Abs 09/01/2022 2.4  1.7 - 7.7 K/uL Final   Lymphocytes Relative 09/01/2022 24  % Final  Lymphs Abs 09/01/2022 1.0  0.7 - 4.0 K/uL Final   Monocytes Relative 09/01/2022 10  % Final   Monocytes Absolute 09/01/2022 0.4  0.1 - 1.0 K/uL Final   Eosinophils Relative 09/01/2022 8  % Final   Eosinophils Absolute 09/01/2022 0.3  0.0 - 0.5 K/uL Final   Basophils Relative 09/01/2022 1  % Final   Basophils Absolute 09/01/2022 0.0  0.0 - 0.1 K/uL Final   Immature Granulocytes 09/01/2022 0  % Final   Abs Immature Granulocytes 09/01/2022 0.01  0.00 - 0.07 K/uL Final   Performed at [facility], [address] [place], [place] [address]    RADIOGRAPHIC STUDIES: I have personally reviewed the radiological images as listed and agree with the findings in the report  No results found.  ASSESSMENT/PLAN  77  y.o.medical history notable for cervical spondylosis, colon polyps, diverticulitis, GERD, hypertension, coronary artery disease with angioplasty, osteoarthritis/osteoporosis, restless leg syndrome, hyperlipidemia, diabetes mellitus type 2, kidney cancer treated with partial nephrectomy, hysterectomy.  Patient has a long history of anemia  Anemia:  Multifactorial with main culprit being CKD due to prior partial nephrectomy.   Since patient is replete with iron held on IV replacement.  B12 and folate stores are adequate.  Will check CBC with diff today.   January 25 2020:  Received Procrit April 09 2022:  Hgb 9.9 Has not required Procrit since November .   June 09 2022- Hgb 10.1 Cr 1.47 Ferritin 427.  To continue Procrit injections September 24, 2022-ferritin 429 indicating iron stores adequate.  Hemoglobin 10.5 without use of an ESA since November 2023.  Will continue to hold ESA since she is not requiring it and hold on monthly CBCs.  See again in follow-up in 3 months time.  Anemia of chronic kidney disease:  In CKD patients, EPO levels are inadequately low with respect to the degree of anemia. EPO deficiency starts early in the course of CKD.  When eGFR falls below 30 ml/min per 1.73 m2 this deficiency becomes more severe.   Some CKD patients have EPO resistance, where normal range EPO levels coexist with low hemoglobin (Hb) levels, indicating that the bone marrow response to endogenous and exogenous EPO is blunted.  Therapy Discussed the benefit and risks (stroke, heart attack, heart failure, blood clots, and death)  of ESA's with patient.   Procrit dosing for CKD patients not on dialysis:  Per package insert.   Consider initiating Aranesp treatment only when the hemoglobin level is less than 10 g/dL and the  following considerations apply:  o The rate of hemoglobin decline indicates the likelihood of requiring a RBC transfusion and,  o Reducing the risk of alloimmunization and/or other RBC  transfusion-related risks is a goal.   If the Hgb exceeds 10 g/dL, reduce or interrupt the dose of Procrit and use the lowest dose of Procrit sufficient to reduce the need for RBC transfusions.    Leukopenia:  Mild and will follow.     Cancer Staging  No matching staging information was found for the patient.   No problem-specific Assessment & Plan notes found for this encounter.   No orders of the defined types were placed in this encounter.  20  minutes was spent in patient care.  This included time spent preparing to see the patient (e.g., review of tests), obtaining and/or reviewing separately obtained history, counseling and educating the patient, ordering tests; documenting clinical information in the electronic or other health record, independently interpreting results and  communicating results to the patient as well as coordination of care All questions were answered. The patient knows to call the clinic with any problems, questions or concerns.  20 minutes of face to face time spent with patient  This note was electronically signed.    Loni Muse, MD  09/22/2022 12:58 PM

## 2022-09-24 ENCOUNTER — Inpatient Hospital Stay: Payer: PPO

## 2022-09-24 ENCOUNTER — Inpatient Hospital Stay: Payer: PPO | Attending: Oncology | Admitting: Oncology

## 2022-09-24 VITALS — BP 170/63 | HR 55 | Temp 97.5°F | Resp 20 | Ht 60.4 in | Wt 160.9 lb

## 2022-09-24 DIAGNOSIS — D500 Iron deficiency anemia secondary to blood loss (chronic): Secondary | ICD-10-CM | POA: Diagnosis not present

## 2022-09-24 DIAGNOSIS — D649 Anemia, unspecified: Secondary | ICD-10-CM | POA: Diagnosis not present

## 2022-09-24 DIAGNOSIS — Z79899 Other long term (current) drug therapy: Secondary | ICD-10-CM

## 2022-09-24 DIAGNOSIS — N189 Chronic kidney disease, unspecified: Secondary | ICD-10-CM | POA: Insufficient documentation

## 2022-09-24 DIAGNOSIS — Z905 Acquired absence of kidney: Secondary | ICD-10-CM

## 2022-09-24 DIAGNOSIS — D631 Anemia in chronic kidney disease: Secondary | ICD-10-CM | POA: Insufficient documentation

## 2022-09-24 LAB — CBC WITH DIFFERENTIAL/PLATELET
Abs Immature Granulocytes: 0.01 10*3/uL (ref 0.00–0.07)
Basophils Absolute: 0 10*3/uL (ref 0.0–0.1)
Basophils Relative: 1 %
Eosinophils Absolute: 0.2 10*3/uL (ref 0.0–0.5)
Eosinophils Relative: 4 %
HCT: 33.1 % — ABNORMAL LOW (ref 36.0–46.0)
Hemoglobin: 10.5 g/dL — ABNORMAL LOW (ref 12.0–15.0)
Immature Granulocytes: 0 %
Lymphocytes Relative: 26 %
Lymphs Abs: 1.1 10*3/uL (ref 0.7–4.0)
MCH: 28.1 pg (ref 26.0–34.0)
MCHC: 31.7 g/dL (ref 30.0–36.0)
MCV: 88.5 fL (ref 80.0–100.0)
Monocytes Absolute: 0.4 10*3/uL (ref 0.1–1.0)
Monocytes Relative: 8 %
Neutro Abs: 2.6 10*3/uL (ref 1.7–7.7)
Neutrophils Relative %: 61 %
Platelets: 180 10*3/uL (ref 150–400)
RBC: 3.74 MIL/uL — ABNORMAL LOW (ref 3.87–5.11)
RDW: 14 % (ref 11.5–15.5)
WBC: 4.3 10*3/uL (ref 4.0–10.5)
nRBC: 0 % (ref 0.0–0.2)

## 2022-09-24 LAB — COMPREHENSIVE METABOLIC PANEL
ALT: 21 U/L (ref 0–44)
AST: 28 U/L (ref 15–41)
Albumin: 3.3 g/dL — ABNORMAL LOW (ref 3.5–5.0)
Alkaline Phosphatase: 93 U/L (ref 38–126)
Anion gap: 9 (ref 5–15)
BUN: 27 mg/dL — ABNORMAL HIGH (ref 8–23)
CO2: 27 mmol/L (ref 22–32)
Calcium: 9.3 mg/dL (ref 8.9–10.3)
Chloride: 104 mmol/L (ref 98–111)
Creatinine, Ser: 1.3 mg/dL — ABNORMAL HIGH (ref 0.44–1.00)
GFR, Estimated: 43 mL/min — ABNORMAL LOW (ref >60)
Glucose, Bld: 172 mg/dL — ABNORMAL HIGH (ref 70–99)
Potassium: 3.9 mmol/L (ref 3.5–5.1)
Sodium: 140 mmol/L (ref 135–145)
Total Bilirubin: 0.5 mg/dL (ref 0.3–1.2)
Total Protein: 6.6 g/dL (ref 6.5–8.1)

## 2022-09-24 LAB — FERRITIN: Ferritin: 429 ng/mL — ABNORMAL HIGH (ref 11–307)

## 2022-09-24 LAB — VITAMIN B12: Vitamin B-12: 1316 pg/mL — ABNORMAL HIGH (ref 180–914)

## 2022-09-24 LAB — FOLATE: Folate: 11.6 ng/mL (ref >5.9)

## 2022-09-25 ENCOUNTER — Telehealth: Payer: Self-pay | Admitting: Oncology

## 2022-09-25 NOTE — Telephone Encounter (Signed)
Contacted pt to schedule an appt. Unable to reach via phone, voicemail was left.   Message Received: Isaiah Blakes, CMA sent to [employer] Scheduling 3 months with labs

## 2022-09-30 DIAGNOSIS — F320 Major depressive disorder, single episode, mild: Secondary | ICD-10-CM | POA: Diagnosis not present

## 2022-09-30 DIAGNOSIS — N1832 Chronic kidney disease, stage 3b: Secondary | ICD-10-CM | POA: Diagnosis not present

## 2022-09-30 DIAGNOSIS — M8589 Other specified disorders of bone density and structure, multiple sites: Secondary | ICD-10-CM | POA: Diagnosis not present

## 2022-09-30 DIAGNOSIS — Z9861 Coronary angioplasty status: Secondary | ICD-10-CM | POA: Diagnosis not present

## 2022-09-30 DIAGNOSIS — I129 Hypertensive chronic kidney disease with stage 1 through stage 4 chronic kidney disease, or unspecified chronic kidney disease: Secondary | ICD-10-CM | POA: Diagnosis not present

## 2022-09-30 DIAGNOSIS — E1129 Type 2 diabetes mellitus with other diabetic kidney complication: Secondary | ICD-10-CM | POA: Diagnosis not present

## 2022-09-30 DIAGNOSIS — D509 Iron deficiency anemia, unspecified: Secondary | ICD-10-CM | POA: Diagnosis not present

## 2022-09-30 DIAGNOSIS — R809 Proteinuria, unspecified: Secondary | ICD-10-CM | POA: Diagnosis not present

## 2022-09-30 DIAGNOSIS — I2510 Atherosclerotic heart disease of native coronary artery without angina pectoris: Secondary | ICD-10-CM | POA: Diagnosis not present

## 2022-09-30 DIAGNOSIS — N183 Chronic kidney disease, stage 3 unspecified: Secondary | ICD-10-CM | POA: Diagnosis not present

## 2022-09-30 DIAGNOSIS — E785 Hyperlipidemia, unspecified: Secondary | ICD-10-CM | POA: Diagnosis not present

## 2022-10-01 ENCOUNTER — Encounter: Payer: Self-pay | Admitting: Oncology

## 2022-10-03 DIAGNOSIS — Z1231 Encounter for screening mammogram for malignant neoplasm of breast: Secondary | ICD-10-CM | POA: Diagnosis not present

## 2022-11-27 DIAGNOSIS — C642 Malignant neoplasm of left kidney, except renal pelvis: Secondary | ICD-10-CM | POA: Diagnosis not present

## 2022-12-20 DIAGNOSIS — S60212A Contusion of left wrist, initial encounter: Secondary | ICD-10-CM | POA: Diagnosis not present

## 2022-12-20 DIAGNOSIS — I10 Essential (primary) hypertension: Secondary | ICD-10-CM | POA: Diagnosis not present

## 2022-12-20 DIAGNOSIS — M542 Cervicalgia: Secondary | ICD-10-CM | POA: Diagnosis not present

## 2022-12-20 DIAGNOSIS — Z7982 Long term (current) use of aspirin: Secondary | ICD-10-CM | POA: Diagnosis not present

## 2022-12-20 DIAGNOSIS — R519 Headache, unspecified: Secondary | ICD-10-CM | POA: Diagnosis not present

## 2022-12-20 DIAGNOSIS — S5012XA Contusion of left forearm, initial encounter: Secondary | ICD-10-CM | POA: Diagnosis not present

## 2022-12-20 DIAGNOSIS — S199XXA Unspecified injury of neck, initial encounter: Secondary | ICD-10-CM | POA: Diagnosis not present

## 2022-12-20 DIAGNOSIS — W108XXA Fall (on) (from) other stairs and steps, initial encounter: Secondary | ICD-10-CM | POA: Diagnosis not present

## 2022-12-20 DIAGNOSIS — S0003XA Contusion of scalp, initial encounter: Secondary | ICD-10-CM | POA: Diagnosis not present

## 2022-12-20 DIAGNOSIS — S0181XA Laceration without foreign body of other part of head, initial encounter: Secondary | ICD-10-CM | POA: Diagnosis not present

## 2022-12-20 DIAGNOSIS — M79602 Pain in left arm: Secondary | ICD-10-CM | POA: Diagnosis not present

## 2022-12-26 DIAGNOSIS — R110 Nausea: Secondary | ICD-10-CM | POA: Diagnosis not present

## 2022-12-26 DIAGNOSIS — S5012XA Contusion of left forearm, initial encounter: Secondary | ICD-10-CM | POA: Diagnosis not present

## 2022-12-26 DIAGNOSIS — S0093XA Contusion of unspecified part of head, initial encounter: Secondary | ICD-10-CM | POA: Diagnosis not present

## 2022-12-26 DIAGNOSIS — S0180XA Unspecified open wound of other part of head, initial encounter: Secondary | ICD-10-CM | POA: Diagnosis not present

## 2022-12-26 DIAGNOSIS — S0511XA Contusion of eyeball and orbital tissues, right eye, initial encounter: Secondary | ICD-10-CM | POA: Diagnosis not present

## 2022-12-29 ENCOUNTER — Inpatient Hospital Stay: Payer: PPO | Attending: Oncology | Admitting: Oncology

## 2022-12-29 ENCOUNTER — Inpatient Hospital Stay: Payer: PPO

## 2022-12-29 VITALS — BP 179/64 | HR 55 | Temp 98.3°F | Resp 16 | Ht 60.4 in | Wt 165.5 lb

## 2022-12-29 DIAGNOSIS — Z85528 Personal history of other malignant neoplasm of kidney: Secondary | ICD-10-CM | POA: Diagnosis not present

## 2022-12-29 DIAGNOSIS — D72819 Decreased white blood cell count, unspecified: Secondary | ICD-10-CM | POA: Insufficient documentation

## 2022-12-29 DIAGNOSIS — D500 Iron deficiency anemia secondary to blood loss (chronic): Secondary | ICD-10-CM | POA: Diagnosis not present

## 2022-12-29 DIAGNOSIS — Z905 Acquired absence of kidney: Secondary | ICD-10-CM | POA: Diagnosis not present

## 2022-12-29 DIAGNOSIS — N189 Chronic kidney disease, unspecified: Secondary | ICD-10-CM | POA: Diagnosis not present

## 2022-12-29 DIAGNOSIS — F32A Depression, unspecified: Secondary | ICD-10-CM | POA: Insufficient documentation

## 2022-12-29 DIAGNOSIS — Z79899 Other long term (current) drug therapy: Secondary | ICD-10-CM | POA: Diagnosis not present

## 2022-12-29 DIAGNOSIS — K219 Gastro-esophageal reflux disease without esophagitis: Secondary | ICD-10-CM | POA: Insufficient documentation

## 2022-12-29 DIAGNOSIS — E1122 Type 2 diabetes mellitus with diabetic chronic kidney disease: Secondary | ICD-10-CM | POA: Diagnosis not present

## 2022-12-29 DIAGNOSIS — D631 Anemia in chronic kidney disease: Secondary | ICD-10-CM | POA: Insufficient documentation

## 2022-12-29 LAB — CBC WITH DIFFERENTIAL/PLATELET
Abs Immature Granulocytes: 0.02 10*3/uL (ref 0.00–0.07)
Basophils Absolute: 0 10*3/uL (ref 0.0–0.1)
Basophils Relative: 1 %
Eosinophils Absolute: 0.2 10*3/uL (ref 0.0–0.5)
Eosinophils Relative: 4 %
HCT: 32.6 % — ABNORMAL LOW (ref 36.0–46.0)
Hemoglobin: 10.2 g/dL — ABNORMAL LOW (ref 12.0–15.0)
Immature Granulocytes: 1 %
Lymphocytes Relative: 28 %
Lymphs Abs: 1.1 10*3/uL (ref 0.7–4.0)
MCH: 27.9 pg (ref 26.0–34.0)
MCHC: 31.3 g/dL (ref 30.0–36.0)
MCV: 89.1 fL (ref 80.0–100.0)
Monocytes Absolute: 0.4 10*3/uL (ref 0.1–1.0)
Monocytes Relative: 9 %
Neutro Abs: 2.4 10*3/uL (ref 1.7–7.7)
Neutrophils Relative %: 57 %
Platelets: 202 10*3/uL (ref 150–400)
RBC: 3.66 MIL/uL — ABNORMAL LOW (ref 3.87–5.11)
RDW: 13.9 % (ref 11.5–15.5)
WBC: 4.1 10*3/uL (ref 4.0–10.5)
nRBC: 0 % (ref 0.0–0.2)

## 2022-12-29 LAB — COMPREHENSIVE METABOLIC PANEL
ALT: 18 U/L (ref 0–44)
AST: 24 U/L (ref 15–41)
Albumin: 3.3 g/dL — ABNORMAL LOW (ref 3.5–5.0)
Alkaline Phosphatase: 83 U/L (ref 38–126)
Anion gap: 10 (ref 5–15)
BUN: 22 mg/dL (ref 8–23)
CO2: 26 mmol/L (ref 22–32)
Calcium: 9.8 mg/dL (ref 8.9–10.3)
Chloride: 104 mmol/L (ref 98–111)
Creatinine, Ser: 1.49 mg/dL — ABNORMAL HIGH (ref 0.44–1.00)
GFR, Estimated: 36 mL/min — ABNORMAL LOW (ref >60)
Glucose, Bld: 115 mg/dL — ABNORMAL HIGH (ref 70–99)
Potassium: 3.9 mmol/L (ref 3.5–5.1)
Sodium: 140 mmol/L (ref 135–145)
Total Bilirubin: 0.9 mg/dL (ref 0.3–1.2)
Total Protein: 6.7 g/dL (ref 6.5–8.1)

## 2022-12-29 LAB — FERRITIN: Ferritin: 434 ng/mL — ABNORMAL HIGH (ref 11–307)

## 2022-12-29 NOTE — Progress Notes (Signed)
[facility] Cancer Follow up Visit:  Patient Care Team: Paulina Fusi, MD as PCP - General (Internal Medicine) Joline Maxcy, MD (Urology)  CHIEF COMPLAINTS/PURPOSE OF CONSULTATION:  HISTORY OF PRESENTING ILLNESS: Julia Logan 77 y.o. female is here because of anemia  Medical history notable for cervical spondylosis, colon polyps, diverticulitis, GERD, hypertension, coronary artery disease with angioplasty, osteoarthritis/osteoporosis, restless leg syndrome, hyperlipidemia, diabetes mellitus type 2, kidney cancer treated with partial nephrectomy, hysterectomy  November 08 2019:  EGD and esophageal dilation.  Benign appearing 1.3 cm distal esophageal stricture at the GE junction.  The endoscope passed in the stomach with no resistance.  Stomach had multiple polyps in the fundus and body which were biopsied.  There was no evidence of gastric antral vascular ectatic lesions.  A hiatal hernia was present.  Duodenum was normal.  Antral and fundal biopsies were obtained for H. pylori testing. Colonoscopy normal to terminal ileum.  Pathology from the EGD showed fundic gland polyps, no metaplasia, dysplasia or carcinoma.  H pylori negative.   November 08, 2021: Presented to [facility] emergency room following a fall at home.  She was walking back up her steps at home when she "missed stepped" walking up the steps into her house and fell backwards landing with the back of her head hitting the concrete.  She denied loss of consciousness.   CT head Small amount of subarachnoid hemorrhage at the right frontal pole, over the anterior left temporal lobe and in the left Sylvian fissure.  Posterior scalp laceration without skull fracture.  No acute fracture or static subluxation of the cervical spine.  Patient was managed conservatively and discharged home  December 13, 2021: WBC 3.4 hemoglobin 9.4 MCV 92 white count 155; 6626 lymphs 10 monos 3 eos 1 basophil.  Iron saturation  27% CMP notable for glucose of 114 creatinine 1.46 with calculated GFR of 37 sodium 146 albumin 3.9 total protein 5.8.  Hemoglobin A1c 5.7  December 19 2021:  [facility] Hematology Consult Patient states that she has a 6 year history of anemia.   Takes oral iron only MWF due to constipation.  Has not received IV iron/ required PRBC's once in the past for management of post partum hemorrhage.   Has a normal diet.  No history of hemorrhage postoperatively requiring transfusion.     No hematochezia, melena, hemoptysis, hematuria.  No history of intra-articular or soft tissue bleeding  Does not use NSAIDS for pain.  Is not taking oral anticoagulants  Takes baby ASA daily.   No/has history of abnormal bleeding in family members Patient has symptoms of fatigue, pallor,  DOE, decreased performance status.  Patient has PICA to ice.    WBC 2.7 Hgb 9.4 MCV 94 PLT 167; 52 segs 31 lymphs 11 monos 4 eos 2 basophil's.  Tick 2.4% Coombs test negative haptoglobin 186 Vitamin B12 1064.  Folate 6.2 ferritin 501 SPEP with IEP showed no paraprotein.  Serum free kappa 23 lambda 16.7 with a kappa lambda 1.38 IgG 612 IgA 75 IgM 29  January 16 2022:  Reviewed results of labs with patient.  She is fatigued in the afternoon. Discuss use of ESA which included risks and benefits.    January 24 2022:  Procrit 20 000 units   February 26 2022:  Had cataract surgery yesterday.  Less fatigued and more energetic since starting procrit.   WBC 2.9 hemoglobin 10.6 platelet count 190.  56 segs 30 lymphs 9 monos 4 eos  1 basophil Ferritin 418 CMP notable for glucose 194 creatinine 1.6  April 09, 2022:    Reviewed results of labs with patient.  Feels well.   Ferritin 440 Hgb 9.9  June 09 2022:   Patient asked how long she needed to come here regarding anemia.  Feels well overall except for depression.  Was recently placed on Effexor.  Taking calcium and iron simultaneously.  Instructed her to take them at different times.   Vision improved since cataract surgery.   Chemistries notable for glucose 229 creatinine 1.47.  Ferritin 427 hemoglobin 10.1  September 24 2022:   Not feeling well today because sugars have been "out of wack" due to alteration in eating habits.  Mood better on Effexor but it has increased her appetite.  Has gained only 2 lbs since last visit.  Taking iron sulfate 325 mg at noon but occasionally misses doses.  Has not received Procrit since November 2023.   Hemoglobin 10.5 Ferritin 429 folate 11.6 B12 1316 Creatinine 1.3  October 10 2022:  CT AP Stable surgical changes related to a prior partial left nephrectomy. No CT findings suspicious for residual or recurrent tumor, locoregional adenopathy or metastatic disease elsewhere.    December 29 2022:  Scheduled follow-up for management of anemia.  Recently had a fall while going upstairs at a restaurant in [facility].  Larey Seat forward; injured right periorbital region and left forearm.  Developed substantial hematomas of both sites.  Was seen in ED and underwent imaging.  No fractures per patient.    Taking iron sulfate 325 mg three times per week.    Hemoglobin 10.2.  Creatinine 1.49 Ferritin 434   Review of Systems  Constitutional: Negative.   HENT:   Negative for mouth sores, sore throat and trouble swallowing.   Eyes:  Negative for icterus.  Respiratory:  Negative for chest tightness, cough and shortness of breath.   Cardiovascular:  Negative for chest pain, leg swelling and palpitations.  Psychiatric/Behavioral:  Positive for depression. The patient is nervous/anxious.     MEDICAL HISTORY: Past Medical History:  Diagnosis Date   Cervical spondylosis without myelopathy    Colon polyps    Diverticulitis of colon (without mention of hemorrhage)(562.11)    Esophageal reflux    Essential hypertension, benign    Heart disease    History of kidney cancer    Iron deficiency anemia, unspecified    Osteoarthrosis, unspecified whether generalized or  localized, shoulder region    Osteoporosis    Other and unspecified hyperlipidemia    Restless legs syndrome (RLS)    Type II or unspecified type diabetes mellitus without mention of complication, not stated as uncontrolled     SURGICAL HISTORY: Past Surgical History:  Procedure Laterality Date   APPENDECTOMY  03/18/1975   BUNIONECTOMY     CORONARY BALLOON ANGIOPLASTY N/A 11/09/2017   Procedure: CORONARY BALLOON ANGIOPLASTY;  Surgeon: Swaziland, Peter M, MD;  Location: [facility] INVASIVE CV LAB;  Service: Cardiovascular;  Laterality: N/A;   CORONARY STENT INTERVENTION N/A 01/01/2017   Procedure: CORONARY STENT INTERVENTION;  Surgeon: Swaziland, Peter M, MD;  Location: [facility] INVASIVE CV LAB;  Service: Cardiovascular;  Laterality: N/A;   KNEE SURGERY     LEFT HEART CATH AND CORONARY ANGIOGRAPHY N/A 01/01/2017   Procedure: LEFT HEART CATH AND CORONARY ANGIOGRAPHY;  Surgeon: Swaziland, Peter M, MD;  Location: [facility] INVASIVE CV LAB;  Service: Cardiovascular;  Laterality: N/A;   LEFT HEART CATH AND CORONARY ANGIOGRAPHY N/A 11/09/2017   Procedure:  LEFT HEART CATH AND CORONARY ANGIOGRAPHY;  Surgeon: Swaziland, Peter M, MD;  Location: [facility] INVASIVE CV LAB;  Service: Cardiovascular;  Laterality: N/A;   PARTIAL NEPHRECTOMY Left 2017   VAGINAL HYSTERECTOMY  03/18/1975    SOCIAL HISTORY: Social History   Socioeconomic History   Marital status: Married    Spouse name: Warden/ranger   Number of children: 2   Years of education: 17   Highest education level: Master's degree (e.g., MA, MS, MEng, MEd, MSW, MBA)  Occupational History   Occupation: retired Runner, broadcasting/film/video  Tobacco Use   Smoking status: Never   Smokeless tobacco: Never  Vaping Use   Vaping status: Never Used  Substance and Sexual Activity   Alcohol use: No   Drug use: No   Sexual activity: Not on file  Other Topics Concern   Not on file  Social History Narrative   Lives at home with husband.   Right-handed.   No daily caffeine use.   Social Determinants of  Health   Financial Resource Strain: Not on file  Food Insecurity: Low Risk  (11/27/2022)   Received from [facility]   Hunger Vital Sign    Worried About Running Out of Food in the Last Year: Never true    Ran Out of Food in the Last Year: Never true  Transportation Needs: No Transportation Needs (11/27/2022)   Received from [facility]    In the past 12 months, has lack of reliable transportation kept you from medical appointments, meetings, work or from getting things needed for daily living? : No  Physical Activity: Not on file  Stress: Not on file  Social Connections: Unknown (10/08/2021)   Received from [facility]   Social Network    Social Network: Not on file  Intimate Partner Violence: Not At Risk (12/20/2022)   Received from [facility]   Abuse Screen    Feels Unsafe at Home or Work/School: no    Feels Threatened by Someone: no    Does Anyone Try to Keep You From Having Contact with Others or Doing Things Outside Your Home?: no    Physical Signs of Abuse Present: no    FAMILY HISTORY Family History  Problem Relation Age of Onset   Diabetes Maternal Grandfather    Heart attack Father    Diabetes Sister    Hypertension Sister    Diabetes Brother    Hypertension Brother     ALLERGIES:  is allergic to gabapentin, ticagrelor, and codeine.  MEDICATIONS:  Current Outpatient Medications  Medication Sig Dispense Refill   aspirin 81 MG tablet Take 81 mg by mouth daily.     atorvastatin (LIPITOR) 80 MG tablet TAKE 1 TABLET(80 MG) BY MOUTH DAILY 90 tablet 3   Calcium Carb-Cholecalciferol (CALCIUM/VITAMIN D PO) Take 1 tablet by mouth daily. 1200-1000 mg-unit     ciprofloxacin (CIPRO) 250 MG tablet Take 250 mg by mouth 2 (two) times daily.     estradiol (ESTRACE) 0.1 MG/GM vaginal cream Place 1 Applicatorful vaginally 3 (three) times a week.     ezetimibe (ZETIA) 10 MG tablet Take 10 mg by mouth daily.   3    Ferrous Sulfate (IRON) 325 (65 Fe) MG TABS Take 325 mg by mouth every other day.     glimepiride (AMARYL) 4 MG tablet Take 4 mg by mouth daily with breakfast.   3   olmesartan (BENICAR) 20 MG tablet Take 20 mg by mouth daily.  pantoprazole (PROTONIX) 40 MG tablet Take 40 mg by mouth daily.     Probiotic Product (PROBIOTIC PO) Take 1 capsule by mouth daily.     ranolazine (RANEXA) 500 MG 12 hr tablet Take 1 tablet (500 mg total) by mouth 2 (two) times daily. 180 tablet 3   metoprolol tartrate (LOPRESSOR) 25 MG tablet Take 0.5 tablets (12.5 mg total) by mouth 2 (two) times daily. 90 tablet 3   No current facility-administered medications for this visit.    PHYSICAL EXAMINATION:  ECOG PERFORMANCE STATUS: 1 - Symptomatic but completely ambulatory   Vitals:   12/29/22 1414  BP: (!) 179/64  Pulse: (!) 55  Resp: 16  Temp: 98.3 F (36.8 C)  SpO2: 99%     Filed Weights   12/29/22 1414  Weight: 165 lb 8 oz (75.1 kg)      Physical Exam Vitals and nursing note reviewed.  Constitutional:      General: She is not in acute distress.    Appearance: Normal appearance. She is normal weight. She is not ill-appearing, toxic-appearing or diaphoretic.     Comments: Here alone.  Comfortable  HENT:     Head: Normocephalic and atraumatic.     Right Ear: External ear normal.     Left Ear: External ear normal.     Nose: Nose normal. No congestion or rhinorrhea.  Eyes:     General: No scleral icterus.    Extraocular Movements: Extraocular movements intact.     Conjunctiva/sclera: Conjunctivae normal.     Pupils: Pupils are equal, round, and reactive to light.  Cardiovascular:     Rate and Rhythm: Normal rate and regular rhythm.     Heart sounds: Normal heart sounds. No murmur heard.    No friction rub. No gallop.  Pulmonary:     Effort: Pulmonary effort is normal. No respiratory distress.     Breath sounds: Normal breath sounds. No stridor. No wheezing, rhonchi or rales.  Chest:      Chest wall: No tenderness.  Abdominal:     General: Bowel sounds are normal. There is no distension.     Palpations: Abdomen is soft. There is no mass.     Tenderness: There is no abdominal tenderness. There is no guarding or rebound.     Hernia: No hernia is present.  Musculoskeletal:        General: No swelling, tenderness or deformity.     Cervical back: Normal range of motion and neck supple. No rigidity or tenderness.  Lymphadenopathy:     Head:     Right side of head: No submental, submandibular, tonsillar, preauricular, posterior auricular or occipital adenopathy.     Left side of head: No submental, submandibular, tonsillar, preauricular, posterior auricular or occipital adenopathy.     Cervical: No cervical adenopathy.     Right cervical: No superficial, deep or posterior cervical adenopathy.    Left cervical: No superficial, deep or posterior cervical adenopathy.     Upper Body:     Right upper body: No supraclavicular, axillary, pectoral or epitrochlear adenopathy.     Left upper body: No supraclavicular, axillary, pectoral or epitrochlear adenopathy.  Skin:    General: Skin is warm.     Coloration: Skin is not jaundiced.     Findings: No bruising or erythema.     Comments: Less pale  Neurological:     General: No focal deficit present.     Mental Status: She is alert and oriented to person, place,  and time. Mental status is at baseline.     Cranial Nerves: No cranial nerve deficit.  Psychiatric:        Mood and Affect: Mood normal.        Behavior: Behavior normal.        Thought Content: Thought content normal.        Judgment: Judgment normal.     LABORATORY DATA: I have personally reviewed the data as listed:  No visits with results within 1 Month(s) from this visit.  Latest known visit with results is:  Appointment on 09/24/2022  Component Date Value Ref Range Status   Folate 09/24/2022 11.6  >5.9 ng/mL Final   Performed at [facility],  [address] [place], [place] [address]   Vitamin B-12 09/24/2022 1,316 (H)  180 - 914 pg/mL Final   Comment: (NOTE) This assay is not validated for testing neonatal or myeloproliferative syndrome specimens for Vitamin B12 levels. Performed at [facility], [address] [place], [place] [address]    Ferritin 09/24/2022 429 (H)  11 - 307 ng/mL Final   Performed at [facility], [address] [place], [place] [address]    RADIOGRAPHIC STUDIES: I have personally reviewed the radiological images as listed and agree with the findings in the report  No results found.  ASSESSMENT/PLAN  77 y.o.medical history notable for cervical spondylosis, colon polyps, diverticulitis, GERD, hypertension, coronary artery disease with angioplasty, osteoarthritis/osteoporosis, restless leg syndrome, hyperlipidemia, diabetes mellitus type 2, kidney cancer treated with partial nephrectomy, hysterectomy.  Patient has a long history of anemia  Anemia:  Multifactorial with main culprit being CKD due to prior partial nephrectomy.   Since patient is replete with iron held on IV replacement.  B12 and folate stores are adequate.  Will check CBC with diff today.   January 25 2020:  Received Procrit April 09 2022:  Hgb 9.9 Has not required Procrit since November .   June 09 2022- Hgb 10.1 Cr 1.47 Ferritin 427.  To continue Procrit injections September 24, 2022-ferritin 429 indicating iron stores adequate.  Hemoglobin 10.5 without use of an ESA since November 2023.  Will continue to hold ESA since she is not requiring it and hold on monthly CBCs.  December 29 2022- Hgb 10.2 Ferritin 434.  Iron stores adequate.  No need for ESA at this time.    Anemia of chronic kidney disease:  In CKD patients, EPO levels are inadequately low with respect to the degree of anemia. EPO deficiency starts early in the course of CKD.  When eGFR falls below 30 ml/min per 1.73 m2 this deficiency becomes  more severe.   Some CKD patients have EPO resistance, where normal range EPO levels coexist with low hemoglobin (Hb) levels, indicating that the bone marrow response to endogenous and exogenous EPO is blunted.  Therapy Discussed the benefit and risks (stroke, heart attack, heart failure, blood clots, and death)  of ESA's with patient.   Procrit dosing for CKD patients not on dialysis:  Per package insert.   Consider initiating Aranesp treatment only when the hemoglobin level is less than 10 g/dL and the  following considerations apply:  o The rate of hemoglobin decline indicates the likelihood of requiring a RBC transfusion and,  o Reducing the risk of alloimmunization and/or other RBC transfusion-related risks is a goal.   If the Hgb exceeds 10 g/dL, reduce or interrupt the dose of Procrit and use the lowest dose of Procrit sufficient  to reduce the need for RBC transfusions.    Leukopenia:  Mild and will follow.     Cancer Staging  No matching staging information was found for the patient.    No problem-specific Assessment & Plan notes found for this encounter.   No orders of the defined types were placed in this encounter.  20  minutes was spent in patient care.  This included time spent preparing to see the patient (e.g., review of tests), obtaining and/or reviewing separately obtained history, counseling and educating the patient, ordering tests; documenting clinical information in the electronic or other health record, independently interpreting results and communicating results to the patient as well as coordination of care All questions were answered. The patient knows to call the clinic with any problems, questions or concerns.  20 minutes of face to face time spent with patient  This note was electronically signed.    Loni Muse, MD  12/29/2022 2:17 PM

## 2022-12-31 ENCOUNTER — Telehealth: Payer: Self-pay | Admitting: Oncology

## 2022-12-31 DIAGNOSIS — C642 Malignant neoplasm of left kidney, except renal pelvis: Secondary | ICD-10-CM | POA: Diagnosis not present

## 2022-12-31 NOTE — Telephone Encounter (Signed)
12/31/22 LVM -Appt scheduled on 05/03/22

## 2023-01-09 ENCOUNTER — Encounter: Payer: Self-pay | Admitting: Oncology

## 2023-01-15 DIAGNOSIS — R809 Proteinuria, unspecified: Secondary | ICD-10-CM | POA: Diagnosis not present

## 2023-01-15 DIAGNOSIS — E1129 Type 2 diabetes mellitus with other diabetic kidney complication: Secondary | ICD-10-CM | POA: Diagnosis not present

## 2023-01-15 DIAGNOSIS — F320 Major depressive disorder, single episode, mild: Secondary | ICD-10-CM | POA: Diagnosis not present

## 2023-01-15 DIAGNOSIS — N1832 Chronic kidney disease, stage 3b: Secondary | ICD-10-CM | POA: Diagnosis not present

## 2023-01-15 DIAGNOSIS — S40022D Contusion of left upper arm, subsequent encounter: Secondary | ICD-10-CM | POA: Diagnosis not present

## 2023-01-15 DIAGNOSIS — D509 Iron deficiency anemia, unspecified: Secondary | ICD-10-CM | POA: Diagnosis not present

## 2023-01-15 DIAGNOSIS — I129 Hypertensive chronic kidney disease with stage 1 through stage 4 chronic kidney disease, or unspecified chronic kidney disease: Secondary | ICD-10-CM | POA: Diagnosis not present

## 2023-01-15 DIAGNOSIS — Z9861 Coronary angioplasty status: Secondary | ICD-10-CM | POA: Diagnosis not present

## 2023-01-15 DIAGNOSIS — I2510 Atherosclerotic heart disease of native coronary artery without angina pectoris: Secondary | ICD-10-CM | POA: Diagnosis not present

## 2023-01-15 DIAGNOSIS — M25572 Pain in left ankle and joints of left foot: Secondary | ICD-10-CM | POA: Diagnosis not present

## 2023-01-15 DIAGNOSIS — E785 Hyperlipidemia, unspecified: Secondary | ICD-10-CM | POA: Diagnosis not present

## 2023-01-15 DIAGNOSIS — N183 Chronic kidney disease, stage 3 unspecified: Secondary | ICD-10-CM | POA: Diagnosis not present

## 2023-01-15 DIAGNOSIS — D631 Anemia in chronic kidney disease: Secondary | ICD-10-CM | POA: Diagnosis not present

## 2023-01-20 DIAGNOSIS — S5012XA Contusion of left forearm, initial encounter: Secondary | ICD-10-CM | POA: Diagnosis not present

## 2023-01-23 DIAGNOSIS — E1122 Type 2 diabetes mellitus with diabetic chronic kidney disease: Secondary | ICD-10-CM | POA: Diagnosis not present

## 2023-01-23 DIAGNOSIS — M810 Age-related osteoporosis without current pathological fracture: Secondary | ICD-10-CM | POA: Diagnosis not present

## 2023-01-23 DIAGNOSIS — M1990 Unspecified osteoarthritis, unspecified site: Secondary | ICD-10-CM | POA: Diagnosis not present

## 2023-01-23 DIAGNOSIS — K219 Gastro-esophageal reflux disease without esophagitis: Secondary | ICD-10-CM | POA: Diagnosis not present

## 2023-01-23 DIAGNOSIS — Z7982 Long term (current) use of aspirin: Secondary | ICD-10-CM | POA: Diagnosis not present

## 2023-01-23 DIAGNOSIS — F32A Depression, unspecified: Secondary | ICD-10-CM | POA: Diagnosis not present

## 2023-01-23 DIAGNOSIS — I129 Hypertensive chronic kidney disease with stage 1 through stage 4 chronic kidney disease, or unspecified chronic kidney disease: Secondary | ICD-10-CM | POA: Diagnosis not present

## 2023-01-23 DIAGNOSIS — N183 Chronic kidney disease, stage 3 unspecified: Secondary | ICD-10-CM | POA: Diagnosis not present

## 2023-01-23 DIAGNOSIS — S5012XA Contusion of left forearm, initial encounter: Secondary | ICD-10-CM | POA: Diagnosis not present

## 2023-01-23 DIAGNOSIS — E669 Obesity, unspecified: Secondary | ICD-10-CM | POA: Diagnosis not present

## 2023-01-23 DIAGNOSIS — Z7984 Long term (current) use of oral hypoglycemic drugs: Secondary | ICD-10-CM | POA: Diagnosis not present

## 2023-01-23 DIAGNOSIS — Z7951 Long term (current) use of inhaled steroids: Secondary | ICD-10-CM | POA: Diagnosis not present

## 2023-01-23 DIAGNOSIS — Z79899 Other long term (current) drug therapy: Secondary | ICD-10-CM | POA: Diagnosis not present

## 2023-01-23 DIAGNOSIS — Z6830 Body mass index (BMI) 30.0-30.9, adult: Secondary | ICD-10-CM | POA: Diagnosis not present

## 2023-01-23 DIAGNOSIS — I2510 Atherosclerotic heart disease of native coronary artery without angina pectoris: Secondary | ICD-10-CM | POA: Diagnosis not present

## 2023-01-26 ENCOUNTER — Other Ambulatory Visit: Payer: Self-pay | Admitting: Cardiology

## 2023-02-28 ENCOUNTER — Other Ambulatory Visit: Payer: Self-pay | Admitting: Cardiology

## 2023-03-02 ENCOUNTER — Other Ambulatory Visit: Payer: Self-pay | Admitting: Cardiology

## 2023-03-02 NOTE — Telephone Encounter (Signed)
Rx refill sent to pharmacy. 

## 2023-03-10 ENCOUNTER — Telehealth: Payer: Self-pay | Admitting: Cardiology

## 2023-03-10 MED ORDER — RANOLAZINE ER 500 MG PO TB12: 500.0000 mg | ORAL_TABLET | Freq: Two times a day (BID) | ORAL | 0 refills | Status: DC

## 2023-03-10 NOTE — Telephone Encounter (Signed)
Pt's medication was sent to pt's pharmacy as requested. Confirmation received.  °

## 2023-03-10 NOTE — Telephone Encounter (Signed)
*  STAT* If patient is at the pharmacy, call can be transferred to refill team.   1. Which medications need to be refilled? (please list name of each medication and dose if known) Ronolazine   2. Would you like to learn more about the convenience, safety, & potential cost savings by using the [facility]?   3. Are you open to using the [facility] (Type [facility]   4. Which pharmacy/location (including street and city if local pharmacy) is medication to be sent to? [facility]  [address], [place]  5. Do they need a 30 day or 90 day supply? 90 days and refills

## 2023-03-16 ENCOUNTER — Encounter: Payer: Self-pay | Admitting: Oncology

## 2023-04-02 ENCOUNTER — Encounter: Payer: Self-pay | Admitting: Cardiology

## 2023-04-02 DIAGNOSIS — Z955 Presence of coronary angioplasty implant and graft: Secondary | ICD-10-CM

## 2023-04-02 HISTORY — DX: Presence of coronary angioplasty implant and graft: Z95.5

## 2023-04-02 NOTE — Progress Notes (Signed)
Cardiology Office Note:    Date:  04/03/2023   ID:  Julia Logan, DOB Aug 31, 1945, MRN [id number]  PCP:  Paulina Fusi, MD  Cardiologist:  Norman Herrlich, MD    Referring MD: Paulina Fusi, MD    ASSESSMENT:    1. Coronary artery disease involving native coronary artery of native heart with angina pectoris (HCC)   2. Mixed hyperlipidemia   3. Essential hypertension   4. Stage 3 chronic kidney disease, unspecified whether stage 3a or 3b CKD (HCC)    PLAN:    In order of problems listed above:  She continues to do well with CAD is now 5 years remote from her last PCI show continue treatment including her aspirin high intensity statin beta-blocker and ranolazine.  This time I do not think she requires cardiac imaging or stress modality if she had recurrent typical angina she had the repeat coronary angiography Lipids are well-controlled she will continue her high intensity statin and Zetia well-tolerated Home blood pressures run 130-140/70-80 continue her current treatment beta-blocker Stable CKD She follows with hematology for her anemia   Next appointment: 1 year   Medication Adjustments/Labs and Tests Ordered: Current medicines are reviewed at length with the patient today.  Concerns regarding medicines are outlined above.  Orders Placed This Encounter  Procedures   EKG 12-Lead   Meds ordered this encounter  Medications   metoprolol tartrate (LOPRESSOR) 25 MG tablet    Sig: Take 0.5 tablets (12.5 mg total) by mouth 2 (two) times daily.    Dispense:  90 tablet    Refill:  3     History of Present Illness:    Julia Logan is a 78 y.o. female with a hx of CAD with multiple PCI's for in-stent restenosis of the right coronary artery hypertension hyperlipidemia stage III CKD and iron deficiency anemia last seen 01/30/2022.  Compliance with diet, lifestyle and medications: Yes  She continues to do well with CAD has had no anginal discomfort is now 5 years remote  from her last PCI for recurrent restenosis and has no angina edema shortness of breath palpitation or syncope she tolerates her high intensity statin without muscle pain or weakness recent labs 01/15/2023 LDL 75 cholesterol 148 non-HDL cholesterol 94 A1c 7.0 hemoglobin 10.5 creatinine 1.55 Past Medical History:  Diagnosis Date   Abnormal nuclear stress test 09/09/2017   Acid reflux disease 05/05/2017   Anemia of chronic kidney failure, unspecified stage 01/16/2022   Atrophic vaginitis 08/14/2016   Cancer of kidney (HCC) 10/04/2015   Cervical spondylosis without myelopathy    Chronic pain 05/05/2017   Overview:   back and right side     CKD (chronic kidney disease) stage 3, GFR 30-59 ml/min (HCC) 10/27/2017   Colon polyps    Coronary artery disease involving native coronary artery of native heart with angina pectoris (HCC) 01/01/2017   Diabetes mellitus (HCC) 12/31/2016   Diverticulitis of colon (without mention of hemorrhage)(562.11)    ESA (erythropoietin stimulating agent) anemia management patient 05/05/2017   Esophageal reflux    Essential hypertension, benign    Hard of hearing 05/05/2017   Heart disease    History of heart artery stent 04/02/2023   History of kidney cancer    History of nephrectomy 03/19/2016   History of recurrent UTI (urinary tract infection) 05/05/2017   Hyperlipidemia 05/05/2017   Hypertension 12/31/2016   Iron deficiency anemia due to chronic blood loss 12/19/2021   Iron deficiency anemia, unspecified  Malignant neoplasm of left kidney (HCC) 10/09/2017   Osteoarthrosis, unspecified whether generalized or localized, shoulder region    Osteoporosis    Other and unspecified hyperlipidemia    Restless legs syndrome (RLS)    Sciatica, right side 05/05/2017   Stable angina pectoris (HCC) 05/11/2017   Type II or unspecified type diabetes mellitus without mention of complication, not stated as uncontrolled    Urinary tract infection 09/24/2015    Current  Medications: Current Meds  Medication Sig   aspirin 81 MG tablet Take 81 mg by mouth daily.   atorvastatin (LIPITOR) 80 MG tablet Take 1 tablet (80 mg total) by mouth daily. PLease keep appt in January   Calcium Carb-Cholecalciferol (CALCIUM/VITAMIN D PO) Take 1 tablet by mouth daily. 1200-1000 mg-unit   estradiol (ESTRACE) 0.1 MG/GM vaginal cream Place 1 Applicatorful vaginally 3 (three) times a week.   ezetimibe (ZETIA) 10 MG tablet Take 10 mg by mouth daily.    Ferrous Sulfate (IRON) 325 (65 Fe) MG TABS Take 325 mg by mouth every other day.   glimepiride (AMARYL) 4 MG tablet Take 4 mg by mouth daily with breakfast.    metoprolol tartrate (LOPRESSOR) 25 MG tablet Take 0.5 tablets (12.5 mg total) by mouth 2 (two) times daily.   olmesartan (BENICAR) 20 MG tablet Take 20 mg by mouth daily.   pantoprazole (PROTONIX) 40 MG tablet Take 40 mg by mouth daily.   Probiotic Product (PROBIOTIC PO) Take 1 capsule by mouth daily.   ranolazine (RANEXA) 500 MG 12 hr tablet Take 1 tablet (500 mg total) by mouth 2 (two) times daily.   venlafaxine XR (EFFEXOR-XR) 75 MG 24 hr capsule Take 75 mg by mouth daily.      EKGs/Labs/Other Studies Reviewed:    The following studies were reviewed today:    EKG Interpretation Date/Time:  Friday April 03 2023 14:09:45 EST Ventricular Rate:  64 PR Interval:  144 QRS Duration:  76 QT Interval:  434 QTC Calculation: 447 R Axis:   -1  Text Interpretation: Normal sinus rhythm Normal ECG When compared with ECG of 09-Nov-2017 12:19, No significant change was found Confirmed by Norman Herrlich ([id number]) on 04/03/2023 2:14:11 PM    Recent Labs: 12/29/2022: ALT 18; BUN 22; Creatinine, Ser 1.49; Hemoglobin 10.2; Platelets 202; Potassium 3.9; Sodium 140  Recent Lipid Panel    Component Value Date/Time   CHOL 143 10/15/2017 1310   TRIG 106 10/15/2017 1310   HDL 54 10/15/2017 1310   CHOLHDL 2.6 10/15/2017 1310   LDLCALC 68 10/15/2017 1310    Physical Exam:    VS:   BP (!) 152/78   Pulse 64   Ht 5\' 1"  (1.549 m)   Wt 168 lb 3.2 oz (76.3 kg)   SpO2 98%   BMI 31.78 kg/m     Wt Readings from Last 3 Encounters:  04/03/23 168 lb 3.2 oz (76.3 kg)  12/29/22 165 lb 8 oz (75.1 kg)  09/24/22 160 lb 14.4 oz (73 kg)     GEN:  Well nourished, well developed in no acute distress HEENT: Normal NECK: No JVD; No carotid bruits LYMPHATICS: No lymphadenopathy CARDIAC: RRR, no murmurs, rubs, gallops RESPIRATORY:  Clear to auscultation without rales, wheezing or rhonchi  ABDOMEN: Soft, non-tender, non-distended MUSCULOSKELETAL:  No edema; No deformity  SKIN: Warm and dry NEUROLOGIC:  Alert and oriented x 3 PSYCHIATRIC:  Normal affect    Signed, Norman Herrlich, MD  04/03/2023 2:27 PM    [facility]

## 2023-04-03 ENCOUNTER — Ambulatory Visit: Payer: PPO | Attending: Cardiology | Admitting: Cardiology

## 2023-04-03 ENCOUNTER — Encounter: Payer: Self-pay | Admitting: Oncology

## 2023-04-03 ENCOUNTER — Encounter: Payer: Self-pay | Admitting: Cardiology

## 2023-04-03 VITALS — BP 152/78 | HR 64 | Ht 61.0 in | Wt 168.2 lb

## 2023-04-03 DIAGNOSIS — I25119 Atherosclerotic heart disease of native coronary artery with unspecified angina pectoris: Secondary | ICD-10-CM | POA: Diagnosis not present

## 2023-04-03 DIAGNOSIS — I10 Essential (primary) hypertension: Secondary | ICD-10-CM

## 2023-04-03 DIAGNOSIS — E782 Mixed hyperlipidemia: Secondary | ICD-10-CM | POA: Diagnosis not present

## 2023-04-03 DIAGNOSIS — N183 Chronic kidney disease, stage 3 unspecified: Secondary | ICD-10-CM

## 2023-04-03 MED ORDER — METOPROLOL TARTRATE 25 MG PO TABS: 12.5000 mg | ORAL_TABLET | Freq: Two times a day (BID) | ORAL | 3 refills | Status: DC

## 2023-04-03 NOTE — Patient Instructions (Signed)

## 2023-04-27 DIAGNOSIS — N1832 Chronic kidney disease, stage 3b: Secondary | ICD-10-CM | POA: Diagnosis not present

## 2023-04-27 DIAGNOSIS — R809 Proteinuria, unspecified: Secondary | ICD-10-CM | POA: Diagnosis not present

## 2023-04-27 DIAGNOSIS — D509 Iron deficiency anemia, unspecified: Secondary | ICD-10-CM | POA: Diagnosis not present

## 2023-04-27 DIAGNOSIS — D631 Anemia in chronic kidney disease: Secondary | ICD-10-CM | POA: Diagnosis not present

## 2023-04-27 DIAGNOSIS — N183 Chronic kidney disease, stage 3 unspecified: Secondary | ICD-10-CM | POA: Diagnosis not present

## 2023-04-27 DIAGNOSIS — F320 Major depressive disorder, single episode, mild: Secondary | ICD-10-CM | POA: Diagnosis not present

## 2023-04-27 DIAGNOSIS — E1129 Type 2 diabetes mellitus with other diabetic kidney complication: Secondary | ICD-10-CM | POA: Diagnosis not present

## 2023-04-27 DIAGNOSIS — Z9861 Coronary angioplasty status: Secondary | ICD-10-CM | POA: Diagnosis not present

## 2023-04-27 DIAGNOSIS — I129 Hypertensive chronic kidney disease with stage 1 through stage 4 chronic kidney disease, or unspecified chronic kidney disease: Secondary | ICD-10-CM | POA: Diagnosis not present

## 2023-04-27 DIAGNOSIS — E785 Hyperlipidemia, unspecified: Secondary | ICD-10-CM | POA: Diagnosis not present

## 2023-04-27 DIAGNOSIS — I2510 Atherosclerotic heart disease of native coronary artery without angina pectoris: Secondary | ICD-10-CM | POA: Diagnosis not present

## 2023-05-04 ENCOUNTER — Inpatient Hospital Stay: Payer: PPO

## 2023-05-04 ENCOUNTER — Inpatient Hospital Stay: Payer: PPO | Attending: Oncology | Admitting: Oncology

## 2023-05-04 ENCOUNTER — Ambulatory Visit: Payer: PPO | Admitting: Oncology

## 2023-05-04 ENCOUNTER — Other Ambulatory Visit: Payer: Self-pay | Admitting: Oncology

## 2023-05-04 ENCOUNTER — Other Ambulatory Visit: Payer: Self-pay

## 2023-05-04 ENCOUNTER — Other Ambulatory Visit: Payer: PPO

## 2023-05-04 VITALS — BP 155/65 | HR 62 | Temp 98.1°F | Resp 16 | Ht 61.0 in | Wt 166.6 lb

## 2023-05-04 DIAGNOSIS — N189 Chronic kidney disease, unspecified: Secondary | ICD-10-CM | POA: Diagnosis not present

## 2023-05-04 DIAGNOSIS — Z79899 Other long term (current) drug therapy: Secondary | ICD-10-CM | POA: Diagnosis not present

## 2023-05-04 DIAGNOSIS — D631 Anemia in chronic kidney disease: Secondary | ICD-10-CM | POA: Insufficient documentation

## 2023-05-04 LAB — CMP (CANCER CENTER ONLY)
ALT: 19 U/L (ref 0–44)
AST: 29 U/L (ref 15–41)
Albumin: 4.2 g/dL (ref 3.5–5.0)
Alkaline Phosphatase: 116 U/L (ref 38–126)
Anion gap: 11 (ref 5–15)
BUN: 28 mg/dL — ABNORMAL HIGH (ref 8–23)
CO2: 27 mmol/L (ref 22–32)
Calcium: 10.7 mg/dL — ABNORMAL HIGH (ref 8.9–10.3)
Chloride: 104 mmol/L (ref 98–111)
Creatinine: 1.66 mg/dL — ABNORMAL HIGH (ref 0.44–1.00)
GFR, Estimated: 31 mL/min — ABNORMAL LOW (ref >60)
Glucose, Bld: 176 mg/dL — ABNORMAL HIGH (ref 70–99)
Potassium: 4.6 mmol/L (ref 3.5–5.1)
Sodium: 141 mmol/L (ref 135–145)
Total Bilirubin: 0.7 mg/dL (ref 0.0–1.2)
Total Protein: 6.8 g/dL (ref 6.5–8.1)

## 2023-05-04 LAB — CBC WITH DIFFERENTIAL (CANCER CENTER ONLY)
Abs Immature Granulocytes: 0.02 10*3/uL (ref 0.00–0.07)
Basophils Absolute: 0 10*3/uL (ref 0.0–0.1)
Basophils Relative: 1 %
Eosinophils Absolute: 0.2 10*3/uL (ref 0.0–0.5)
Eosinophils Relative: 5 %
HCT: 35.4 % — ABNORMAL LOW (ref 36.0–46.0)
Hemoglobin: 11.9 g/dL — ABNORMAL LOW (ref 12.0–15.0)
Immature Granulocytes: 0 %
Lymphocytes Relative: 26 %
Lymphs Abs: 1.2 10*3/uL (ref 0.7–4.0)
MCH: 28.7 pg (ref 26.0–34.0)
MCHC: 33.6 g/dL (ref 30.0–36.0)
MCV: 85.3 fL (ref 80.0–100.0)
Monocytes Absolute: 0.3 10*3/uL (ref 0.1–1.0)
Monocytes Relative: 7 %
Neutro Abs: 2.7 10*3/uL (ref 1.7–7.7)
Neutrophils Relative %: 61 %
Platelet Count: 202 10*3/uL (ref 150–400)
RBC: 4.15 MIL/uL (ref 3.87–5.11)
RDW: 13.4 % (ref 11.5–15.5)
WBC Count: 4.5 10*3/uL (ref 4.0–10.5)
nRBC: 0 % (ref 0.0–0.2)
nRBC: 0 /100{WBCs}

## 2023-05-04 LAB — FERRITIN: Ferritin: 439 ng/mL — ABNORMAL HIGH (ref 11–307)

## 2023-05-04 LAB — IRON AND TIBC
Iron: 94 ug/dL (ref 28–170)
Saturation Ratios: 32 % — ABNORMAL HIGH (ref 10.4–31.8)
TIBC: 294 ug/dL (ref 250–450)
UIBC: 200 ug/dL

## 2023-05-04 NOTE — Progress Notes (Signed)
[facility]  [address] [place],  [place]  [address] [phone]  Clinic Day:  05/04/2023  Referring physician: Paulina Fusi, MD   HISTORY OF PRESENT ILLNESS:  The patient is a 78 y.o. female with anemia that appears to be related to chronic renal insufficiency.  Of note, she has had a partial left nephrectomy in 2017 for kidney cancer. Despite this diagnosis, the patient has only had one dose of Retacrit for her anemia, which was back in November 2023.  She comes in today for routine follow-up.  Since her last visit, the patient has been doing well.  She denies having increased fatigue or any overt forms of blood loss which concern her for progressive anemia.  Of note, her last GI workup in 2021 did not reveal any ominous upper GI tract pathology.  PHYSICAL EXAM:  Blood pressure (!) 155/65, pulse 62, temperature 98.1 F (36.7 C), temperature source Oral, resp. rate 16, height 5\' 1"  (1.549 m), weight 166 lb 9.6 oz (75.6 kg), SpO2 98%. Wt Readings from Last 3 Encounters:  05/04/23 166 lb 9.6 oz (75.6 kg)  04/03/23 168 lb 3.2 oz (76.3 kg)  12/29/22 165 lb 8 oz (75.1 kg)   Body mass index is 31.48 kg/m. Performance status (ECOG): 0 - Asymptomatic Physical Exam Constitutional:      Appearance: Normal appearance. She is not ill-appearing.  HENT:     Mouth/Throat:     Mouth: Mucous membranes are moist.     Pharynx: Oropharynx is clear. No oropharyngeal exudate or posterior oropharyngeal erythema.  Cardiovascular:     Rate and Rhythm: Normal rate and regular rhythm.     Heart sounds: No murmur heard.    No friction rub. No gallop.  Pulmonary:     Effort: Pulmonary effort is normal. No respiratory distress.     Breath sounds: Normal breath sounds. No wheezing, rhonchi or rales.  Abdominal:     General: Bowel sounds are normal. There is no distension.     Palpations: Abdomen is soft. There is no mass.     Tenderness: There is no abdominal  tenderness.  Musculoskeletal:        General: No swelling.     Right lower leg: No edema.     Left lower leg: No edema.  Lymphadenopathy:     Cervical: No cervical adenopathy.     Upper Body:     Right upper body: No supraclavicular or axillary adenopathy.     Left upper body: No supraclavicular or axillary adenopathy.     Lower Body: No right inguinal adenopathy. No left inguinal adenopathy.  Skin:    General: Skin is warm.     Coloration: Skin is not jaundiced.     Findings: No lesion or rash.  Neurological:     General: No focal deficit present.     Mental Status: She is alert and oriented to person, place, and time. Mental status is at baseline.  Psychiatric:        Mood and Affect: Mood normal.        Behavior: Behavior normal.        Thought Content: Thought content normal.     LABS:      Latest Ref Rng & Units 05/04/2023    2:02 PM 12/29/2022    3:17 PM 09/24/2022   11:24 AM  CBC  WBC 4.0 - 10.5 K/uL 4.5  4.1  4.3   Hemoglobin 12.0 - 15.0 g/dL 11.9  10.2  10.5   Hematocrit 36.0 - 46.0 % 35.4  32.6  33.1   Platelets 150 - 400 K/uL 202  202  180       Latest Ref Rng & Units 05/04/2023    2:02 PM 12/29/2022    3:17 PM 09/24/2022   11:24 AM  CMP  Glucose 70 - 99 mg/dL 161  096  045   BUN 8 - 23 mg/dL 28  22  27    Creatinine 0.44 - 1.00 mg/dL 4.09  8.11  9.14   Sodium 135 - 145 mmol/L 141  140  140   Potassium 3.5 - 5.1 mmol/L 4.6  3.9  3.9   Chloride 98 - 111 mmol/L 104  104  104   CO2 22 - 32 mmol/L 27  26  27    Calcium 8.9 - 10.3 mg/dL 78.2  9.8  9.3   Total Protein 6.5 - 8.1 g/dL 6.8  6.7  6.6   Total Bilirubin 0.0 - 1.2 mg/dL 0.7  0.9  0.5   Alkaline Phos 38 - 126 U/L 116  83  93   AST 15 - 41 U/L 29  24  28    ALT 0 - 44 U/L 19  18  21      Latest Reference Range & Units 05/04/23 14:02  Iron 28 - 170 ug/dL 94  UIBC ug/dL 956  TIBC 213 - 086 ug/dL 578  Saturation Ratios 10.4 - 31.8 % 32 (H)  Ferritin 11 - 307 ng/mL 439 (H)  (H): Data is abnormally  high  ASSESSMENT & PLAN:  Assessment/Plan:  A 78 y.o. female with anemia secondary to chronic renal insufficiency.  I am very pleased with her hemoglobin of 11.9 today, which is the highest it has been since August 2019, when it was 12.1.  Clinically, the patient appears to be doing very well.  As that is the case, I will see her back in 6 months for repeat clinical assessment.  The patient understands all the plans discussed today and is in agreement with them.    Jerime Arif Kirby Funk, MD

## 2023-05-04 NOTE — Progress Notes (Signed)
[facility] Cancer Follow up Visit:  Patient Care Team: Paulina Fusi, MD as PCP - General (Internal Medicine)  CHIEF COMPLAINTS/PURPOSE OF CONSULTATION:  HISTORY OF PRESENTING ILLNESS: Julia Logan 78 y.o. female is here because of anemia  Medical history notable for cervical spondylosis, colon polyps, diverticulitis, GERD, hypertension, coronary artery disease with angioplasty, osteoarthritis/osteoporosis, restless leg syndrome, hyperlipidemia, diabetes mellitus type 2, kidney cancer treated with partial nephrectomy, hysterectomy  November 08 2019:  EGD and esophageal dilation.  Benign appearing 1.3 cm distal esophageal stricture at the GE junction.  The endoscope passed in the stomach with no resistance.  Stomach had multiple polyps in the fundus and body which were biopsied.  There was no evidence of gastric antral vascular ectatic lesions.  A hiatal hernia was present.  Duodenum was normal.  Antral and fundal biopsies were obtained for H. pylori testing. Colonoscopy normal to terminal ileum.  Pathology from the EGD showed fundic gland polyps, no metaplasia, dysplasia or carcinoma.  H pylori negative.   November 08, 2021: Presented to [facility] emergency room following a fall at home.  She was walking back up her steps at home when she "missed stepped" walking up the steps into her house and fell backwards landing with the back of her head hitting the concrete.  She denied loss of consciousness.   CT head Small amount of subarachnoid hemorrhage at the right frontal pole, over the anterior left temporal lobe and in the left Sylvian fissure.  Posterior scalp laceration without skull fracture.  No acute fracture or static subluxation of the cervical spine.  Patient was managed conservatively and discharged home  December 13, 2021: WBC 3.4 hemoglobin 9.4 MCV 92 white count 155; 6626 lymphs 10 monos 3 eos 1 basophil.  Iron saturation 27% CMP notable for glucose of 114  creatinine 1.46 with calculated GFR of 37 sodium 146 albumin 3.9 total protein 5.8.  Hemoglobin A1c 5.7  December 19 2021:  [facility] Hematology Consult Patient states that she has a 6 year history of anemia.   Takes oral iron only MWF due to constipation.  Has not received IV iron/ required PRBC's once in the past for management of post partum hemorrhage.   Has a normal diet.  No history of hemorrhage postoperatively requiring transfusion.     No hematochezia, melena, hemoptysis, hematuria.  No history of intra-articular or soft tissue bleeding  Does not use NSAIDS for pain.  Is not taking oral anticoagulants  Takes baby ASA daily.   No/has history of abnormal bleeding in family members Patient has symptoms of fatigue, pallor,  DOE, decreased performance status.  Patient has PICA to ice.    WBC 2.7 Hgb 9.4 MCV 94 PLT 167; 52 segs 31 lymphs 11 monos 4 eos 2 basophil's.  Tick 2.4% Coombs test negative haptoglobin 186 Vitamin B12 1064.  Folate 6.2 ferritin 501 SPEP with IEP showed no paraprotein.  Serum free kappa 23 lambda 16.7 with a kappa lambda 1.38 IgG 612 IgA 75 IgM 29  January 16 2022:  Reviewed results of labs with patient.  She is fatigued in the afternoon. Discuss use of ESA which included risks and benefits.    January 24 2022:  Procrit 20 000 units   February 26 2022:  Had cataract surgery yesterday.  Less fatigued and more energetic since starting procrit.   WBC 2.9 hemoglobin 10.6 platelet count 190.  56 segs 30 lymphs 9 monos 4 eos 1 basophil Ferritin 418 CMP  notable for glucose 194 creatinine 1.6  April 09, 2022:    Reviewed results of labs with patient.  Feels well.   Ferritin 440 Hgb 9.9  June 09 2022:   Patient asked how long she needed to come here regarding anemia.  Feels well overall except for depression.  Was recently placed on Effexor.  Taking calcium and iron simultaneously.  Instructed her to take them at different times.  Vision improved since cataract  surgery.   Chemistries notable for glucose 229 creatinine 1.47.  Ferritin 427 hemoglobin 10.1  September 24 2022:   Not feeling well today because sugars have been "out of wack" due to alteration in eating habits.  Mood better on Effexor but it has increased her appetite.  Has gained only 2 lbs since last visit.  Taking iron sulfate 325 mg at noon but occasionally misses doses.  Has not received Procrit since November 2023.   Hemoglobin 10.5 Ferritin 429 folate 11.6 B12 1316 Creatinine 1.3  October 10 2022:  CT AP Stable surgical changes related to a prior partial left nephrectomy. No CT findings suspicious for residual or recurrent tumor, locoregional adenopathy or metastatic disease elsewhere.    December 29 2022:  Scheduled follow-up for management of anemia.  Recently had a fall while going upstairs at a restaurant in [facility].  Larey Seat forward; injured right periorbital region and left forearm.  Developed substantial hematomas of both sites.  Was seen in ED and underwent imaging.  No fractures per patient.    Taking iron sulfate 325 mg three times per week.    Hemoglobin 10.2.  Creatinine 1.49 Ferritin 434   Review of Systems  Constitutional: Negative.   HENT:   Negative for mouth sores, sore throat and trouble swallowing.   Eyes:  Negative for icterus.  Respiratory:  Negative for chest tightness, cough and shortness of breath.   Cardiovascular:  Negative for chest pain, leg swelling and palpitations.  Psychiatric/Behavioral:  Positive for depression. The patient is nervous/anxious.     MEDICAL HISTORY: Past Medical History:  Diagnosis Date  . Abnormal nuclear stress test 09/09/2017  . Acid reflux disease 05/05/2017  . Anemia of chronic kidney failure, unspecified stage 01/16/2022  . Atrophic vaginitis 08/14/2016  . Cancer of kidney (HCC) 10/04/2015  . Cervical spondylosis without myelopathy   . Chronic pain 05/05/2017   Overview:   back and right side    . CKD (chronic kidney disease)  stage 3, GFR 30-59 ml/min (HCC) 10/27/2017  . Colon polyps   . Coronary artery disease involving native coronary artery of native heart with angina pectoris (HCC) 01/01/2017  . Diabetes mellitus (HCC) 12/31/2016  . Diverticulitis of colon (without mention of hemorrhage)(562.11)   . ESA (erythropoietin stimulating agent) anemia management patient 05/05/2017  . Esophageal reflux   . Essential hypertension, benign   . Hard of hearing 05/05/2017  . Heart disease   . History of heart artery stent 04/02/2023  . History of kidney cancer   . History of nephrectomy 03/19/2016  . History of recurrent UTI (urinary tract infection) 05/05/2017  . Hyperlipidemia 05/05/2017  . Hypertension 12/31/2016  . Iron deficiency anemia due to chronic blood loss 12/19/2021  . Iron deficiency anemia, unspecified   . Malignant neoplasm of left kidney (HCC) 10/09/2017  . Osteoarthrosis, unspecified whether generalized or localized, shoulder region   . Osteoporosis   . Other and unspecified hyperlipidemia   . Restless legs syndrome (RLS)   . Sciatica, right side 05/05/2017  .  Stable angina pectoris (HCC) 05/11/2017  . Type II or unspecified type diabetes mellitus without mention of complication, not stated as uncontrolled   . Urinary tract infection 09/24/2015    SURGICAL HISTORY: Past Surgical History:  Procedure Laterality Date  . APPENDECTOMY  03/18/1975  . BUNIONECTOMY    . CORONARY BALLOON ANGIOPLASTY N/A 11/09/2017   Procedure: CORONARY BALLOON ANGIOPLASTY;  Surgeon: Swaziland, Peter M, MD;  Location: [facility] INVASIVE CV LAB;  Service: Cardiovascular;  Laterality: N/A;  . CORONARY STENT INTERVENTION N/A 01/01/2017   Procedure: CORONARY STENT INTERVENTION;  Surgeon: Swaziland, Peter M, MD;  Location: [facility] INVASIVE CV LAB;  Service: Cardiovascular;  Laterality: N/A;  . KNEE SURGERY    . LEFT HEART CATH AND CORONARY ANGIOGRAPHY N/A 01/01/2017   Procedure: LEFT HEART CATH AND CORONARY ANGIOGRAPHY;  Surgeon: Swaziland,  Peter M, MD;  Location: [facility] INVASIVE CV LAB;  Service: Cardiovascular;  Laterality: N/A;  . LEFT HEART CATH AND CORONARY ANGIOGRAPHY N/A 11/09/2017   Procedure: LEFT HEART CATH AND CORONARY ANGIOGRAPHY;  Surgeon: Swaziland, Peter M, MD;  Location: [facility] INVASIVE CV LAB;  Service: Cardiovascular;  Laterality: N/A;  . PARTIAL NEPHRECTOMY Left 2017  . VAGINAL HYSTERECTOMY  03/18/1975    SOCIAL HISTORY: Social History   Socioeconomic History  . Marital status: Married    Spouse name: Jerilynn Som  . Number of children: 2  . Years of education: 71  . Highest education level: Master's degree (e.g., MA, MS, MEng, MEd, MSW, MBA)  Occupational History  . Occupation: retired Runner, broadcasting/film/video  Tobacco Use  . Smoking status: Never  . Smokeless tobacco: Never  Vaping Use  . Vaping status: Never Used  Substance and Sexual Activity  . Alcohol use: No  . Drug use: No  . Sexual activity: Not on file  Other Topics Concern  . Not on file  Social History Narrative   Lives at home with husband.   Right-handed.   No daily caffeine use.   Social Drivers of Corporate investment banker Strain: Not on file  Food Insecurity: Low Risk  (11/27/2022)   Received from [facility]   Hunger Vital Sign   . Worried About Programme researcher, broadcasting/film/video in the Last Year: Never true   . Ran Out of Food in the Last Year: Never true  Transportation Needs: No Transportation Needs (11/27/2022)   Received from [facility]   . In the past 12 months, has lack of reliable transportation kept you from medical appointments, meetings, work or from getting things needed for daily living? : No  Physical Activity: Not on file  Stress: Not on file  Social Connections: Unknown (10/08/2021)   Received from [facility]   Social Network   . Social Network: Not on file  Intimate Partner Violence: Not At Risk (12/20/2022)   Received from [facility]   Abuse Screen   . Feels Unsafe at Home or  Work/School: no   . Feels Threatened by Someone: no   . Does Anyone Try to Keep You From Having Contact with Others or Doing Things Outside Your Home?: no   . Physical Signs of Abuse Present: no    FAMILY HISTORY Family History  Problem Relation Age of Onset  . Diabetes Maternal Grandfather   . Heart attack Father   . Diabetes Sister   . Hypertension Sister   . Diabetes Brother   . Hypertension Brother     ALLERGIES:  is allergic to  gabapentin, ticagrelor, and codeine.  MEDICATIONS:  Current Outpatient Medications  Medication Sig Dispense Refill  . aspirin 81 MG tablet Take 81 mg by mouth daily.    Marland Kitchen atorvastatin (LIPITOR) 80 MG tablet Take 1 tablet (80 mg total) by mouth daily. PLease keep appt in January 90 tablet 0  . Calcium Carb-Cholecalciferol (CALCIUM/VITAMIN D PO) Take 1 tablet by mouth daily. 1200-1000 mg-unit    . estradiol (ESTRACE) 0.1 MG/GM vaginal cream Place 1 Applicatorful vaginally 3 (three) times a week.    . ezetimibe (ZETIA) 10 MG tablet Take 10 mg by mouth daily.   3  . Ferrous Sulfate (IRON) 325 (65 Fe) MG TABS Take 325 mg by mouth every other day.    Marland Kitchen glimepiride (AMARYL) 4 MG tablet Take 4 mg by mouth daily with breakfast.   3  . metoprolol tartrate (LOPRESSOR) 25 MG tablet Take 0.5 tablets (12.5 mg total) by mouth 2 (two) times daily. 90 tablet 3  . olmesartan (BENICAR) 20 MG tablet Take 20 mg by mouth daily.    . pantoprazole (PROTONIX) 40 MG tablet Take 40 mg by mouth daily.    . Probiotic Product (PROBIOTIC PO) Take 1 capsule by mouth daily.    . ranolazine (RANEXA) 500 MG 12 hr tablet Take 1 tablet (500 mg total) by mouth 2 (two) times daily. 180 tablet 0  . venlafaxine XR (EFFEXOR-XR) 75 MG 24 hr capsule Take 75 mg by mouth daily.     No current facility-administered medications for this visit.    PHYSICAL EXAMINATION:  ECOG PERFORMANCE STATUS: 1 - Symptomatic but completely ambulatory   There were no vitals filed for this visit.    There  were no vitals filed for this visit.     Physical Exam Vitals and nursing note reviewed.  Constitutional:      General: She is not in acute distress.    Appearance: Normal appearance. She is normal weight. She is not ill-appearing, toxic-appearing or diaphoretic.     Comments: Here alone.  Comfortable  HENT:     Head: Normocephalic and atraumatic.     Right Ear: External ear normal.     Left Ear: External ear normal.     Nose: Nose normal. No congestion or rhinorrhea.  Eyes:     General: No scleral icterus.    Extraocular Movements: Extraocular movements intact.     Conjunctiva/sclera: Conjunctivae normal.     Pupils: Pupils are equal, round, and reactive to light.  Cardiovascular:     Rate and Rhythm: Normal rate and regular rhythm.     Heart sounds: Normal heart sounds. No murmur heard.    No friction rub. No gallop.  Pulmonary:     Effort: Pulmonary effort is normal. No respiratory distress.     Breath sounds: Normal breath sounds. No stridor. No wheezing, rhonchi or rales.  Chest:     Chest wall: No tenderness.  Abdominal:     General: Bowel sounds are normal. There is no distension.     Palpations: Abdomen is soft. There is no mass.     Tenderness: There is no abdominal tenderness. There is no guarding or rebound.     Hernia: No hernia is present.  Musculoskeletal:        General: No swelling, tenderness or deformity.     Cervical back: Normal range of motion and neck supple. No rigidity or tenderness.  Lymphadenopathy:     Head:     Right side of head: No  submental, submandibular, tonsillar, preauricular, posterior auricular or occipital adenopathy.     Left side of head: No submental, submandibular, tonsillar, preauricular, posterior auricular or occipital adenopathy.     Cervical: No cervical adenopathy.     Right cervical: No superficial, deep or posterior cervical adenopathy.    Left cervical: No superficial, deep or posterior cervical adenopathy.     Upper  Body:     Right upper body: No supraclavicular, axillary, pectoral or epitrochlear adenopathy.     Left upper body: No supraclavicular, axillary, pectoral or epitrochlear adenopathy.  Skin:    General: Skin is warm.     Coloration: Skin is not jaundiced.     Findings: No bruising or erythema.     Comments: Less pale  Neurological:     General: No focal deficit present.     Mental Status: She is alert and oriented to person, place, and time. Mental status is at baseline.     Cranial Nerves: No cranial nerve deficit.  Psychiatric:        Mood and Affect: Mood normal.        Behavior: Behavior normal.        Thought Content: Thought content normal.        Judgment: Judgment normal.     LABORATORY DATA: I have personally reviewed the data as listed:  No visits with results within 1 Month(s) from this visit.  Latest known visit with results is:  Appointment on 12/29/2022  Component Date Value Ref Range Status  . Ferritin 12/29/2022 434 (H)  11 - 307 ng/mL Final   Performed at [facility], [address] [place], [place] [address]  . Sodium 12/29/2022 140  135 - 145 mmol/L Final  . Potassium 12/29/2022 3.9  3.5 - 5.1 mmol/L Final  . Chloride 12/29/2022 104  98 - 111 mmol/L Final  . CO2 12/29/2022 26  22 - 32 mmol/L Final  . Glucose, Bld 12/29/2022 115 (H)  70 - 99 mg/dL Final   Glucose reference range applies only to samples taken after fasting for at least 8 hours.  . BUN 12/29/2022 22  8 - 23 mg/dL Final  . Creatinine, Ser 12/29/2022 1.49 (H)  0.44 - 1.00 mg/dL Final  . Calcium 60/45/4098 9.8  8.9 - 10.3 mg/dL Final  . Total Protein 12/29/2022 6.7  6.5 - 8.1 g/dL Final  . Albumin 11/91/4782 3.3 (L)  3.5 - 5.0 g/dL Final  . AST 95/62/1308 24  15 - 41 U/L Final  . ALT 12/29/2022 18  0 - 44 U/L Final  . Alkaline Phosphatase 12/29/2022 83  38 - 126 U/L Final  . Total Bilirubin 12/29/2022 0.9  0.3 - 1.2 mg/dL Final  . GFR, Estimated 12/29/2022 36 (L)  >60  mL/min Final   Comment: (NOTE) Calculated using the CKD-EPI Creatinine Equation (2021)   . Anion gap 12/29/2022 10  5 - 15 Final   Performed at [facility], [address], [place], [place] [address]  . WBC 12/29/2022 4.1  4.0 - 10.5 K/uL Final  . RBC 12/29/2022 3.66 (L)  3.87 - 5.11 MIL/uL Final  . Hemoglobin 12/29/2022 10.2 (L)  12.0 - 15.0 g/dL Final  . HCT 69/62/9528 32.6 (L)  36.0 - 46.0 % Final  . MCV 12/29/2022 89.1  80.0 - 100.0 fL Final  . MCH 12/29/2022 27.9  26.0 - 34.0 pg Final  . MCHC 12/29/2022 31.3  30.0 - 36.0 g/dL Final  . RDW 41/32/4401  13.9  11.5 - 15.5 % Final  . Platelets 12/29/2022 202  150 - 400 K/uL Final  . nRBC 12/29/2022 0.0  0.0 - 0.2 % Final  . Neutrophils Relative % 12/29/2022 57  % Final  . Neutro Abs 12/29/2022 2.4  1.7 - 7.7 K/uL Final  . Lymphocytes Relative 12/29/2022 28  % Final  . Lymphs Abs 12/29/2022 1.1  0.7 - 4.0 K/uL Final  . Monocytes Relative 12/29/2022 9  % Final  . Monocytes Absolute 12/29/2022 0.4  0.1 - 1.0 K/uL Final  . Eosinophils Relative 12/29/2022 4  % Final  . Eosinophils Absolute 12/29/2022 0.2  0.0 - 0.5 K/uL Final  . Basophils Relative 12/29/2022 1  % Final  . Basophils Absolute 12/29/2022 0.0  0.0 - 0.1 K/uL Final  . Immature Granulocytes 12/29/2022 1  % Final  . Abs Immature Granulocytes 12/29/2022 0.02  0.00 - 0.07 K/uL Final   Performed at [facility], [address] [place], [place] [address]    RADIOGRAPHIC STUDIES: I have personally reviewed the radiological images as listed and agree with the findings in the report  No results found.  ASSESSMENT/PLAN  78 y.o.medical history notable for cervical spondylosis, colon polyps, diverticulitis, GERD, hypertension, coronary artery disease with angioplasty, osteoarthritis/osteoporosis, restless leg syndrome, hyperlipidemia, diabetes mellitus type 2, kidney cancer treated with partial nephrectomy, hysterectomy.  Patient has a long  history of anemia  Anemia:  Multifactorial with main culprit being CKD due to prior partial nephrectomy.   Since patient is replete with iron held on IV replacement.  B12 and folate stores are adequate.  Will check CBC with diff today.   January 25 2020:  Received Procrit April 09 2022:  Hgb 9.9 Has not required Procrit since November .   June 09 2022- Hgb 10.1 Cr 1.47 Ferritin 427.  To continue Procrit injections September 24, 2022-ferritin 429 indicating iron stores adequate.  Hemoglobin 10.5 without use of an ESA since November 2023.  Will continue to hold ESA since she is not requiring it and hold on monthly CBCs.  December 29 2022- Hgb 10.2 Ferritin 434.  Iron stores adequate.  No need for ESA at this time.    Anemia of chronic kidney disease:  In CKD patients, EPO levels are inadequately low with respect to the degree of anemia. EPO deficiency starts early in the course of CKD.  When eGFR falls below 30 ml/min per 1.73 m2 this deficiency becomes more severe.   Some CKD patients have EPO resistance, where normal range EPO levels coexist with low hemoglobin (Hb) levels, indicating that the bone marrow response to endogenous and exogenous EPO is blunted.  Therapy Discussed the benefit and risks (stroke, heart attack, heart failure, blood clots, and death)  of ESA's with patient.   Procrit dosing for CKD patients not on dialysis:  Per package insert.   Consider initiating Aranesp treatment only when the hemoglobin level is less than 10 g/dL and the  following considerations apply:  o The rate of hemoglobin decline indicates the likelihood of requiring a RBC transfusion and,  o Reducing the risk of alloimmunization and/or other RBC transfusion-related risks is a goal.  . If the Hgb exceeds 10 g/dL, reduce or interrupt the dose of Procrit and use the lowest dose of Procrit sufficient to reduce the need for RBC transfusions.    Leukopenia:  Mild and will follow.     Cancer Staging  No matching  staging information was found for the patient.  No problem-specific Assessment & Plan notes found for this encounter.   No orders of the defined types were placed in this encounter.  20  minutes was spent in patient care.  This included time spent preparing to see the patient (e.g., review of tests), obtaining and/or reviewing separately obtained history, counseling and educating the patient, ordering tests; documenting clinical information in the electronic or other health record, independently interpreting results and communicating results to the patient as well as coordination of care All questions were answered. The patient knows to call the clinic with any problems, questions or concerns.  20 minutes of face to face time spent with patient  This note was electronically signed.    Weston Settle, MD  05/04/2023 12:13 AM

## 2023-05-12 ENCOUNTER — Other Ambulatory Visit: Payer: Self-pay

## 2023-05-12 ENCOUNTER — Inpatient Hospital Stay: Payer: PPO

## 2023-05-12 DIAGNOSIS — N183 Chronic kidney disease, stage 3 unspecified: Secondary | ICD-10-CM

## 2023-05-12 DIAGNOSIS — C642 Malignant neoplasm of left kidney, except renal pelvis: Secondary | ICD-10-CM

## 2023-05-12 DIAGNOSIS — N189 Chronic kidney disease, unspecified: Secondary | ICD-10-CM | POA: Diagnosis not present

## 2023-05-12 LAB — CMP (CANCER CENTER ONLY)
ALT: 17 U/L (ref 0–44)
AST: 25 U/L (ref 15–41)
Albumin: 3.8 g/dL (ref 3.5–5.0)
Alkaline Phosphatase: 108 U/L (ref 38–126)
Anion gap: 10 (ref 5–15)
BUN: 28 mg/dL — ABNORMAL HIGH (ref 8–23)
CO2: 25 mmol/L (ref 22–32)
Calcium: 10.5 mg/dL — ABNORMAL HIGH (ref 8.9–10.3)
Chloride: 103 mmol/L (ref 98–111)
Creatinine: 1.38 mg/dL — ABNORMAL HIGH (ref 0.44–1.00)
GFR, Estimated: 39 mL/min — ABNORMAL LOW (ref >60)
Glucose, Bld: 255 mg/dL — ABNORMAL HIGH (ref 70–99)
Potassium: 4.6 mmol/L (ref 3.5–5.1)
Sodium: 138 mmol/L (ref 135–145)
Total Bilirubin: 0.6 mg/dL (ref 0.0–1.2)
Total Protein: 6.5 g/dL (ref 6.5–8.1)

## 2023-05-20 DIAGNOSIS — J101 Influenza due to other identified influenza virus with other respiratory manifestations: Secondary | ICD-10-CM | POA: Diagnosis not present

## 2023-05-20 DIAGNOSIS — R509 Fever, unspecified: Secondary | ICD-10-CM | POA: Diagnosis not present

## 2023-05-30 ENCOUNTER — Other Ambulatory Visit: Payer: Self-pay | Admitting: Cardiology

## 2023-06-01 NOTE — Telephone Encounter (Signed)
 Rx refill sent to pharmacy.

## 2023-06-04 DIAGNOSIS — Z974 Presence of external hearing-aid: Secondary | ICD-10-CM | POA: Diagnosis not present

## 2023-06-04 DIAGNOSIS — H903 Sensorineural hearing loss, bilateral: Secondary | ICD-10-CM | POA: Diagnosis not present

## 2023-07-22 DIAGNOSIS — B9689 Other specified bacterial agents as the cause of diseases classified elsewhere: Secondary | ICD-10-CM | POA: Diagnosis not present

## 2023-07-22 DIAGNOSIS — J019 Acute sinusitis, unspecified: Secondary | ICD-10-CM | POA: Diagnosis not present

## 2023-07-22 DIAGNOSIS — J208 Acute bronchitis due to other specified organisms: Secondary | ICD-10-CM | POA: Diagnosis not present

## 2023-07-22 DIAGNOSIS — M7701 Medial epicondylitis, right elbow: Secondary | ICD-10-CM | POA: Diagnosis not present

## 2023-07-27 DIAGNOSIS — N1832 Chronic kidney disease, stage 3b: Secondary | ICD-10-CM | POA: Diagnosis not present

## 2023-07-27 DIAGNOSIS — E785 Hyperlipidemia, unspecified: Secondary | ICD-10-CM | POA: Diagnosis not present

## 2023-07-27 DIAGNOSIS — I2510 Atherosclerotic heart disease of native coronary artery without angina pectoris: Secondary | ICD-10-CM | POA: Diagnosis not present

## 2023-07-27 DIAGNOSIS — N183 Chronic kidney disease, stage 3 unspecified: Secondary | ICD-10-CM | POA: Diagnosis not present

## 2023-07-27 DIAGNOSIS — F320 Major depressive disorder, single episode, mild: Secondary | ICD-10-CM | POA: Diagnosis not present

## 2023-07-27 DIAGNOSIS — I129 Hypertensive chronic kidney disease with stage 1 through stage 4 chronic kidney disease, or unspecified chronic kidney disease: Secondary | ICD-10-CM | POA: Diagnosis not present

## 2023-07-27 DIAGNOSIS — D509 Iron deficiency anemia, unspecified: Secondary | ICD-10-CM | POA: Diagnosis not present

## 2023-07-27 DIAGNOSIS — R809 Proteinuria, unspecified: Secondary | ICD-10-CM | POA: Diagnosis not present

## 2023-07-27 DIAGNOSIS — Z9861 Coronary angioplasty status: Secondary | ICD-10-CM | POA: Diagnosis not present

## 2023-07-27 DIAGNOSIS — D631 Anemia in chronic kidney disease: Secondary | ICD-10-CM | POA: Diagnosis not present

## 2023-07-27 DIAGNOSIS — E1129 Type 2 diabetes mellitus with other diabetic kidney complication: Secondary | ICD-10-CM | POA: Diagnosis not present

## 2023-08-15 DIAGNOSIS — E1129 Type 2 diabetes mellitus with other diabetic kidney complication: Secondary | ICD-10-CM | POA: Diagnosis not present

## 2023-08-15 DIAGNOSIS — I10 Essential (primary) hypertension: Secondary | ICD-10-CM | POA: Diagnosis not present

## 2023-08-15 DIAGNOSIS — F320 Major depressive disorder, single episode, mild: Secondary | ICD-10-CM | POA: Diagnosis not present

## 2023-09-14 DIAGNOSIS — E1129 Type 2 diabetes mellitus with other diabetic kidney complication: Secondary | ICD-10-CM | POA: Diagnosis not present

## 2023-09-14 DIAGNOSIS — F320 Major depressive disorder, single episode, mild: Secondary | ICD-10-CM | POA: Diagnosis not present

## 2023-09-14 DIAGNOSIS — I10 Essential (primary) hypertension: Secondary | ICD-10-CM | POA: Diagnosis not present

## 2023-09-17 DIAGNOSIS — C642 Malignant neoplasm of left kidney, except renal pelvis: Secondary | ICD-10-CM | POA: Diagnosis not present

## 2023-10-07 DIAGNOSIS — H35342 Macular cyst, hole, or pseudohole, left eye: Secondary | ICD-10-CM | POA: Diagnosis not present

## 2023-10-07 DIAGNOSIS — E119 Type 2 diabetes mellitus without complications: Secondary | ICD-10-CM | POA: Diagnosis not present

## 2023-10-07 DIAGNOSIS — H5213 Myopia, bilateral: Secondary | ICD-10-CM | POA: Diagnosis not present

## 2023-10-07 DIAGNOSIS — Z9849 Cataract extraction status, unspecified eye: Secondary | ICD-10-CM | POA: Diagnosis not present

## 2023-10-07 DIAGNOSIS — Z961 Presence of intraocular lens: Secondary | ICD-10-CM | POA: Diagnosis not present

## 2023-10-07 DIAGNOSIS — H524 Presbyopia: Secondary | ICD-10-CM | POA: Diagnosis not present

## 2023-10-07 DIAGNOSIS — H52223 Regular astigmatism, bilateral: Secondary | ICD-10-CM | POA: Diagnosis not present

## 2023-10-15 ENCOUNTER — Telehealth: Payer: Self-pay | Admitting: Oncology

## 2023-10-15 DIAGNOSIS — E1129 Type 2 diabetes mellitus with other diabetic kidney complication: Secondary | ICD-10-CM | POA: Diagnosis not present

## 2023-10-15 DIAGNOSIS — F320 Major depressive disorder, single episode, mild: Secondary | ICD-10-CM | POA: Diagnosis not present

## 2023-10-15 NOTE — Telephone Encounter (Signed)
 10/15/23 left msg appt rescheduled to 11/05/23 arrive at 145pm.

## 2023-10-28 DIAGNOSIS — F320 Major depressive disorder, single episode, mild: Secondary | ICD-10-CM | POA: Diagnosis not present

## 2023-10-28 DIAGNOSIS — R809 Proteinuria, unspecified: Secondary | ICD-10-CM | POA: Diagnosis not present

## 2023-10-28 DIAGNOSIS — I129 Hypertensive chronic kidney disease with stage 1 through stage 4 chronic kidney disease, or unspecified chronic kidney disease: Secondary | ICD-10-CM | POA: Diagnosis not present

## 2023-10-28 DIAGNOSIS — D631 Anemia in chronic kidney disease: Secondary | ICD-10-CM | POA: Diagnosis not present

## 2023-10-28 DIAGNOSIS — E785 Hyperlipidemia, unspecified: Secondary | ICD-10-CM | POA: Diagnosis not present

## 2023-10-28 DIAGNOSIS — Z9861 Coronary angioplasty status: Secondary | ICD-10-CM | POA: Diagnosis not present

## 2023-10-28 DIAGNOSIS — N1832 Chronic kidney disease, stage 3b: Secondary | ICD-10-CM | POA: Diagnosis not present

## 2023-10-28 DIAGNOSIS — Z1231 Encounter for screening mammogram for malignant neoplasm of breast: Secondary | ICD-10-CM | POA: Diagnosis not present

## 2023-10-28 DIAGNOSIS — E1129 Type 2 diabetes mellitus with other diabetic kidney complication: Secondary | ICD-10-CM | POA: Diagnosis not present

## 2023-10-28 DIAGNOSIS — N183 Chronic kidney disease, stage 3 unspecified: Secondary | ICD-10-CM | POA: Diagnosis not present

## 2023-10-28 DIAGNOSIS — I2510 Atherosclerotic heart disease of native coronary artery without angina pectoris: Secondary | ICD-10-CM | POA: Diagnosis not present

## 2023-10-28 DIAGNOSIS — D509 Iron deficiency anemia, unspecified: Secondary | ICD-10-CM | POA: Diagnosis not present

## 2023-11-02 ENCOUNTER — Inpatient Hospital Stay: Payer: PPO

## 2023-11-02 ENCOUNTER — Inpatient Hospital Stay: Payer: PPO | Admitting: Oncology

## 2023-11-04 NOTE — Progress Notes (Unsigned)
 [facility] at [facility] [address] [place],  [place]  [address] [phone]  Clinic Day:  11/05/2023  Referring physician: Keren [name], MD   HISTORY OF PRESENT ILLNESS:  The patient is a 78 y.o. female with anemia that appears to be related to chronic renal insufficiency.  Of note, she has had a partial left nephrectomy in 2017 for kidney cancer. Despite this diagnosis, the patient has only had one dose of Retacrit  for her anemia, which was back in November 2023.  She comes in today for routine follow-up.  Since her last visit, the patient has been doing well.  She denies having increased fatigue or any overt forms of blood loss which concern her for progressive anemia.    PHYSICAL EXAM:  Blood pressure (!) 128/55, pulse (!) 55, temperature 98 F (36.7 C), temperature source Oral, resp. rate 16, height 5' 1 (1.549 m), weight 164 lb 8 oz (74.6 kg), SpO2 96%. Wt Readings from Last 3 Encounters:  11/05/23 164 lb 8 oz (74.6 kg)  05/04/23 166 lb 9.6 oz (75.6 kg)  04/03/23 168 lb 3.2 oz (76.3 kg)   Body mass index is 31.08 kg/m. Performance status (ECOG): 0 - Asymptomatic Physical Exam Constitutional:      Appearance: Normal appearance. She is not ill-appearing.  HENT:     Mouth/Throat:     Mouth: Mucous membranes are moist.     Pharynx: Oropharynx is clear. No oropharyngeal exudate or posterior oropharyngeal erythema.  Cardiovascular:     Rate and Rhythm: Normal rate and regular rhythm.     Heart sounds: No murmur heard.    No friction rub. No gallop.  Pulmonary:     Effort: Pulmonary effort is normal. No respiratory distress.     Breath sounds: Normal breath sounds. No wheezing, rhonchi or rales.  Abdominal:     General: Bowel sounds are normal. There is no distension.     Palpations: Abdomen is soft. There is no mass.     Tenderness: There is no abdominal tenderness.  Musculoskeletal:        General: No swelling.     Right lower leg: No edema.      Left lower leg: No edema.  Lymphadenopathy:     Cervical: No cervical adenopathy.     Upper Body:     Right upper body: No supraclavicular or axillary adenopathy.     Left upper body: No supraclavicular or axillary adenopathy.     Lower Body: No right inguinal adenopathy. No left inguinal adenopathy.  Skin:    General: Skin is warm.     Coloration: Skin is not jaundiced.     Findings: No lesion or rash.  Neurological:     General: No focal deficit present.     Mental Status: She is alert and oriented to person, place, and time. Mental status is at baseline.  Psychiatric:        Mood and Affect: Mood normal.        Behavior: Behavior normal.        Thought Content: Thought content normal.     LABS:      Latest Ref Rng & Units 11/05/2023    1:52 PM 05/04/2023    2:02 PM 12/29/2022    3:17 PM  CBC  WBC 4.0 - 10.5 K/uL 4.6  4.5  4.1   Hemoglobin 12.0 - 15.0 g/dL 89.0  88.0  89.7   Hematocrit 36.0 - 46.0 % 33.7  35.4  32.6   Platelets 150 - 400 K/uL 176  202  202       Latest Ref Rng & Units 11/05/2023    1:52 PM 05/12/2023   11:13 AM 05/04/2023    2:02 PM  CMP  Glucose 70 - 99 mg/dL 762  744  823   BUN 8 - 23 mg/dL 34  28  28   Creatinine 0.44 - 1.00 mg/dL 8.36  8.61  8.33   Sodium 135 - 145 mmol/L 137  138  141   Potassium 3.5 - 5.1 mmol/L 4.2  4.6  4.6   Chloride 98 - 111 mmol/L 102  103  104   CO2 22 - 32 mmol/L 22  25  27    Calcium  8.9 - 10.3 mg/dL 89.7  89.4  89.2   Total Protein 6.5 - 8.1 g/dL 6.5  6.5  6.8   Total Bilirubin 0.0 - 1.2 mg/dL 0.8  0.6  0.7   Alkaline Phos 38 - 126 U/L 98  108  116   AST 15 - 41 U/L 33  25  29   ALT 0 - 44 U/L 25  17  19      Latest Reference Range & Units 11/05/23 13:52  Iron 28 - 170 ug/dL 63  UIBC ug/dL 790  TIBC 749 - 549 ug/dL 727  Saturation Ratios 10.4 - 31.8 % 23  Ferritin 11 - 307 ng/mL 446 (H)  (H): Data is abnormally high  ASSESSMENT & PLAN:  Assessment/Plan:  A 78 y.o. female with anemia secondary to chronic  renal insufficiency.  As her hemoglobin remains above 10, her anemia will continue to be followed conservatively.  Once again, iron levels done today showed no evidence of iron deficiency anemia.  Her labs also show that her renal insufficiency has not significantly changed.  Clinically, the patient appears to be doing well.  As that is the case, I will see her back in 6 months for repeat clinical assessment.  The patient understands all the plans discussed today and is in agreement with them.    Tuan Tippin DELENA Kerns, MD

## 2023-11-05 ENCOUNTER — Telehealth: Payer: Self-pay | Admitting: Oncology

## 2023-11-05 ENCOUNTER — Inpatient Hospital Stay (HOSPITAL_BASED_OUTPATIENT_CLINIC_OR_DEPARTMENT_OTHER): Admitting: Oncology

## 2023-11-05 ENCOUNTER — Inpatient Hospital Stay: Attending: Oncology

## 2023-11-05 ENCOUNTER — Other Ambulatory Visit: Payer: Self-pay | Admitting: Oncology

## 2023-11-05 VITALS — BP 128/55 | HR 55 | Temp 98.0°F | Resp 16 | Ht 61.0 in | Wt 164.5 lb

## 2023-11-05 DIAGNOSIS — Z85528 Personal history of other malignant neoplasm of kidney: Secondary | ICD-10-CM | POA: Diagnosis not present

## 2023-11-05 DIAGNOSIS — Z905 Acquired absence of kidney: Secondary | ICD-10-CM | POA: Insufficient documentation

## 2023-11-05 DIAGNOSIS — N183 Chronic kidney disease, stage 3 unspecified: Secondary | ICD-10-CM

## 2023-11-05 DIAGNOSIS — N189 Chronic kidney disease, unspecified: Secondary | ICD-10-CM | POA: Insufficient documentation

## 2023-11-05 DIAGNOSIS — D631 Anemia in chronic kidney disease: Secondary | ICD-10-CM | POA: Insufficient documentation

## 2023-11-05 LAB — CBC WITH DIFFERENTIAL (CANCER CENTER ONLY)
Abs Immature Granulocytes: 0.02 K/uL (ref 0.00–0.07)
Basophils Absolute: 0 K/uL (ref 0.0–0.1)
Basophils Relative: 1 %
Eosinophils Absolute: 0.1 K/uL (ref 0.0–0.5)
Eosinophils Relative: 3 %
HCT: 33.7 % — ABNORMAL LOW (ref 36.0–46.0)
Hemoglobin: 10.9 g/dL — ABNORMAL LOW (ref 12.0–15.0)
Immature Granulocytes: 0 %
Lymphocytes Relative: 25 %
Lymphs Abs: 1.1 K/uL (ref 0.7–4.0)
MCH: 28.3 pg (ref 26.0–34.0)
MCHC: 32.3 g/dL (ref 30.0–36.0)
MCV: 87.5 fL (ref 80.0–100.0)
Monocytes Absolute: 0.4 K/uL (ref 0.1–1.0)
Monocytes Relative: 9 %
Neutro Abs: 2.9 K/uL (ref 1.7–7.7)
Neutrophils Relative %: 62 %
Platelet Count: 176 K/uL (ref 150–400)
RBC: 3.85 MIL/uL — ABNORMAL LOW (ref 3.87–5.11)
RDW: 14.3 % (ref 11.5–15.5)
WBC Count: 4.6 K/uL (ref 4.0–10.5)
nRBC: 0 % (ref 0.0–0.2)

## 2023-11-05 LAB — CMP (CANCER CENTER ONLY)
ALT: 25 U/L (ref 0–44)
AST: 33 U/L (ref 15–41)
Albumin: 3.9 g/dL (ref 3.5–5.0)
Alkaline Phosphatase: 98 U/L (ref 38–126)
Anion gap: 13 (ref 5–15)
BUN: 34 mg/dL — ABNORMAL HIGH (ref 8–23)
CO2: 22 mmol/L (ref 22–32)
Calcium: 10.2 mg/dL (ref 8.9–10.3)
Chloride: 102 mmol/L (ref 98–111)
Creatinine: 1.63 mg/dL — ABNORMAL HIGH (ref 0.44–1.00)
GFR, Estimated: 32 mL/min — ABNORMAL LOW (ref >60)
Glucose, Bld: 237 mg/dL — ABNORMAL HIGH (ref 70–99)
Potassium: 4.2 mmol/L (ref 3.5–5.1)
Sodium: 137 mmol/L (ref 135–145)
Total Bilirubin: 0.8 mg/dL (ref 0.0–1.2)
Total Protein: 6.5 g/dL (ref 6.5–8.1)

## 2023-11-05 LAB — IRON AND TIBC
Iron: 63 ug/dL (ref 28–170)
Saturation Ratios: 23 % (ref 10.4–31.8)
TIBC: 272 ug/dL (ref 250–450)
UIBC: 209 ug/dL

## 2023-11-05 LAB — FERRITIN: Ferritin: 446 ng/mL — ABNORMAL HIGH (ref 11–307)

## 2023-11-05 NOTE — Telephone Encounter (Signed)
 Patient has been scheduled for follow-up visit per 11/04/23 LOS.  Pt given an appt calendar with date and time.

## 2023-11-12 DIAGNOSIS — Z8719 Personal history of other diseases of the digestive system: Secondary | ICD-10-CM | POA: Diagnosis not present

## 2023-11-12 DIAGNOSIS — K222 Esophageal obstruction: Secondary | ICD-10-CM | POA: Diagnosis not present

## 2023-11-26 DIAGNOSIS — K5900 Constipation, unspecified: Secondary | ICD-10-CM | POA: Diagnosis not present

## 2023-11-26 DIAGNOSIS — K529 Noninfective gastroenteritis and colitis, unspecified: Secondary | ICD-10-CM | POA: Diagnosis not present

## 2023-11-26 DIAGNOSIS — I10 Essential (primary) hypertension: Secondary | ICD-10-CM | POA: Diagnosis not present

## 2023-11-26 DIAGNOSIS — R1031 Right lower quadrant pain: Secondary | ICD-10-CM | POA: Diagnosis not present

## 2023-11-26 DIAGNOSIS — Z7984 Long term (current) use of oral hypoglycemic drugs: Secondary | ICD-10-CM | POA: Diagnosis not present

## 2023-11-26 DIAGNOSIS — Z7982 Long term (current) use of aspirin: Secondary | ICD-10-CM | POA: Diagnosis not present

## 2023-11-26 DIAGNOSIS — Z79891 Long term (current) use of opiate analgesic: Secondary | ICD-10-CM | POA: Diagnosis not present

## 2023-11-26 DIAGNOSIS — E1165 Type 2 diabetes mellitus with hyperglycemia: Secondary | ICD-10-CM | POA: Diagnosis not present

## 2023-11-28 ENCOUNTER — Other Ambulatory Visit: Payer: Self-pay | Admitting: Cardiology

## 2023-12-03 DIAGNOSIS — R1310 Dysphagia, unspecified: Secondary | ICD-10-CM | POA: Diagnosis not present

## 2023-12-15 DIAGNOSIS — E1129 Type 2 diabetes mellitus with other diabetic kidney complication: Secondary | ICD-10-CM | POA: Diagnosis not present

## 2023-12-15 DIAGNOSIS — I129 Hypertensive chronic kidney disease with stage 1 through stage 4 chronic kidney disease, or unspecified chronic kidney disease: Secondary | ICD-10-CM | POA: Diagnosis not present

## 2023-12-15 DIAGNOSIS — F320 Major depressive disorder, single episode, mild: Secondary | ICD-10-CM | POA: Diagnosis not present

## 2023-12-15 DIAGNOSIS — N1832 Chronic kidney disease, stage 3b: Secondary | ICD-10-CM | POA: Diagnosis not present

## 2023-12-26 DIAGNOSIS — S3992XA Unspecified injury of lower back, initial encounter: Secondary | ICD-10-CM | POA: Diagnosis not present

## 2023-12-26 DIAGNOSIS — W19XXXA Unspecified fall, initial encounter: Secondary | ICD-10-CM | POA: Diagnosis not present

## 2023-12-26 DIAGNOSIS — R519 Headache, unspecified: Secondary | ICD-10-CM | POA: Diagnosis not present

## 2023-12-26 DIAGNOSIS — W010XXA Fall on same level from slipping, tripping and stumbling without subsequent striking against object, initial encounter: Secondary | ICD-10-CM | POA: Diagnosis not present

## 2023-12-26 DIAGNOSIS — R609 Edema, unspecified: Secondary | ICD-10-CM | POA: Diagnosis not present

## 2023-12-26 DIAGNOSIS — Z043 Encounter for examination and observation following other accident: Secondary | ICD-10-CM | POA: Diagnosis not present

## 2023-12-26 DIAGNOSIS — I10 Essential (primary) hypertension: Secondary | ICD-10-CM | POA: Diagnosis not present

## 2023-12-26 DIAGNOSIS — M545 Low back pain, unspecified: Secondary | ICD-10-CM | POA: Diagnosis not present

## 2023-12-31 DIAGNOSIS — J4531 Mild persistent asthma with (acute) exacerbation: Secondary | ICD-10-CM | POA: Diagnosis not present

## 2023-12-31 DIAGNOSIS — G8911 Acute pain due to trauma: Secondary | ICD-10-CM | POA: Diagnosis not present

## 2023-12-31 DIAGNOSIS — M545 Low back pain, unspecified: Secondary | ICD-10-CM | POA: Diagnosis not present

## 2023-12-31 DIAGNOSIS — W010XXA Fall on same level from slipping, tripping and stumbling without subsequent striking against object, initial encounter: Secondary | ICD-10-CM | POA: Diagnosis not present

## 2023-12-31 DIAGNOSIS — F0781 Postconcussional syndrome: Secondary | ICD-10-CM | POA: Diagnosis not present

## 2023-12-31 DIAGNOSIS — G44319 Acute post-traumatic headache, not intractable: Secondary | ICD-10-CM | POA: Diagnosis not present

## 2024-01-06 DIAGNOSIS — K317 Polyp of stomach and duodenum: Secondary | ICD-10-CM | POA: Diagnosis not present

## 2024-01-06 DIAGNOSIS — Z955 Presence of coronary angioplasty implant and graft: Secondary | ICD-10-CM | POA: Diagnosis not present

## 2024-01-06 DIAGNOSIS — K219 Gastro-esophageal reflux disease without esophagitis: Secondary | ICD-10-CM | POA: Diagnosis not present

## 2024-01-06 DIAGNOSIS — Z85528 Personal history of other malignant neoplasm of kidney: Secondary | ICD-10-CM | POA: Diagnosis not present

## 2024-01-06 DIAGNOSIS — I129 Hypertensive chronic kidney disease with stage 1 through stage 4 chronic kidney disease, or unspecified chronic kidney disease: Secondary | ICD-10-CM | POA: Diagnosis not present

## 2024-01-06 DIAGNOSIS — Z905 Acquired absence of kidney: Secondary | ICD-10-CM | POA: Diagnosis not present

## 2024-01-06 DIAGNOSIS — K222 Esophageal obstruction: Secondary | ICD-10-CM | POA: Diagnosis not present

## 2024-01-06 DIAGNOSIS — I252 Old myocardial infarction: Secondary | ICD-10-CM | POA: Diagnosis not present

## 2024-01-06 DIAGNOSIS — E669 Obesity, unspecified: Secondary | ICD-10-CM | POA: Diagnosis not present

## 2024-01-06 DIAGNOSIS — Z6831 Body mass index (BMI) 31.0-31.9, adult: Secondary | ICD-10-CM | POA: Diagnosis not present

## 2024-01-06 DIAGNOSIS — Z886 Allergy status to analgesic agent status: Secondary | ICD-10-CM | POA: Diagnosis not present

## 2024-01-06 DIAGNOSIS — F419 Anxiety disorder, unspecified: Secondary | ICD-10-CM | POA: Diagnosis not present

## 2024-01-06 DIAGNOSIS — Z881 Allergy status to other antibiotic agents status: Secondary | ICD-10-CM | POA: Diagnosis not present

## 2024-01-06 DIAGNOSIS — K449 Diaphragmatic hernia without obstruction or gangrene: Secondary | ICD-10-CM | POA: Diagnosis not present

## 2024-01-06 DIAGNOSIS — E785 Hyperlipidemia, unspecified: Secondary | ICD-10-CM | POA: Diagnosis not present

## 2024-01-06 DIAGNOSIS — N183 Chronic kidney disease, stage 3 unspecified: Secondary | ICD-10-CM | POA: Diagnosis not present

## 2024-01-06 DIAGNOSIS — Z885 Allergy status to narcotic agent status: Secondary | ICD-10-CM | POA: Diagnosis not present

## 2024-01-06 DIAGNOSIS — I2510 Atherosclerotic heart disease of native coronary artery without angina pectoris: Secondary | ICD-10-CM | POA: Diagnosis not present

## 2024-01-14 DIAGNOSIS — H60391 Other infective otitis externa, right ear: Secondary | ICD-10-CM | POA: Diagnosis not present

## 2024-01-14 DIAGNOSIS — K146 Glossodynia: Secondary | ICD-10-CM | POA: Diagnosis not present

## 2024-01-14 DIAGNOSIS — R1310 Dysphagia, unspecified: Secondary | ICD-10-CM | POA: Diagnosis not present

## 2024-01-15 DIAGNOSIS — I129 Hypertensive chronic kidney disease with stage 1 through stage 4 chronic kidney disease, or unspecified chronic kidney disease: Secondary | ICD-10-CM | POA: Diagnosis not present

## 2024-01-15 DIAGNOSIS — E1129 Type 2 diabetes mellitus with other diabetic kidney complication: Secondary | ICD-10-CM | POA: Diagnosis not present

## 2024-01-26 DIAGNOSIS — K449 Diaphragmatic hernia without obstruction or gangrene: Secondary | ICD-10-CM | POA: Diagnosis not present

## 2024-01-26 DIAGNOSIS — R1310 Dysphagia, unspecified: Secondary | ICD-10-CM | POA: Diagnosis not present

## 2024-01-26 DIAGNOSIS — K219 Gastro-esophageal reflux disease without esophagitis: Secondary | ICD-10-CM | POA: Diagnosis not present

## 2024-01-28 ENCOUNTER — Other Ambulatory Visit: Payer: Self-pay | Admitting: Cardiology

## 2024-02-03 DIAGNOSIS — E1129 Type 2 diabetes mellitus with other diabetic kidney complication: Secondary | ICD-10-CM | POA: Diagnosis not present

## 2024-02-03 DIAGNOSIS — N183 Chronic kidney disease, stage 3 unspecified: Secondary | ICD-10-CM | POA: Diagnosis not present

## 2024-02-03 DIAGNOSIS — I2510 Atherosclerotic heart disease of native coronary artery without angina pectoris: Secondary | ICD-10-CM | POA: Diagnosis not present

## 2024-02-03 DIAGNOSIS — E785 Hyperlipidemia, unspecified: Secondary | ICD-10-CM | POA: Diagnosis not present

## 2024-02-03 DIAGNOSIS — I129 Hypertensive chronic kidney disease with stage 1 through stage 4 chronic kidney disease, or unspecified chronic kidney disease: Secondary | ICD-10-CM | POA: Diagnosis not present

## 2024-02-03 DIAGNOSIS — F320 Major depressive disorder, single episode, mild: Secondary | ICD-10-CM | POA: Diagnosis not present

## 2024-02-03 DIAGNOSIS — Z9861 Coronary angioplasty status: Secondary | ICD-10-CM | POA: Diagnosis not present

## 2024-02-03 DIAGNOSIS — D631 Anemia in chronic kidney disease: Secondary | ICD-10-CM | POA: Diagnosis not present

## 2024-02-03 DIAGNOSIS — N1832 Chronic kidney disease, stage 3b: Secondary | ICD-10-CM | POA: Diagnosis not present

## 2024-02-03 DIAGNOSIS — R1310 Dysphagia, unspecified: Secondary | ICD-10-CM | POA: Diagnosis not present

## 2024-02-03 DIAGNOSIS — R809 Proteinuria, unspecified: Secondary | ICD-10-CM | POA: Diagnosis not present

## 2024-02-03 DIAGNOSIS — D509 Iron deficiency anemia, unspecified: Secondary | ICD-10-CM | POA: Diagnosis not present

## 2024-02-22 DIAGNOSIS — K449 Diaphragmatic hernia without obstruction or gangrene: Secondary | ICD-10-CM | POA: Diagnosis not present

## 2024-02-22 DIAGNOSIS — M26629 Arthralgia of temporomandibular joint, unspecified side: Secondary | ICD-10-CM | POA: Diagnosis not present

## 2024-02-22 DIAGNOSIS — K1379 Other lesions of oral mucosa: Secondary | ICD-10-CM | POA: Diagnosis not present

## 2024-02-22 DIAGNOSIS — K222 Esophageal obstruction: Secondary | ICD-10-CM | POA: Diagnosis not present

## 2024-02-22 DIAGNOSIS — R1310 Dysphagia, unspecified: Secondary | ICD-10-CM | POA: Diagnosis not present

## 2024-02-28 ENCOUNTER — Other Ambulatory Visit: Payer: Self-pay | Admitting: Cardiology

## 2024-03-09 ENCOUNTER — Other Ambulatory Visit: Payer: Self-pay | Admitting: Cardiology

## 2024-03-15 NOTE — Progress Notes (Signed)
" °  Rapid Diagnostic Service for Malignancies [facility]  Diagnostic Nurse Navigator Telephone Note:  Initial Encounter  03/15/2024  Patient Name:  Julia Logan Patient MRN:  [id number] Patient DOB:  04-09-1945   I contacted Julia Logan regarding their hematology/oncology referral from Dr. Keren for purpose of evaluation and work up of tongue and tonsil mass.  I introduced myself by phone and confirmed the patient's identity using two identifiers; introduced myself, my role, and reason for contact.  Julia Logan was aware of her referral and reason.  Information on reason for referral was not necessary.  The Rapid Diagnostic Service for Malignancy (RDS) was explained to the patient, including the intent being to complete a diagnostic/prognostic plan of care for the patient's findings concerning for cancer after a face-to-face visit and physical assessment in clinic by the [facility] APP and/or the supervising physician.  Pertinent questions were addressed to the patient's satisfaction at this time.    The patient is aware of RDS appointment details and of the intent of the visit(s).  Care coordination during this diagnostic process will be performed by the RDS's Diagnostic Navigator.  We look forward to meeting Julia Logan to complete her diagnostic work-up and coordination of subsequent care post-diagnosis as appropriate.  Rapid Diagnostic Service Care Team Info:  Best contact/way to contact patient:  home  CHL updated to reflect?:  yes Is there a HC POA?:  No [facility] Transportation Screen completed:  Yes Electronic Welcome Letter reviewed and sent:  Yes  Rapid Diagnostic Clinic Appointment Details Provided at Time of Call: Appt Provider:  Eleanor Bach NP Appt Date:  03/22/23 Appt Time:  900 Comment:   Referral Investigation: CT 03/08/24- suspicious for malignancy   Diagnostic Navigator:  Julia DELENA An, RN                                     [facility]  "

## 2024-03-21 ENCOUNTER — Other Ambulatory Visit: Payer: Self-pay | Admitting: Hematology and Oncology

## 2024-03-21 ENCOUNTER — Inpatient Hospital Stay

## 2024-03-21 ENCOUNTER — Inpatient Hospital Stay: Attending: Hematology and Oncology | Admitting: Hematology and Oncology

## 2024-03-21 ENCOUNTER — Other Ambulatory Visit: Payer: Self-pay

## 2024-03-21 ENCOUNTER — Encounter: Payer: Self-pay | Admitting: Hematology and Oncology

## 2024-03-21 VITALS — BP 169/64 | HR 61 | Temp 98.6°F | Resp 16 | Ht 61.0 in | Wt 147.7 lb

## 2024-03-21 DIAGNOSIS — N183 Chronic kidney disease, stage 3 unspecified: Secondary | ICD-10-CM | POA: Diagnosis not present

## 2024-03-21 DIAGNOSIS — Z905 Acquired absence of kidney: Secondary | ICD-10-CM | POA: Diagnosis not present

## 2024-03-21 DIAGNOSIS — D631 Anemia in chronic kidney disease: Secondary | ICD-10-CM

## 2024-03-21 DIAGNOSIS — Z85528 Personal history of other malignant neoplasm of kidney: Secondary | ICD-10-CM | POA: Diagnosis not present

## 2024-03-21 LAB — CBC WITH DIFFERENTIAL (CANCER CENTER ONLY)
Abs Immature Granulocytes: 0.01 K/uL (ref 0.00–0.07)
Basophils Absolute: 0 K/uL (ref 0.0–0.1)
Basophils Relative: 0 %
Eosinophils Absolute: 0.1 K/uL (ref 0.0–0.5)
Eosinophils Relative: 2 %
HCT: 31.7 % — ABNORMAL LOW (ref 36.0–46.0)
Hemoglobin: 10.5 g/dL — ABNORMAL LOW (ref 12.0–15.0)
Immature Granulocytes: 0 %
Lymphocytes Relative: 25 %
Lymphs Abs: 1 K/uL (ref 0.7–4.0)
MCH: 28.4 pg (ref 26.0–34.0)
MCHC: 33.1 g/dL (ref 30.0–36.0)
MCV: 85.7 fL (ref 80.0–100.0)
Monocytes Absolute: 0.3 K/uL (ref 0.1–1.0)
Monocytes Relative: 7 %
Neutro Abs: 2.6 K/uL (ref 1.7–7.7)
Neutrophils Relative %: 66 %
Platelet Count: 183 K/uL (ref 150–400)
RBC: 3.7 MIL/uL — ABNORMAL LOW (ref 3.87–5.11)
RDW: 14.3 % (ref 11.5–15.5)
WBC Count: 3.8 K/uL — ABNORMAL LOW (ref 4.0–10.5)
nRBC: 0 % (ref 0.0–0.2)

## 2024-03-21 LAB — CMP (CANCER CENTER ONLY)
ALT: 25 U/L (ref 0–44)
AST: 54 U/L — ABNORMAL HIGH (ref 15–41)
Albumin: 3.9 g/dL (ref 3.5–5.0)
Alkaline Phosphatase: 78 U/L (ref 38–126)
Anion gap: 9 (ref 5–15)
BUN: 14 mg/dL (ref 8–23)
CO2: 25 mmol/L (ref 22–32)
Calcium: 10 mg/dL (ref 8.9–10.3)
Chloride: 106 mmol/L (ref 98–111)
Creatinine: 1.11 mg/dL — ABNORMAL HIGH (ref 0.44–1.00)
GFR, Estimated: 51 mL/min — ABNORMAL LOW
Glucose, Bld: 178 mg/dL — ABNORMAL HIGH (ref 70–99)
Potassium: 4.1 mmol/L (ref 3.5–5.1)
Sodium: 140 mmol/L (ref 135–145)
Total Bilirubin: 0.6 mg/dL (ref 0.0–1.2)
Total Protein: 6.6 g/dL (ref 6.5–8.1)

## 2024-03-21 MED ORDER — OXYCODONE HCL 5 MG PO TABS: 5.0000 mg | ORAL_TABLET | ORAL | 0 refills | Status: DC | PRN

## 2024-03-21 MED ORDER — LORAZEPAM 0.5 MG PO TABS: 0.5000 mg | ORAL_TABLET | Freq: Three times a day (TID) | ORAL | 0 refills | Status: AC

## 2024-03-21 NOTE — Progress Notes (Signed)
 Rapid Diagnostic Services  Patient presented to clinic, with daughter and husband, for her scheduled appointment with NP Melissa. I introduced myself and provided them with my direct contact information. Patient and family were both encouraged to call me with any questions/concerns they may have.  Selinda An, RN Oncology Nurse Navigator, Rapid Diagnostic Services  03/21/2024 10:41 AM

## 2024-03-21 NOTE — Progress Notes (Signed)
 " Rapid Diagnostic Clinic [facility] [phone]   [phone]  INITIAL CONSULTATION:  Patient Care Team: Keren [name], MD as PCP - General (Internal Medicine) Caro Selinda LABOR, RN as Oncology Nurse Navigator  CHIEF COMPLAINTS/PURPOSE OF CONSULTATION:  Dysphagia   HISTORY OF PRESENTING ILLNESS:  Julia Logan 79 y.o. female with medical history significant for dysphagia with solids and liquids, choking, cough, throat pain and heartburn. This has been gradual onset of about 6 months. These symptoms have worsened and she now has complaints of hoarseness. She has ben evaluated by ENT and GI. She was started Protonix  without much improvement. CT imaging was obtained by her PCP and she was found to have locally aggressive base of tongue neoplasm with extension into both lingual tonsils as the left palatine tonsil; bilateral enlarged and likely malignant cervical adenopathy; left supraclavicular/ base of neck adenopathy; concerning upper mediastinal/ paraesophageal lymph node.   On review of the previous records, she has a history of anemia that appears to be related to chronic renal insufficiency.  Of note, she has had a partial left nephrectomy in 2017 for kidney cancer. Despite this diagnosis, the patient has only had one dose of Retacrit  for her anemia, which was back in November 2023.     On exam today she denies fever, chills, nausea or vomiting. She denies shortness of breath or chest pain. She complains of pain to her throat and neck. She is unable to swallow solids and has trouble with most liquids. This has resulted in weight loss of approximately 20 pounds. She has noted weakness of voice. She denies issue with bowel or bladder.   MEDICAL HISTORY:  Past Medical History:  Diagnosis Date   Abnormal nuclear stress test 09/09/2017   Acid reflux disease 05/05/2017   Anemia of chronic kidney failure, unspecified stage 01/16/2022   Atrophic vaginitis  08/14/2016   Cancer of kidney (HCC) 10/04/2015   Cervical spondylosis without myelopathy    Chronic pain 05/05/2017   Overview:   back and right side     CKD (chronic kidney disease) stage 3, GFR 30-59 ml/min (HCC) 10/27/2017   Colon polyps    Coronary artery disease involving native coronary artery of native heart with angina pectoris 01/01/2017   Diabetes mellitus (HCC) 12/31/2016   Diverticulitis of colon (without mention of hemorrhage)(562.11)    ESA (erythropoietin stimulating agent) anemia management patient 05/05/2017   Esophageal reflux    Essential hypertension, benign    Hard of hearing 05/05/2017   Heart disease    History of heart artery stent 04/02/2023   History of kidney cancer    History of nephrectomy 03/19/2016   History of recurrent UTI (urinary tract infection) 05/05/2017   Hyperlipidemia 05/05/2017   Hypertension 12/31/2016   Iron deficiency anemia due to chronic blood loss 12/19/2021   Iron deficiency anemia, unspecified    Malignant neoplasm of left kidney (HCC) 10/09/2017   Osteoarthrosis, unspecified whether generalized or localized, shoulder region    Osteoporosis    Other and unspecified hyperlipidemia    Restless legs syndrome (RLS)    Sciatica, right side 05/05/2017   Stable angina pectoris 05/11/2017   Type II or unspecified type diabetes mellitus without mention of complication, not stated as uncontrolled    Urinary tract infection 09/24/2015    SURGICAL HISTORY: Past Surgical History:  Procedure Laterality Date   APPENDECTOMY  03/18/1975   BUNIONECTOMY     CORONARY BALLOON ANGIOPLASTY N/A 11/09/2017   Procedure: CORONARY  BALLOON ANGIOPLASTY;  Surgeon: Jordan, Peter M, MD;  Location: [facility] INVASIVE CV LAB;  Service: Cardiovascular;  Laterality: N/A;   CORONARY STENT INTERVENTION N/A 01/01/2017   Procedure: CORONARY STENT INTERVENTION;  Surgeon: Jordan, Peter M, MD;  Location: [facility] INVASIVE CV LAB;  Service: Cardiovascular;  Laterality: N/A;   KNEE  SURGERY     LEFT HEART CATH AND CORONARY ANGIOGRAPHY N/A 01/01/2017   Procedure: LEFT HEART CATH AND CORONARY ANGIOGRAPHY;  Surgeon: Jordan, Peter M, MD;  Location: [facility] INVASIVE CV LAB;  Service: Cardiovascular;  Laterality: N/A;   LEFT HEART CATH AND CORONARY ANGIOGRAPHY N/A 11/09/2017   Procedure: LEFT HEART CATH AND CORONARY ANGIOGRAPHY;  Surgeon: Jordan, Peter M, MD;  Location: [facility] INVASIVE CV LAB;  Service: Cardiovascular;  Laterality: N/A;   PARTIAL NEPHRECTOMY Left 2017   VAGINAL HYSTERECTOMY  03/18/1975    SOCIAL HISTORY: Social History   Socioeconomic History   Marital status: Married    Spouse name: Warden/ranger   Number of children: 2   Years of education: 17   Highest education level: Master's degree (e.g., MA, MS, MEng, MEd, MSW, MBA)  Occupational History   Occupation: retired runner, broadcasting/film/video  Tobacco Use   Smoking status: Never   Smokeless tobacco: Never  Vaping Use   Vaping status: Never Used  Substance and Sexual Activity   Alcohol use: No   Drug use: No   Sexual activity: Not on file  Other Topics Concern   Not on file  Social History Narrative   Lives at home with husband.   Right-handed.   No daily caffeine use.   Social Drivers of Health   Tobacco Use: Low Risk (03/21/2024)   Patient History    Smoking Tobacco Use: Never    Smokeless Tobacco Use: Never    Passive Exposure: Not on file  Financial Resource Strain: Not on file  Food Insecurity: Low Risk (09/17/2023)   Received from [facility]   Epic    Within the past 12 months, you worried that your food would run out before you got money to buy more: Never true    Within the past 12 months, the food you bought just didn't last and you didn't have money to get more. : Never true  Transportation Needs: No Transportation Needs (09/17/2023)   Received from [facility]    In the past 12 months, has lack of reliable transportation kept you from medical appointments, meetings, work or from getting  things needed for daily living? : No  Physical Activity: Not on file  Stress: Not on file  Social Connections: Unknown (10/08/2021)   Received from [facility]   Social Network    Social Network: Not on file  Intimate Partner Violence: Not At Risk (12/20/2022)   Received from [facility]    Abuse Screen    Feels Unsafe at Home or Work/School: no    Feels Threatened by Someone: no    Does Anyone Try to Keep You From Having Contact with Others or Doing Things Outside Your Home?: no    Physical Signs of Abuse Present: no  Depression (PHQ2-9): Low Risk (03/21/2024)   Depression (PHQ2-9)    PHQ-2 Score: 0  Alcohol Screen: Not on file  Housing: Low Risk (09/17/2023)   Received from [facility]   Epic    What is your living situation today?: I have a steady place to live    Think about the place you live. Do you have problems  with any of the following? Choose all that apply:: None/None on this list  Utilities: Low Risk (09/17/2023)   Received from [facility]   Utilities    In the past 12 months has the electric, gas, oil, or water company threatened to shut off services in your home? : No  Health Literacy: Not on file    FAMILY HISTORY: Family History  Problem Relation Age of Onset   Diabetes Maternal Grandfather    Heart attack Father    Diabetes Sister    Hypertension Sister    Diabetes Brother    Hypertension Brother     ALLERGIES:  is allergic to gabapentin, ticagrelor, and codeine.  MEDICATIONS:  Current Outpatient Medications  Medication Sig Dispense Refill   Acetaminophen  (TYLENOL  EXTRA STRENGTH PO) Take by mouth.     aspirin  81 MG tablet Take 81 mg by mouth daily.     atorvastatin  (LIPITOR) 80 MG tablet Take 1 tablet (80 mg total) by mouth daily. 30 tablet 0   Calcium  Carb-Cholecalciferol  (CALCIUM /VITAMIN D  PO) Take 1 tablet by mouth daily. 1200-1000 mg-unit     estradiol  (ESTRACE ) 0.1 MG/GM vaginal cream Place 1 Applicatorful vaginally 3  (three) times a week.     ezetimibe (ZETIA) 10 MG tablet Take 10 mg by mouth daily.   3   Ferrous Sulfate  (IRON) 325 (65 Fe) MG TABS Take 325 mg by mouth every other day.     glimepiride (AMARYL) 4 MG tablet Take 4 mg by mouth daily with breakfast.   3   metoprolol  tartrate (LOPRESSOR ) 25 MG tablet TAKE 1/2 TABLET(12.5 MG) BY MOUTH TWICE DAILY 90 tablet 0   olmesartan (BENICAR) 20 MG tablet Take 20 mg by mouth daily.     Probiotic Product (PROBIOTIC PO) Take 1 capsule by mouth daily.     ranolazine  (RANEXA ) 500 MG 12 hr tablet Take 1 tablet (500 mg total) by mouth 2 (two) times daily. KEEP OV. 180 tablet 0   LORazepam  (ATIVAN ) 0.5 MG tablet Take 1 tablet (0.5 mg total) by mouth every 8 (eight) hours. 30 tablet 0   oxyCODONE  (OXY IR/ROXICODONE ) 5 MG immediate release tablet Take 1 tablet (5 mg total) by mouth every 4 (four) hours as needed for severe pain (pain score 7-10). 30 tablet 0   No current facility-administered medications for this visit.    REVIEW OF SYSTEMS:   Constitutional: ( - ) fevers, ( - )  chills , ( - ) night sweats Eyes: ( - ) blurriness of vision, ( - ) double vision, ( - ) watery eyes Ears, nose, mouth, throat, and face: ( - ) mucositis, ( + ) sore throat Respiratory: ( + ) cough, ( - ) dyspnea, ( - ) wheezes Cardiovascular: ( - ) palpitation, ( - ) chest discomfort, ( - ) lower extremity swelling Gastrointestinal:  ( - ) nausea, ( - ) heartburn, ( - ) change in bowel habits Skin: ( - ) abnormal skin rashes Lymphatics: ( + ) new lymphadenopathy, ( - ) easy bruising Neurological: ( - ) numbness, ( - ) tingling, ( - ) new weaknesses Behavioral/Psych: ( - ) mood change, ( - ) new changes  All other systems were reviewed with the patient and are negative.  PHYSICAL EXAMINATION: ECOG PERFORMANCE STATUS: 1 - Symptomatic but completely ambulatory  Vitals:   03/21/24 1003  BP: (!) 169/64  Pulse: 61  Resp: 16  Temp: 98.6 F (37 C)  SpO2: 100%   Filed Weights  03/21/24 1003  Weight: 147 lb 11.2 oz (67 kg)    GENERAL: well appearing  in NAD  SKIN: skin color, texture, turgor are normal, no rashes or significant lesions EYES: conjunctiva are pink and non-injected, sclera clear OROPHARYNX: no exudate, no erythema; lips, buccal mucosa, with redness and noted area to the left back of throat. NECK: supple, non-tender, cervical lymphadenopathy noted LYMPH: palpable lymphadenopathy in the cervical, no palpable axillary or supraclavicular lymph nodes.  LUNGS: clear to auscultation and percussion with normal breathing effort HEART: regular rate & rhythm and no murmurs and no lower extremity edema ABDOMEN: soft, non-tender, non-distended, normal bowel sounds Musculoskeletal: no cyanosis of digits and no clubbing  PSYCH: alert & oriented x 3, fluent speech NEURO: no focal motor/sensory deficits  LABORATORY DATA:  I have reviewed the data as listed    Latest Ref Rng & Units 03/21/2024   10:35 AM 11/05/2023    1:52 PM 05/04/2023    2:02 PM  CBC  WBC 4.0 - 10.5 K/uL 3.8  4.6  4.5   Hemoglobin 12.0 - 15.0 g/dL 89.4  89.0  88.0   Hematocrit 36.0 - 46.0 % 31.7  33.7  35.4   Platelets 150 - 400 K/uL 183  176  202        Latest Ref Rng & Units 11/05/2023    1:52 PM 05/12/2023   11:13 AM 05/04/2023    2:02 PM  CMP  Glucose 70 - 99 mg/dL 762  744  823   BUN 8 - 23 mg/dL 34  28  28   Creatinine 0.44 - 1.00 mg/dL 8.36  8.61  8.33   Sodium 135 - 145 mmol/L 137  138  141   Potassium 3.5 - 5.1 mmol/L 4.2  4.6  4.6   Chloride 98 - 111 mmol/L 102  103  104   CO2 22 - 32 mmol/L 22  25  27    Calcium  8.9 - 10.3 mg/dL 89.7  89.4  89.2   Total Protein 6.5 - 8.1 g/dL 6.5  6.5  6.8   Total Bilirubin 0.0 - 1.2 mg/dL 0.8  0.6  0.7   Alkaline Phos 38 - 126 U/L 98  108  116   AST 15 - 41 U/L 33  25  29   ALT 0 - 44 U/L 25  17  19       RADIOGRAPHIC STUDIES: I have personally reviewed the radiological images as listed and agreed with the findings in the report. No  results found.  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 She presents today for further evaluation and plan of care. We discussed concern for malignancy and need for further evaluation including imaging and biopsy. We will plan for PET scan to assess spread of disease, referral to ENT for urgent biopsy, referral to radiation and medical oncology pending pathology results. We will also arrange for mediport placement should patient's treatment plan include chemotherapy. I will send in oxycodone  for pain and ativan  for anxiety. Her husband and daughter were present for this exam and questions were answered.   Orders Placed This Encounter  Procedures   NM PET Image Initial (PI) Skull Base To Thigh    Standing Status:   Future    Expected Date:   03/22/2024    Expiration Date:   03/21/2025    If indicated for the ordered procedure, I authorize  the administration of a radiopharmaceutical per Radiology protocol:   Yes    Preferred imaging location?:   [facility]   CBC with Differential (Cancer Center Only)    Standing Status:   Future    Number of Occurrences:   1    Expiration Date:   03/21/2025   CMP (Cancer Center only)    Standing Status:   Future    Number of Occurrences:   1    Expiration Date:   03/21/2025    All questions were answered. The patient knows to call the clinic with any problems, questions or concerns.  I have spent a total of 45 minutes minutes of face-to-face and non-face-to-face time, preparing to see the patient, obtaining and/or reviewing separately obtained history, performing a medically appropriate examination, counseling and educating the patient, ordering medications/tests/procedures, referring and communicating with other health care professionals, documenting clinical information in the electronic health record, independently interpreting results and communicating results to the  patient, and care coordination.   Eleanor Bach, FNP- BC Department of Hematology/Oncology [facility] at [facility] Phone: [phone] "

## 2024-03-22 ENCOUNTER — Telehealth: Payer: Self-pay | Admitting: Dietician

## 2024-03-22 ENCOUNTER — Other Ambulatory Visit: Payer: Self-pay | Admitting: Hematology and Oncology

## 2024-03-22 ENCOUNTER — Telehealth: Payer: Self-pay

## 2024-03-22 DIAGNOSIS — J358 Other chronic diseases of tonsils and adenoids: Secondary | ICD-10-CM

## 2024-03-22 DIAGNOSIS — N183 Chronic kidney disease, stage 3 unspecified: Secondary | ICD-10-CM

## 2024-03-22 NOTE — Telephone Encounter (Signed)
 Patients daughter had Called Selinda An RN this morning and spoke to Eleanor Bach NP and was told to go to the ED for c/o difficlty swallowing and shortness of breath. Melissa Parsons is following this and they are currently in the ED. I also spoke with patients daughter this morning and they had just finished talking to Melissa at that time and voiced understanding.

## 2024-03-22 NOTE — Telephone Encounter (Signed)
 Daughter Dan returned voicemail.  She relayed that she and patient are currently in the ER and that patient can't swallow even her saliva right now.  Encouraged daughter to return call after evaluation at ER. Let her know nutrition services are provided at no change in [facility] and my availability and on site schedule in [place].  Micheline Craven, RDN, LDN Registered Dietitian, [facility] Part Time Remote (Usual office hours: Tuesday-Thursday) Mobile: [phone] Remote Office: [phone]

## 2024-03-22 NOTE — Telephone Encounter (Signed)
 Patient screened on MST. First attempt to reach. Provided my cell# on voice mail to return call to set up a nutrition consult.  Micheline Craven, RDN, LDN Registered Dietitian, [facility] Part Time Remote (Usual office hours: Tuesday-Thursday) Cell: [phone]

## 2024-03-23 ENCOUNTER — Encounter: Payer: Self-pay | Admitting: Oncology

## 2024-03-24 ENCOUNTER — Encounter: Payer: Self-pay | Admitting: Oncology

## 2024-03-27 NOTE — Discharge Summary (Signed)
 "  Leukemia Discharge Summary  Name: Julia Logan MRN: [id number] Age: 79 yrs DOB: 05/10/1945  Admit date: 03/23/2024   Discharge date and time: 03/29/2024  Admitting Physician: Madelyn Elizabeth Szewczyk Burkart, MD Discharge Physician: : Madelyn Elizabeth Szewcz*  Admission Diagnoses:  Mass of tongue [K14.8] Dysphagia [R13.10] Dysphagia, unspecified type [R13.10]   Discharge Diagnoses:  Mass of tongue Dysphagia Failure to thrive  Admission Condition: fair Discharged Condition: fair  Hospital Course:  For full details, please see H&P, progress notes, consult notes and ancillary notes.   Briefly, Julia Logan is a 79 y.o. female with PMHx of RCC s/p partial nephrectomy in 2017, CAD s/p PCI on asa monotherapy, chronic stable angina, NIDDM2, HTN, HLD, CKD presenting with progressive dysphagia and dyspnea and found on outside imaging to have concerns for progressive head and neck cancer.   Tongue mass [name] presented to [facility] on 03/23/2024  due to concerns for multiple months of progressive dysphagia/odynophagia now having difficulty swallowing liquids with associated shortness of breath.  CT from outside hospital demonstrates extensive matted lymphadenopathy of anterior cervical chain and submandibular lymph nodes bilaterally.  Patient was evaluated by ENT and general surgery.  She received a joint DL biopsy and G-tube placement on 03/24/2024 and tolerated well without immediate complications.  On 03/25/2024, G-tube fell out due to deflated balloon.  EGS was urgently paged, NG tube was quickly replaced without complications. Radiation oncology was consulted who recommended outpatient radiation follow-up.  Throughout admission airway remained patent.  Pain was first managed with IV Dilaudid, upon G-tube placement she was transitioned to scheduled Tylenol  1 g every 8 hours, Orajel every 4 hours as needed for gum pain, with p.o. Dilaudid for breakthrough  pain. She will have follow-up with radiation oncology as well as medical oncology (Dr. Lycan).  Radiation oncology plans for outpatient radiation treatment that will be performed in coordination with medical oncologist. For further management of tongue mass.  Outpatient oncologist will need to follow-up tongue mass biopsy.  Dysphagia Failure to thrive Regarding patient's dysphagia, SLP was consulted following evaluation with FEES, patient was cleared for dysphagia diet of pured foods with thin liquids.  While inpatient, patient patient tolerated dysphagia diet with thin liquids well. Dietary was consulted for G-tube feeding management.  Upon G-tube placement, patient received bolus tube feeds.  Electrolytes were repleted due to refeeding syndrome. Patient was discharged with bolus tube feeds of Isosource 1.5 boluses (250 mL each) 4x daily with flushes 90mL pre and post bolus. This regimen will provide 1500 kcal, 68g protein, and 1484 mL free water. She will also be discharged with dysphagia precautions.   Hypertension Patient remained hypertensive throughout admission systolic blood pressures ranged from SBP150-180.  In patient losartan was increased from 50mg  to 100mg . On discharge, blood pressure medication was reconciled to her home olmesartan with a dose increase to 40mg  daily (from home 20mg  daily).  CAD Patient was monitored on telemetry throughout admission due to history of PCI. Home medications were briefly held until enteral access was obtained, at that time atorvastatin  and zetia were restart. Ranexa  was held for the duration of admission and was held on discharge as it is unable to be crush.   Tests Pending at the time of discharge: Pending Labs     Order Current Status   Tissue Exam In process       Discharge Follow-up Action Items: 1.  Follow-up with PCP in 1 to 2 weeks  - CBC and  CMP  - Ensure adequate pain control  - Health maintenance  - Hypertension and CAD management 2.   Follow-up with medical oncology  - Follow-up biopsy results 3. Follow-up with radiation oncology  - Radiation oncology planning 3.  New medications  - Dilaudid p.o. 1mg  q4h for pain  - Scheduled Tylenol  1000g q8 hours for 5 days, then can use PRN after  - Thiamine 100mg  daily for 3 days (total 7d treatment)  - Melatonin 6mg  nightly as needed for sleep  - Holding Ranexa  as it cannot be crushed to go through gtube 4. Tube feeding recommendations - Isosource 1.5 boluses (250 mL each) 4x daily with flushes 90mL pre and post bolus. This regimen will provide 1500 kcal, 68g protein, and 1484 mL freewater.  - Dysphagia diet: pureed foods with thin liquids    Patient's Ordered Code Status: Full Code  Consults: IP CONSULT TO ENT IP CONSULT TO GENERAL SURGERY IP CONSULT TO ENT IP CONSULT TO RADIATION ONCOLOGY IP CONSULT TO NUTRITION SERVICES IP CONSULT FOR PSYCHOSOCIAL ONCOLOGY & CANCER PATIENT SUPPORT  CBC:  Results from last 7 days  Lab Units 03/29/24 0315 03/28/24 1054 03/27/24 0710 03/26/24 0326  WHITE BLOOD CELL COUNT 10*3/uL 6.80 4.30* 3.80* 5.40  HEMOGLOBIN g/dL 88.1* 88.8* 89.4* 89.0*  HEMATOCRIT % 35.8* 33.0* 31.7* 33.1*  PLATELET COUNT 10*3/uL 196 178 161 200   CBD:  Results from last 7 days  Lab Units 03/29/24 0315 03/28/24 1054 03/27/24 0710 03/24/24 0253 03/23/24 0853  WHITE BLOOD CELL COUNT 10*3/uL 6.80 4.30* 3.80*   < > 3.50*  RED BLOOD CELL COUNT 10*6/uL 4.22 3.91* 3.76*   < > 4.10  HEMOGLOBIN g/dL 88.1* 88.8* 89.4*   < > 11.5*  HEMATOCRIT % 35.8* 33.0* 31.7*   < > 33.9*  MEAN CORPUSCULAR VOLUME fL 84.8 84.2 84.3   < > 82.5  MEAN CORPUSCULAR HEMOGLOBIN pg 28.1 28.2 27.9   < > 28.1  MEAN CORPUSCULAR HEMOGLOBIN CONC g/dL 66.8 66.4 66.8   < > 65.9  RED CELL DISTRIBUTION WIDTH % 15.4 15.5 15.3   < > 15.1  MEAN PLATELET VOLUME fL 7.7 7.4 7.3   < > 7.6  PLATELET COUNT 10*3/uL 196 178 161   < > 193  LYMPHOCYTES RELATIVE PERCENT % 16  --   --   --  17   NEUTROPHILS RELATIVE PERCENT % 73  --   --   --  81  MONOCYTES RELATIVE PERCENT % 9  --   --   --  1  EOSINOPHILS RELATIVE PERCENT % 2  --   --   --  0  BASOPHILS RELATIVE PERCENT % 0  --   --   --  0  NRBC ABS 10*3/uL 0.00  --   --   --  0.00  NEUTROPHILS ABSOLUTE COUNT 10*3/uL 4.90  --   --   --  2.80  LYMPHOCYTES ABSOLUTE COUNT 10*3/uL 1.10  --   --   --  0.60*  MONOCYTES ABSOLUTE COUNT 10*3/uL 0.60  --   --   --  0.00  EOSINOPHILS ABSOLUTE COUNT 10*3/uL 0.20  --   --   --  0.00  BASOPHILS ABSOLUTE COUNT 10*3/uL 0.00  --   --   --  0.00   < > = values in this interval not displayed.   CMP:  Results from last 7 days  Lab Units 03/29/24 0315 03/28/24 1054 03/27/24 0710 03/26/24 0326 03/25/24 0327 03/24/24 0252 03/23/24 0853  SODIUM  mmol/L 137 136 139 135*   < > 138 138  POTASSIUM mmol/L 4.3 4.1 3.9 4.3   < > 4.3 4.0  CHLORIDE mmol/L 102 103 101 101   < > 104 102  CO2 mmol/L 28 29 30 27    < > 27 23  BUN mg/dL 18 15 14 20    < > 27* 19  CREATININE mg/dL 9.01 9.13 9.10 8.96   < > 1.02 1.17  CALCIUM  mg/dL 9.5 8.9 8.6 9.2   < > 9.5 9.5  MAGNESIUM mg/dL 2.1 1.7* 1.6* 1.9   < > 2.2 1.5*  PHOSPHORUS mg/dL 2.9 2.5 2.9 1.6*   < > 2.7  --   BILIRUBIN TOTAL mg/dL 0.8 1.1*  --   --   --  0.6 0.7  AST U/L 22 20  --   --   --  30 32  ALT U/L 11 11  --   --   --  16 19  TOTAL PROTEIN g/dL 5.7* 5.6*  --   --   --  5.5* 6.0*  ALBUMIN g/dL 3.5 3.4*  --   --   --  3.5 3.7  ANION GAP mmol/L 7 4* 8 7   < > 7 13   < > = values in this interval not displayed.   Coags:   Results from last 7 days  Lab Units 03/23/24 0853  INR  0.9  APTT seconds 24.0   BNP:  Results from last 7 days  Lab Units 03/23/24 0853  BNP pg/mL 325*   Troponin:     Disposition: Home or Self Care   Patient Instructions:    Discharge Medications     PAUSE taking these medications      Sig Disp Refill Start End  ferrous sulfate  325 mg (65 mg iron) tablet Wait to take this until your doctor or other care  provider tells you to start again.  Take 650 mg by mouth every other day.   0     ranolazine  500 mg 12 hr tablet Wait to take this until your doctor or other care provider tells you to start again. Commonly known as: RANEXA   Take 500 mg by mouth 2 (two) times a day. keep ov.   0         New Medications      Sig Disp Refill Start End  acetaminophen  500 mg tablet Commonly known as: TYLENOL   Take 2 tablets (1,000 mg total) every 6 hours by G-tube for 5 days. After 5 days can then take 2 tablets (1,000 mg total) every 8 hours as needed for pain.  90 tablet  0     HYDROmorphone 2 mg tablet Commonly known as: DILAUDID  Take one-half tablet (1 mg total) by G-tube route every 4 (four) hours as needed for moderate pain (4-6) or severe pain (7-10). (0.5 tablets (1 mg total) by G-tube route every 4 (four) hours as needed for moderate pain (4-6) or severe pain (7-10).)  15 tablet  0     losartan 100 mg tablet Commonly known as: COZAAR Replaces: olmesartan 20 mg tablet  Take1 tablet (100 mg total) by G-tube route daily. (1 tablet (100 mg total) by G-tube route daily.)  90 tablet  3  March 30, 2024    melatonin 3 mg tablet  Take 2 tablets (6 mg total) by G-tube route nightly as needed for sleep. (2 tablets (6 mg total) by G-tube route nightly as needed for sleep.)  60  tablet  0     polyethylene glycol 17 gram Powd powder Commonly known as: MIRALAX  Mix 1 measured capful (17g) in 4-8 ounces of water or juice. Stir until dissolved and drink by G-tube daily as directed. (17 g by G-tube route daily.)  510 g  0     thiamine 100 mg tablet Commonly known as: VITAMIN B1  Take 1 tablet (100 mg total) by G-tube route daily for 3 days. (1 tablet (100 mg total) by G-tube route daily for 3 days.)  3 tablet  0  March 30, 2024        Modified Medications      Sig Disp Refill Start End  aspirin  81 mg chewable tablet What changed: how to take this  1 tablet (81 mg total) by  G-tube route daily.   0     atorvastatin  80 mg tablet Commonly known as: LIPITOR What changed: how to take this  1 tablet (80 mg total) by G-tube route daily.   0     calcium  carbonate-vitamin D3 600 mg-5 mcg (200 unit) Tab What changed: how to take this  1 tablet by G-tube route 2 (two) times a day.   0     ezetimibe 10 mg tablet Commonly known as: ZETIA What changed: how to take this  1 tablet (10 mg total) by G-tube route daily.   0     glimepiride 4 mg tablet Commonly known as: AMARYL What changed:  how to take this when to take this  1 tablet (4 mg total) by G-tube route daily with breakfast.   0     LORazepam  0.5 mg tablet Commonly known as: ATIVAN  What changed:  reasons to take this additional instructions  Take 1 tablet (0.5 mg total) by mouth every 8 (eight) hours as needed for anxiety. Can administer sublingual if needed   0     metoprolol  tartrate 25 mg tablet Commonly known as: LOPRESSOR  What changed: how to take this  0.5 tablets (12.5 mg total) by G-tube route 2 (two) times a day.   0     nitroglycerin  0.4 mg SL tablet Commonly known as: NITROSTAT  What changed:  when to take this reasons to take this  Place 1 tablet (0.4 mg total) under the tongue every 5 (five) minutes as needed for chest pain.   0         Medications To Continue      Sig Disp Refill Start End  Accu-Chek Guide test strips test strip Generic drug: glucose blood  1 strip daily. to test blood glucose   0     Accu-Chek Softclix Lancets Misc Generic drug: Lancets  daily. use as directed   0     albuterol HFA 90 mcg/actuation inhaler Commonly known as: PROVENTIL HFA;VENTOLIN HFA;PROAIR HFA  Inhale 2 puffs 4 (four) times a day.   0     estradioL  0.01 % (0.1 mg/gram) vaginal cream Commonly known as: ESTRACE   Insert 2 g into the vagina every Monday, Wednesday and Friday for 30 days.  42.5 g  11     pantoprazole  40 mg EC tablet Commonly known as: PROTONIX   Take 40 mg  by mouth Once Daily.   0         Stopped Medications    olmesartan 20 mg tablet Commonly known as: BENICAR Replaced by: losartan 100 mg tablet   oxyCODONE  5 mg immediate release tablet Commonly known as: ROXICODONE   Discharge Orders     Home Health Skilled Nursing     Details:    Home Health Skilled Nursing Service:  Education Medication     Medication Education: Observation and Assessment with Medication Education and Mgmt   Education Needed: Disease Process Education       Follow-up  Future Appointments  Date Time Provider Department Center  04/07/2024  1:30 PM [facility] UROLOGY [facility]  [facility] [place]  04/07/2024  2:15 PM Tanda Jama Nicholaus DOUGLAS, MD [facility] URO [facility]PE [facility] [place]     Electronically signed by: Marolyn Rumalda Dike, MD 03/29/2024   I have reviewed the history, examined the patient, and reviewed labs.  I have discussed the assessment and plan with the patient and with the team.  I agree with the resident's findings and plan of care as outlined above which I have reviewed and edited where appropriate.    Madelyn Burkart, MD Heme/Onc [phone]  "

## 2024-03-28 NOTE — Consults (Signed)
 Nutrition Note   PATIENT NAME: Julia Logan DATE: 03/28/2024  TIME: 1:34 PM  Note Type: Follow-up Referral Reason: Nutrition consult  Assessment  79 y.o. female with PMHx of RCC s/p partial nephrectomy in 2017, CAD s/p PCI on asa monotherapy, chronic stable angina, NIDDM2, HTN, HLD, CKD presenting with progressive dysphagia and dyspnea and found on outside imaging to have concerns for progressive head and neck cancer.   Diet advanced to puree per SLP 1/10. Spoke with MD Montey regarding electrolytes declining; discussed ideally stable before discharge. Pt family reports she was not given menu and was not sure how to order. RD provided menu and reviewed how to order.    Interventions  Nutrition Intervention: Enteral nutrition, Vitamin and/or mineral supplements Discharge recommendations: Isosource 1.5 boluses (250 mL each) 4x daily with flushes 90mL pre and post bolus. This regimen will provide 1500 kcal, 68g protein, and 1484 mL freewater.  Change TF regimen to 2 full carton boluses rather than 4 half carton boluses today to assess volume tolerance. Holding off on advancing to goal volume until lytes stabilize. Plan to advance discussed with patient; awaiting message from RN regarding how full volume bolus was tolerated once given.  Continue current diet order per SLP recs Add Glucerna once daily Continue thiamine supplementation for total of 7 days Education Documentation Tube Feeding, taught by Marjorie Moats, RD at 03/28/2024  1:34 PM. Learner: Family, Patient Readiness: Acceptance Method: Explanation, Handout Response: Verbalizes Understanding Comment: new bolus feeds  Education Comments No comments found.     Nutrition Diagnosis  Severe protein-calorie malnutrition POA  Malnutrition Etiology: Chronic illness      Weight Loss: >10% weight loss - 6 months Energy Intake: < or equal to 75% of est energy needs for > or equal to 1 month Muscle Mass Depletion Summary: severe  muscle wasting Body Fat Depletion Summary: moderate fat wasting         Nutrition Focused Physical Findings  NFPE Performed Date: 03/25/24 Signs of Muscle Wasting, Fat Loss or Micronutrient Deficiencies: Yes  Muscle Mass Depletion: Temporalis: Moderate Pectoralis: Severe Deltoids: Moderate Scapula Region: Mild Interosseous: Severe Gastrocnemius: Mild   Body Fat Depletion: Orbital Region: Moderate Triceps Region: Moderate            Pertinent Information  Anthropometrics Height: 152.4 cm (5') Weight: 64 kg (141 lb 3.2 oz) Weight History/Comments: wt stable Weight Classification: Normal, For adult > 50 years old BMI: 27.58 Skin Integrity: WNL Nutrition Related Medications: humalog (not given), glycolax, thiamine Labs: [id number] K 4.8>4.3>3.9>4.1, Mg 1.7>1.9>1.6>1.7, Phos 3.7>1.6(supplement given)>2.9>2.5 GI Symptoms: Other (Comment) (Pt reported blood in BM) Last BM Date: 03/27/24    Current Diet and Nutrition Support Details  Diet Puree    Enteral Nutrition Isosource 1.5 125mL bolus 4x daily   EN Flushes: 90mL pre/post bolus G-Tube 03/25/24  EN Provides: 750 kcal, 34g protein, and 1102 mL freewater.     Estimated Needs  Nutrition Needs Based On: IBW  (46kg)  Calories (kcal/day): 1380-1610 kcal/d  Calories based on: 30-35 kcal/kg  Protein (g/day): 55-69g/d  Protein based on: 1.2-1.5g/kg, IBW  Fluid (mL/day):   1-1.1 mL/kcal      Monitoring & Evaluation  Nutrition Goals: Tolerance to nutrition support, Minimize nutrition impact symptoms, Adequate PO (monitor lytes) Nutrition Goal Status: Progressing, continue   Follow-up Information  Follow-Up Date: 04/02/24 Follow-Up Reason: Reassessment   Contact RD on treatment team. After hours or weekends, use secure chat group: [facility]  [facility] Adult Dietitians [place]  [facility]  Dietitians [place]  [facility] [place]  [facility]it[facility] [place]  [facility] Dietitians   Marjorie Moats, [place] 03/28/2024 1:34 PM

## 2024-03-29 NOTE — Telephone Encounter (Signed)
 Dx. Concern for progressive head and neck cancer Dr. Lycan Peri draw Discharge today 1/13  Per Dr. Lycan  Please schedule for next week:   Labs: cdp,cmp. New patient visit with Dr. Lycan.

## 2024-03-29 NOTE — Progress Notes (Signed)
 Case Management Discharge Note        CSN: [id number] DOB: [birth year] Service: Oncology Location: C637/A  Patient Class: Inpatient  DC Disposition: : Home Health Care  Discharge DC Disposition: : Home Health Care Homecare Referral: (SN) Skilled Nursing Homecare/Hospice Agency(s) chosen: [facility] Referral : Enteral Infusion Referral Agency(s) Chosen: [facility]  Discharge Referrals Patient Preference: Chosen geographical local area/county shared with patient/family: Return/previous involvement Patient Preference for Post-Acute Provider Form completed: Return/Previous Involvement Case closed, patient/family agree with disposition plan: Yes       Case Management Coordination Status: Coordination Complete     Julia KATHEE Hong, RN

## 2024-03-31 ENCOUNTER — Inpatient Hospital Stay: Admitting: Dietician

## 2024-03-31 ENCOUNTER — Institutional Professional Consult (permissible substitution) (INDEPENDENT_AMBULATORY_CARE_PROVIDER_SITE_OTHER)

## 2024-03-31 MED ORDER — ISOSOURCE 1.5 CAL PO LIQD: ORAL | 12 refills | Status: DC

## 2024-03-31 NOTE — Progress Notes (Signed)
 Nutrition Follow-up:  Patient is a 79 y.o. female with PMHx of RCC s/p partial nephrectomy in 2017, CAD s/p PCI on asa monotherapy, chronic stable angina, NIDDM2, HTN, HLD, CKD recently hospitalized with progressive dysphagia and dyspnea.  She was seen at  [facility]. CT imaging was obtained by her PCP and she was found to have locally aggressive base of tongue neoplasm with extension into both lingual tonsils as the left palatine tonsil; bilateral enlarged and likely malignant cervical adenopathy; left supraclavicular/ base of neck adenopathy; concerning upper mediastinal/ paraesophageal lymph node.  During hospitalization she was evaluated by SLT and PO safety recommendations were for pureed foods but daughter relayed she is barely taking a few bites. Currently 90% of nourishment is via PEG.  She is planning on treatment and meets with Drs. Lewis and Palermo next week to discuss concurrent chemo/radiation.  Patient was being monitored for refeeding syndrome and is currently not at goal TF regimen. Plans are to advance to 4 carton in 2 days. Currently tolerating 3 cartons Isosource 1.5.   She was only sent one carton of supplies from [place] after leaving hospital.  Medications: Miralax, dilaudid and tylenol  for pain, not taking the oxycodone  currently  Labs: 03/30/23 Mg 2.1, Phos 2.9, K+ 4.3, 03/21/24 at [facility]  GFR 51, Hgb 10.5  Anthropometrics: daughter reports 20# weight loss since 11/2023  Height: 61 (64 at [facility]) Weight: 03/29/23 at [facility] 141# 03/21/24  147.8# 11/05/23  164.5# UBW: 160-165# BMI: 27.58   Estimated Energy Needs  Kcals: 1920-2240 Protein: 77-96 g Fluid: 2.2 L  NUTRITION DIAGNOSIS: Per IP RD, Severe protein-calorie malnutrition POA Malnutrition Etiology: Chronic illness      Weight Loss: >10% weight loss - 6 months Energy Intake: < or equal to 75% of est energy needs for > or equal to 1 month Muscle Mass Depletion Summary: severe muscle wasting Body Fat Depletion Summary: moderate  fat wasting       MALNUTRITION DIAGNOSIS: Severe protein-calorie malnutrition    INTERVENTION:   Encouraged 2 days 3 carton then increase to 4 cartons.  Will place order for goals TF regimen for supplies with [facility] as PO feeds likely to decline with planned treatments.  Goal tube feeds Isosource 1.5 boluses (250 mL each) 6x daily with free water flushes 60 mL pre and 120 ml post bolus. This regimen will provide 2250 kcal, 102g protein, and 2226 mL free water.    Encouraged PO feeds as able to maintain swallowing function and supplement nourishment.  Encouraged free water flushes as needed if unable to tolerate volume with feeds.  Use of PO meds if safely able to swallow crushed pills in applesauce for ease of administration for spouse.  MONITORING, EVALUATION, GOAL: TF tolerance, weight, and labs   NEXT VISIT: Remote next week  Micheline Craven, RDN, LDN Registered Dietitian, [facility] Part Time Remote (Usual office hours: Tuesday-Thursday) Mobile: [phone] Remote Office: [phone]

## 2024-03-31 NOTE — Telephone Encounter (Signed)
 This patient was seen inpatient for a new diagnosis of locally advanced oropharyngeal cancer.  At the time we saw her, she and her family were not sure which location they would like to pursue the RT.  Please call patient/family and asked them if they have decided where they would like to receive RT.  If here, we will proceed with CT simulation in the near future. If locally, need to place referral.

## 2024-04-01 NOTE — Progress Notes (Signed)
 Location Information: Patient State (at time of visit): [place]  Patient Location (at time of visit):Home/Other Non-Medical  Provider Location: Non-Provider-Based Clinic (Clinic, non-hospital) Is provider licensed to provide clinical care in the current location/state of the patient? Yes   Consent:  Patient's identity was confirmed. Presenting condition or illness was discussed with the patient/personal representative. Current proposed treatment for presenting condition or illness was explained to patient/personal representative along with the likely benefits and any significant risks or complications associated with the provision of treatment by audio/video means. The patient/personal representative verbally authorized treatment to be provided by audio/video, which may include a limited review of patient's current health status, medication, or other treatment recommendations, patient education, and an opportunity to ask questions about condition and treatment. Verbal Consent Granted by Patient/Personal Representative:Yes   Visit Information: Modality: Audio-Only  Time Spent on Phone w/ Patient: 10 minutes The patient was contacted by phone.  I spoke with both the patient and her family.  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  This is a different diagnosis than what was shown on the frozen pathology.  I explained to them that additional testing the tumor was performed and showed no cancer diagnosis.  We discussed that her best line of treatment included surgery which may be followed by radiation.  I let them know that they are scheduled to follow-up with Dr. Floretta next week and I provided them the date, time, and location of the appointment.  They also let me know that they are going to follow-up at home medical oncology and radiation oncology appointments next week as well.  Thanks expressed an understanding of plan for appointments  and cancer care.

## 2024-04-04 DIAGNOSIS — M810 Age-related osteoporosis without current pathological fracture: Secondary | ICD-10-CM | POA: Insufficient documentation

## 2024-04-04 DIAGNOSIS — M19019 Primary osteoarthritis, unspecified shoulder: Secondary | ICD-10-CM | POA: Insufficient documentation

## 2024-04-05 ENCOUNTER — Other Ambulatory Visit: Payer: Self-pay | Admitting: Oncology

## 2024-04-05 MED ORDER — ISOSOURCE 1.5 CAL PO LIQD: ORAL | 12 refills | Status: AC

## 2024-04-05 NOTE — Progress Notes (Unsigned)
 " [facility] at [facility] [address] [place],  [place]  [address] [phone]  Clinic Day:  04/06/2024  Referring physician: Keren [name], MD   HISTORY OF PRESENT ILLNESS:  The patient is a 79 y.o. female who I have followed for anemia related to chronic renal insufficiency.  Of note, she had a partial left nephrectomy in 2017 for kidney cancer.  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  The patient claims that since the latter half of 2025, she has had posterior mouth pain. As it affected her ability to swallow, she has lost at least 25 pounds.  Scans done earlier this month clearly showed disease in the left base of her tongue, as well as fairly significant local disease extension.  Cervical lymphadenopathy was also seen.  Due to the patient having sudden shortness of breath, the patient was transported to [facility].  While there, the base of tongue biopsy was done, which revealed the mucoepidermoid carcinoma.  She is scheduled to see ENT at [facility] later today do discuss if surgical resection is possible.  She does have a lot of pain for which Dilaudid  1 mg has helped marginally.    PHYSICAL EXAM:  Blood pressure (!) 89/50, pulse 74, temperature 98.2 F (36.8 C), temperature source Oral, resp. rate 14, height 5' 1 (1.549 m), weight 142 lb 11.2 oz (64.7 kg), SpO2 100%. Wt Readings from Last 3 Encounters:  04/06/24 142 lb 11.2 oz (64.7 kg)  03/21/24 147 lb 11.2 oz (67 kg)  11/05/23 164 lb 8 oz (74.6 kg)   Body mass index is 26.96 kg/m. Performance status (ECOG): 1 Physical Exam Constitutional:      Appearance: Normal appearance. She is not ill-appearing.     Comments: She speaks more softly, with a partially garbled voice   HENT:     Mouth/Throat:     Mouth: Mucous membranes are moist.     Pharynx: Oropharynx is clear. No  oropharyngeal exudate or posterior oropharyngeal erythema.  Cardiovascular:     Rate and Rhythm: Normal rate and regular rhythm.     Heart sounds: No murmur heard.    No friction rub. No gallop.  Pulmonary:     Effort: Pulmonary effort is normal. No respiratory distress.     Breath sounds: Normal breath sounds. No wheezing, rhonchi or rales.  Abdominal:     General: Bowel sounds are normal. There is no distension.     Palpations: Abdomen is soft. There is no mass.     Tenderness: There is no abdominal tenderness.  Musculoskeletal:        General: No swelling.     Right lower leg: No edema.     Left lower leg: No edema.  Lymphadenopathy:     Cervical: Cervical adenopathy present.     Left cervical: Superficial cervical adenopathy present.     Upper Body:     Right upper body: No supraclavicular or axillary adenopathy.     Left upper body: No supraclavicular or axillary adenopathy.     Lower Body: No right inguinal adenopathy. No left inguinal adenopathy.  Skin:    General: Skin is warm.     Coloration: Skin is not jaundiced.     Findings: No lesion or rash.  Neurological:     General: No focal deficit present.     Mental Status: She is alert and  oriented to person, place, and time. Mental status is at baseline.  Psychiatric:        Mood and Affect: Mood normal.        Behavior: Behavior normal.        Thought Content: Thought content normal.   PATHOLOGY:  Her left tongue base biopsy on 03-24-24 revealed the following:  Final Diagnosis   A and B. LEFT TONGUE BASE, BIOPSIES:               Mucoepidermoid carcinoma, high-grade.              See comment.    Electronically signed by Margarie Cummins, MD on 03/30/2024 at 1401 EST  Comment   Block Stain Result  B1-2 p40 Positive in squamoid cells   B1-3 p16 Negative   B1-4 Mucicarmine  Positive   B1-5 Mucicarmine control Adequate     IHC and mucicarmine stains were performed with adequate controls.   After initial review of the H&E  slides and clinical history, the immunohistochemical and mucicarmine stains were required to establish the diagnosis.   Histologic sections show a nested to sheet-like proliferation of malignant squamoid and mucin-producing cells. Mitotic activity is high, with up to 12 mitoses per 10 high-power fields. Tumor necrosis is present and it is more pronounced on the frozen sections. No perineural invasion is identified in this limited biopsies. Immunohistochemical stains demonstrate that the squamoid cells are positive for p40, while the mucin-producing cells are highlighted by mucicarmine staining.    Histological features and IHC staining profile support the diagnosis of mucoepidermoid carcinoma. Based on the AFIP grading scheme with total score 8, the tumor is classified as a high-grade mucoepidermoid carcinoma.    The diagnosis of mucoepidermoid carcinoma was confirmed by another pathologist.   The final diagnosis of high-grade mucoepidermoid carcinoma was discussed with Dr. Isaiah Caldron Slijepcevic on 03/30/2024.      SCANS:  CT scans of her chest/abdomen/pelvis on 03-24-24 revealed the following: FINDINGS:   . Thoracic inlet: Large left supraclavicular lymph node measuring up to 17 mm in short axis, (series 6, image 3). Large retroesophageal lymph node measuring up to 12 mm in short axis (series 3, image 32).  . Chest wall/Axilla: Within normal limits.  . Central airways: Patent.  . Mediastinum/Hila: Unremarkable.  SABRA Heart: Heavy coronary arterial calcification. Aortic annular calcification and caseous mitral annular calcifications. Heart size within normal limits. No pericardial effusion.  . Vessels: Heavy calcific atherosclerosis. Aorta normal in caliber. No central pulmonary embolism.  . Lungs/Pleura: Biapical pleural-parenchymal scarring. Bronchiectasis with bibasilar subpleural reticulation, possibly fibrotic change.   . Liver: No suspicious focal findings.  . Gallbladder/Biliary: Vicarious  excretion of contrast.  . Spleen: Unremarkable.  . Pancreas: Unremarkable.  . Adrenals: Unremarkable.  . Kidneys: Cyst with minimal complexity in the left upper pole and measures 1.5 cm. Additional bilateral subcentimeter hypodensities which are too small to characterize.   SABRA Peritoneum/Mesenteries/Extraperitoneum: No free air. No free fluid or loculated drainable collection. No pathologically enlarged lymph nodes.  . Gastrointestinal tract: Normal appendix. No evidence of obstruction.   . Ureters: Unremarkable.  . Bladder: Partially decompressed bladder with a locule of gas layering and anti-dependently.  . Reproductive System: Unremarkable.   . Vascular: Unremarkable.  . Musculoskeletal: Age-indeterminate compression fracture of L2 with approximately 30% anterior height loss, new when compared to CT abdomen and pelvis from 10/09/2017. Polyarticular degenerative changes. No aggressive focal bony lesions. Abdominal wall soft tissues unremarkable.   Impression  1.  Likely metastatic cervical lymphadenopathy without evidence of disease within the chest abdomen or pelvis. 2.  Minimal complexity cyst within the left upper pole, consider renal ultrasound if not already performed. 3.  Locule of gas within the bladder, may relate to recent instrumentation. If there is clinical concern for cystitis consider correlation with urinalysis. 4.  Age-indeterminate compression fracture of L2 with approximately 30% anterior height loss.  LABS:      Latest Ref Rng & Units 04/06/2024    9:21 AM 03/21/2024   10:35 AM 11/05/2023    1:52 PM  CBC  WBC 4.0 - 10.5 K/uL 4.6  3.8  4.6   Hemoglobin 12.0 - 15.0 g/dL 89.5  89.4  89.0   Hematocrit 36.0 - 46.0 % 33.0  31.7  33.7   Platelets 150 - 400 K/uL 209  183  176       Latest Ref Rng & Units 04/06/2024    9:21 AM 03/21/2024   10:35 AM 11/05/2023    1:52 PM  CMP  Glucose 70 - 99 mg/dL 768  821  762   BUN 8 - 23 mg/dL 25  14  34   Creatinine 0.44 - 1.00 mg/dL  8.90  8.88  8.36   Sodium 135 - 145 mmol/L 140  140  137   Potassium 3.5 - 5.1 mmol/L 4.1  4.1  4.2   Chloride 98 - 111 mmol/L 104  106  102   CO2 22 - 32 mmol/L 28  25  22    Calcium  8.9 - 10.3 mg/dL 89.0  89.9  89.7   Total Protein 6.5 - 8.1 g/dL 6.5  6.6  6.5   Total Bilirubin 0.0 - 1.2 mg/dL 0.7  0.6  0.8   Alkaline Phos 38 - 126 U/L 92  78  98   AST 15 - 41 U/L 35  54  33   ALT 0 - 44 U/L 17  25  25      ASSESSMENT & PLAN:  Assessment/Plan:  A 79 y.o. female who appears to have locally advanced high grade mucoepidermoid carcinoma involving the left base of her tongue.  As mentioned previously, she is scheduled to see ENT at [facility] today to discuss possible surgical intervention.  When looking at her CT scans and conferring with radiation oncology, I am very concerned as to whether she will be considered a surgical candidate.  I am also concerned about her possibly having occult metastatic disease for which I would recommend a PET scan for further evaluation.  Of note, this scan was scheduled, but due to her acute shortness of breath and being immediately transferred to [facility], this scan was not done.  I have already spoken with Dr. Floretta at [facility], who will see her later today.  Definitely, a collaborative multidisciplinary effort will be necessary to determine the best way to manage her disease moving forward.  Ultimately, my concern is whether there is any chance for her disease to be cured.  I will tentatively see this patient back in 2 weeks for repeat clinical assessment.  That visit may change depending on how her discussion with ENT goes later this afternoon.  With respect to her cancer related pain, I have told the patient to take Dilaudid  2 mg every 6 hours as needed for breakthrough pain.  A prescription for this will also be sent.  I will also give her Dilaudid  1 mg and Decadron  4 mg IV in  clinic today for immediate pain relief.   The patient and her family understand all the plans discussed today and are in agreement with them.  Iban Utz DELENA Kerns, MD       "

## 2024-04-06 ENCOUNTER — Other Ambulatory Visit: Payer: Self-pay | Admitting: Oncology

## 2024-04-06 ENCOUNTER — Inpatient Hospital Stay

## 2024-04-06 ENCOUNTER — Encounter: Payer: Self-pay | Admitting: Oncology

## 2024-04-06 ENCOUNTER — Ambulatory Visit: Admission: RE | Admit: 2024-04-06 | Source: Ambulatory Visit | Admitting: Radiation Oncology

## 2024-04-06 ENCOUNTER — Inpatient Hospital Stay: Admitting: Oncology

## 2024-04-06 ENCOUNTER — Other Ambulatory Visit: Payer: Self-pay | Admitting: Pharmacist

## 2024-04-06 VITALS — BP 112/59 | HR 63

## 2024-04-06 VITALS — BP 89/50 | HR 74 | Temp 98.2°F | Resp 14 | Ht 61.0 in | Wt 142.7 lb

## 2024-04-06 DIAGNOSIS — N189 Chronic kidney disease, unspecified: Secondary | ICD-10-CM | POA: Diagnosis not present

## 2024-04-06 DIAGNOSIS — D500 Iron deficiency anemia secondary to blood loss (chronic): Secondary | ICD-10-CM

## 2024-04-06 DIAGNOSIS — C109 Malignant neoplasm of oropharynx, unspecified: Secondary | ICD-10-CM | POA: Insufficient documentation

## 2024-04-06 DIAGNOSIS — D631 Anemia in chronic kidney disease: Secondary | ICD-10-CM

## 2024-04-06 DIAGNOSIS — N1832 Chronic kidney disease, stage 3b: Secondary | ICD-10-CM

## 2024-04-06 DIAGNOSIS — J358 Other chronic diseases of tonsils and adenoids: Secondary | ICD-10-CM

## 2024-04-06 LAB — CMP (CANCER CENTER ONLY)
ALT: 17 U/L (ref 0–44)
AST: 35 U/L (ref 15–41)
Albumin: 3.6 g/dL (ref 3.5–5.0)
Alkaline Phosphatase: 92 U/L (ref 38–126)
Anion gap: 9 (ref 5–15)
BUN: 25 mg/dL — ABNORMAL HIGH (ref 8–23)
CO2: 28 mmol/L (ref 22–32)
Calcium: 10.9 mg/dL — ABNORMAL HIGH (ref 8.9–10.3)
Chloride: 104 mmol/L (ref 98–111)
Creatinine: 1.09 mg/dL — ABNORMAL HIGH (ref 0.44–1.00)
GFR, Estimated: 52 mL/min — ABNORMAL LOW
Glucose, Bld: 231 mg/dL — ABNORMAL HIGH (ref 70–99)
Potassium: 4.1 mmol/L (ref 3.5–5.1)
Sodium: 140 mmol/L (ref 135–145)
Total Bilirubin: 0.7 mg/dL (ref 0.0–1.2)
Total Protein: 6.5 g/dL (ref 6.5–8.1)

## 2024-04-06 LAB — CBC WITH DIFFERENTIAL (CANCER CENTER ONLY)
Abs Immature Granulocytes: 0.01 K/uL (ref 0.00–0.07)
Basophils Absolute: 0 K/uL (ref 0.0–0.1)
Basophils Relative: 1 %
Eosinophils Absolute: 0.1 K/uL (ref 0.0–0.5)
Eosinophils Relative: 2 %
HCT: 33 % — ABNORMAL LOW (ref 36.0–46.0)
Hemoglobin: 10.4 g/dL — ABNORMAL LOW (ref 12.0–15.0)
Immature Granulocytes: 0 %
Lymphocytes Relative: 20 %
Lymphs Abs: 0.9 K/uL (ref 0.7–4.0)
MCH: 27.7 pg (ref 26.0–34.0)
MCHC: 31.5 g/dL (ref 30.0–36.0)
MCV: 88 fL (ref 80.0–100.0)
Monocytes Absolute: 0.3 K/uL (ref 0.1–1.0)
Monocytes Relative: 7 %
Neutro Abs: 3.3 K/uL (ref 1.7–7.7)
Neutrophils Relative %: 70 %
Platelet Count: 209 K/uL (ref 150–400)
RBC: 3.75 MIL/uL — ABNORMAL LOW (ref 3.87–5.11)
RDW: 14.9 % (ref 11.5–15.5)
WBC Count: 4.6 K/uL (ref 4.0–10.5)
nRBC: 0 % (ref 0.0–0.2)

## 2024-04-06 MED ORDER — HYDROMORPHONE HCL 1 MG/ML IJ SOLN
0.5000 mg | INTRAMUSCULAR | Status: DC | PRN
  Administered 2024-04-06: 0.5 mg via INTRAVENOUS
  Filled 2024-04-06: qty 1

## 2024-04-06 MED ORDER — DEXAMETHASONE SOD PHOSPHATE PF 10 MG/ML IJ SOLN
4.0000 mg | Freq: Once | INTRAMUSCULAR | Status: AC
  Administered 2024-04-06: 4 mg via INTRAVENOUS
  Filled 2024-04-06: qty 1

## 2024-04-06 NOTE — Progress Notes (Unsigned)
 " Cardiology Office Note:    Date:  04/06/2024   ID:  Julia Logan, DOB February 11, 1946, MRN [id number]  PCP:  Keren [name], MD  Cardiologist:  Redell Leiter, MD    Referring MD: Keren [name], MD    ASSESSMENT:    No diagnosis found. PLAN:    In order of problems listed above:  ***   Next appointment: ***   Medication Adjustments/Labs and Tests Ordered: Current medicines are reviewed at length with the patient today.  Concerns regarding medicines are outlined above.  No orders of the defined types were placed in this encounter.  No orders of the defined types were placed in this encounter.    History of Present Illness:    Julia Logan is a 79 y.o. female with a hx of  CAD with multiple PCI's for in-stent restenosis of the right coronary artery hypertension hyperlipidemia stage III CKD and iron deficiency anemia last seen 04/03/2023.  She follows with hematology for anemia related to her CKD her last hemoglobin 03/21/2024 10.5 creatinine 1.11. Compliance with diet, lifestyle and medications: *** Past Medical History:  Diagnosis Date   Abnormal nuclear stress test 09/09/2017   Acid reflux disease 05/05/2017   Anemia of chronic kidney failure, unspecified stage 01/16/2022   Atrophic vaginitis 08/14/2016   Cancer of kidney (HCC) 10/04/2015   Cervical spondylosis without myelopathy    Chronic pain 05/05/2017   Overview:   back and right side     CKD (chronic kidney disease) stage 3, GFR 30-59 ml/min (HCC) 10/27/2017   Colon polyps    Coronary artery disease involving native coronary artery of native heart with angina pectoris 01/01/2017   Diabetes mellitus (HCC) 12/31/2016   Diverticulitis of colon (without mention of hemorrhage)(562.11)    ESA (erythropoietin stimulating agent) anemia management patient 05/05/2017   Hard of hearing 05/05/2017   Heart disease    History of heart artery stent 04/02/2023   History of kidney cancer    History of nephrectomy  03/19/2016   History of recurrent UTI (urinary tract infection) 05/05/2017   Hyperlipidemia 05/05/2017   Hypertension 12/31/2016   Iron deficiency anemia due to chronic blood loss 12/19/2021   Iron deficiency anemia, unspecified    Malignant neoplasm of left kidney (HCC) 10/09/2017   Osteoarthritis of shoulder    Osteoporosis    Restless legs syndrome (RLS)    Sciatica, right side 05/05/2017   Stable angina pectoris 05/11/2017   Urinary tract infection 09/24/2015    Current Medications: Active Medications[1]    EKGs/Labs/Other Studies Reviewed:    The following studies were reviewed today:  Cardiac Studies & Procedures   ______________________________________________________________________________________________ CARDIAC CATHETERIZATION  CARDIAC CATHETERIZATION 11/09/2017  Conclusion  Prox RCA to Mid RCA lesion is 25% stenosed.  Balloon angioplasty was performed.  Previously placed Dist RCA-1 stent (unknown type) is widely patent.  Dist RCA-2 lesion is 99% stenosed.  LV end diastolic pressure is normal.  Scoring balloon angioplasty was performed using a BALLOON WOLVERINE 3.00X6.  Post intervention, there is a 5% residual stenosis.  1. Single vessel obstructive CAD. There is a 99% stenosis in the distal RCA (DES) stent 2. Otherwise no significant CAD 3. Normal LVEDP 4. Successful PTCA of the distal RCA with Wolverine scoring balloon  Plan: continue DAPT. Patient is a candidate for same day discharge. If patient has recurrent restenosis of the same lesion would need to consider placing another stent.  Recommend uninterrupted dual antiplatelet therapy with Aspirin  81mg  daily and  Prasugrel  10mg  daily for a minimum of 12 months (ACS - Class I recommendation).  Findings Coronary Findings Diagnostic  Dominance: Right  Left Main Vessel is normal in caliber. Vessel is angiographically normal.  Left Anterior Descending Vessel is normal in caliber. Vessel is  angiographically normal.  First Diagonal Branch Vessel is normal and large in size. Vessel is angiographically normal.  Ramus Intermedius Vessel is normal in caliber. Vessel is angiographically normal.  Left Circumflex Vessel is normal in caliber. Vessel is angiographically normal.  Right Coronary Artery Prox RCA to Mid RCA lesion is 25% stenosed. Previously placed Dist RCA-1 stent (unknown type) is widely patent. Dist RCA-2 lesion is 99% stenosed. The lesion is focal. The lesion was previously treated.  Intervention  Dist RCA-2 lesion Angioplasty [place] GUIDE 6FR JR4 guide catheter was inserted. WIRE ASAHI PROWATER 180CM guidewire used to cross lesion. Scoring balloon angioplasty was performed using a BALLOON WOLVERINE 3.00X6. Maximum pressure: 8 atm. Inflation time: 30 sec. Post-Intervention Lesion Assessment The intervention was successful. Pre-interventional TIMI flow is 2. Post-intervention TIMI flow is 3. No complications occurred at this lesion. There is a 5% residual stenosis post intervention.   CARDIAC CATHETERIZATION  CARDIAC CATHETERIZATION 01/01/2017  Conclusion  Mid RCA to Dist RCA lesion, 0 %stenosed.  Prox RCA to Mid RCA lesion, 25 %stenosed.  LV end diastolic pressure is normal.  A STENT RESOLUTE ONYX 2.75X8 drug eluting stent was successfully placed, and overlaps previously placed stent.  Dist RCA lesion, 95 %stenosed.  Post intervention, there is a 0% residual stenosis.  1. Single vessel obstructive CAD. Stenosis at distal margin of prior stent placement. 2. Normal LVEDP 3. Successful stenting of the distal RCA with short, overlapping DES  Plan: continue DAPT for at least one year. Will plan on same day discharge.  Findings Coronary Findings Diagnostic  Dominance: Right  Left Main Vessel was injected. Vessel is normal in caliber. Vessel is angiographically normal.  Left Anterior Descending Vessel was injected. Vessel is normal in  caliber. Vessel is angiographically normal.  First Diagonal Branch Vessel is normal and large in size. Vessel is angiographically normal.  Ramus Intermedius Vessel was injected. Vessel is normal in caliber. Vessel is angiographically normal.  Left Circumflex Vessel was injected. Vessel is normal in caliber. Vessel is angiographically normal.  Right Coronary Artery  Previously placed Mid RCA to Dist RCA drug eluting stent is widely patent. The lesion is focal.  Intervention  Dist RCA lesion Angioplasty Lesion crossed with guidewire using a WIRE ASAHI PROWATER 180CM. Pre-stent angioplasty was performed using a BALLOON SAPPHIRE 2.5X12. A STENT RESOLUTE ONYX 2.75X8 drug eluting stent was successfully placed, and overlaps previously placed stent. Stent strut is well apposed. Post-stent angioplasty was performed using a BALLOON Stewart EMERGE MR 3.0X6. Maximum pressure: 18 atm. The pre-interventional distal flow is normal (TIMI 3).  The post-interventional distal flow is normal (TIMI 3). The intervention was successful . No complications occurred at this lesion. The lesion was at the distal margin of the prior placed stents. There appeared to be one layer of stent involved in the lesion. We placed a short 2.75 x 8 mm DES in an overlapping fashion and just prior to the distal RCA bifurcation. There is a 0% residual stenosis post intervention.   STRESS TESTS  MYOCARDIAL PERFUSION IMAGING 09/07/2017  Interpretation Summary  Nuclear stress EF: 58%.  Small region of ischemia in the inferior (base, mid), inferoseptal (base) walls. Note, underlying small bowel activity makes evaluation difficult.  This is  a low risk study. No prior scan to compare            ______________________________________________________________________________________________          Recent Labs: 04/06/2024: ALT 17; BUN 25; Creatinine 1.09; Hemoglobin 10.4; Platelet Count 209; Potassium 4.1; Sodium 140  Recent  Lipid Panel    Component Value Date/Time   CHOL 143 10/15/2017 1310   TRIG 106 10/15/2017 1310   HDL 54 10/15/2017 1310   CHOLHDL 2.6 10/15/2017 1310   LDLCALC 68 10/15/2017 1310    Physical Exam:    VS:  There were no vitals taken for this visit.    Wt Readings from Last 3 Encounters:  04/06/24 142 lb 11.2 oz (64.7 kg)  03/21/24 147 lb 11.2 oz (67 kg)  11/05/23 164 lb 8 oz (74.6 kg)     GEN: *** Well nourished, well developed in no acute distress HEENT: Normal NECK: No JVD; No carotid bruits LYMPHATICS: No lymphadenopathy CARDIAC: ***RRR, no murmurs, rubs, gallops RESPIRATORY:  Clear to auscultation without rales, wheezing or rhonchi  ABDOMEN: Soft, non-tender, non-distended MUSCULOSKELETAL:  No edema; No deformity  SKIN: Warm and dry NEUROLOGIC:  Alert and oriented x 3 PSYCHIATRIC:  Normal affect    Signed, Redell Leiter, MD  04/06/2024 10:03 AM    [facility]     [1]  No outpatient medications have been marked as taking for the 04/07/24 encounter (Appointment) with Leiter Redell PARAS, MD.   "

## 2024-04-07 ENCOUNTER — Other Ambulatory Visit: Payer: Self-pay | Admitting: Oncology

## 2024-04-07 ENCOUNTER — Inpatient Hospital Stay: Admitting: Dietician

## 2024-04-07 ENCOUNTER — Telehealth: Payer: Self-pay

## 2024-04-07 ENCOUNTER — Ambulatory Visit: Admitting: Cardiology

## 2024-04-07 MED ORDER — DEXAMETHASONE 4 MG PO TABS: 4.0000 mg | ORAL_TABLET | Freq: Every day | ORAL | 0 refills | Status: DC

## 2024-04-07 MED ORDER — PREDNISONE 20 MG PO TABS: 20.0000 mg | ORAL_TABLET | Freq: Every day | ORAL | 0 refills | Status: DC

## 2024-04-07 MED ORDER — HYDROMORPHONE HCL 2 MG PO TABS: 2.0000 mg | ORAL_TABLET | Freq: Four times a day (QID) | ORAL | 0 refills | Status: AC | PRN

## 2024-04-07 NOTE — Telephone Encounter (Signed)
 Pt's daughter called to ask a few questions. First- they did see Dr @ [facility] yesterday and they agreed PET needs to be done. The soonest [facility] could get PET was the 30th. She wants to know if we could arrange for it to be sooner somewhere else? Second - Dr Ezzard told them he would send in hydromorphone  for pain, planned top increase it per daughter. Original prescription was sent by PCP for 15 tabs - written as hydromorphone  2mg  TID on 03/29/2024. Third - she also mentioned that Dr Ezzard was going to send in steroid. They haven't  heard anything from [facility] on [facility].   Dr Ezzard: prescriptions sent now (@1025 ).

## 2024-04-07 NOTE — Progress Notes (Signed)
 Nutrition Follow-up:  Patient is a 79 y.o. female with PMHx of RCC s/p partial nephrectomy in 2017, CAD s/p PCI on asa monotherapy, chronic stable angina, NIDDM2, HTN, HLD, CKD recently hospitalized with progressive dysphagia and dyspnea.  She was seen at  [facility]. CT imaging was obtained by her PCP and she was found to have locally aggressive base of tongue neoplasm with extension into both lingual tonsils as the left palatine tonsil; bilateral enlarged and likely malignant cervical adenopathy; left supraclavicular/ base of neck adenopathy; concerning upper mediastinal/ paraesophageal lymph node.  During hospitalization she was evaluated by [facility] and PO safety recommendations were for pureed foods.  Daughter relayed a few bites salmon, pureed foods, smoothie. Taking pills with pudding. Currently 90% of nourishment is via PEG.  She is scheduled for PET for treatments planning and has met with ENT surgeon at [facility] and surgery is a possibility.    Currently tolerating 4 cartons Isosource 1.5.   They tried to do 2 cartons at a feed but patient didn't tolerate and had loose stools.    Daughter relayed they has 52 carton on hand, encouraged to reach out to customer service with increased dose to insure they have updated ordered.  Medications: Miralax, dilaudid  and tylenol  for pain, not taking the oxycodone  currently  Labs: 04/07/23 BUN 25 Creat 1.09, GFR 52  Anthropometrics: daughter reports 20# weight loss since 11/2023  Height: 61 (64 at [facility]) Weight: 04/06/24  142.7# 03/29/23 at [facility] 141# 03/21/24  147.8# 11/05/23  164.5# UBW: 160-165# BMI: 26.96   Estimated Energy Needs  Kcals: 1920-2240 Protein: 77-96 g Fluid: 2.2 L  NUTRITION DIAGNOSIS: Per IP RD, Severe protein-calorie malnutrition POA Malnutrition Etiology: Chronic illness      Weight Loss: >10% weight loss - 6 months Energy Intake: < or equal to 75% of est energy needs for > or equal to 1 month Muscle Mass Depletion Summary: severe  muscle wasting Body Fat Depletion Summary: moderate fat wasting       MALNUTRITION DIAGNOSIS: Severe protein-calorie malnutrition    INTERVENTION:   Encouraged increase to 5 cartons for 3 days then to goal if able.    Goal tube feeds Isosource 1.5 boluses (250 mL each) 6x daily with free water flushes 60 mL pre and 120 ml post bolus. This regimen will provide 2250 kcal, 102g protein, and 2226 mL free water.    Encouraged PO feeds as able to maintain swallowing function and supplement nourishment.  Encouraged increased free water flushes as needed if unable to tolerate volume with feeds.  Emailed chocolate peanut butter pudding recipe to daughter at [email or web address]  MONITORING, EVALUATION, GOAL: TF tolerance, weight, and labs   NEXT VISIT: Remote next week  Micheline Craven, RDN, LDN Registered Dietitian, [facility] Part Time Remote (Usual office hours: Tuesday-Thursday) Mobile: [phone] Remote Office: [phone]

## 2024-04-07 NOTE — Telephone Encounter (Signed)
-----   Message from Lonni Archer, MD sent at 04/06/2024  4:24 PM EST ----- Please arrange: 1. Schedule STAT whole body PET/CT 2. Add PD-L1 testing to pathology 03/24/2024 (Specimen # [id number]) 3. F/U telephone visit to review results

## 2024-04-07 NOTE — Telephone Encounter (Signed)
 Pt scheduled for pet scan on 1/30 at 1pm and phone call with Dr. Floretta on 2/2. Called patient and left voicemail regarding appointments.

## 2024-04-11 NOTE — Telephone Encounter (Signed)
-----   Message from Montie SQUIBB, RN sent at 04/08/2024  2:00 PM EST -----  ----- Message ----- From: Tanda Jama Nicholaus DOUGLAS, MD Sent: 04/08/2024  11:47 AM EST To: [facility] Clinical Support  The renal ultrasound is stable as discussed.

## 2024-04-14 ENCOUNTER — Other Ambulatory Visit: Payer: Self-pay | Admitting: Hematology and Oncology

## 2024-04-14 ENCOUNTER — Telehealth: Payer: Self-pay | Admitting: Dietician

## 2024-04-14 ENCOUNTER — Inpatient Hospital Stay: Admitting: Dietician

## 2024-04-14 ENCOUNTER — Inpatient Hospital Stay

## 2024-04-14 DIAGNOSIS — J358 Other chronic diseases of tonsils and adenoids: Secondary | ICD-10-CM

## 2024-04-14 MED ORDER — DEXAMETHASONE 4 MG PO TABS: 4.0000 mg | ORAL_TABLET | Freq: Every day | ORAL | 0 refills | Status: AC

## 2024-04-14 MED ORDER — OXYCODONE HCL 5 MG PO TABS: 5.0000 mg | ORAL_TABLET | ORAL | 0 refills | Status: AC | PRN

## 2024-04-14 NOTE — Progress Notes (Signed)
 [facility] Clinical Social Work  Clinical Social Work was referred by medical provider for need for community resources.  Clinical Social Worker contacted patient's daughter to offer support and assess for needs. Patient / Family primary need is assistance with care giving should family members not be able to assist, and assistance with supplies related to tube flushing's, specifically distilled water.   CSW sent information through email [email or web address] for [facility] for review of eligibility and if funds would be helpful. CSW provided education on custodial care. Custodial Care available, but all OOP cost.  Reviewed resource list, none identified Messaged [facility] Dietician to inquire about any insurance based resources for tube flushes, awaiting response CSW sent message to Cancer Services to see if their organization would be able to supply any assistance for distilled or filtered water for flushing. And care giving.  Awaiting response   Follow Up Plan: Awaiting response from [facility] and [facility] on any available resources.    Lizbeth Sprague, LCSW  Clinical Social Worker [facility]

## 2024-04-14 NOTE — Progress Notes (Signed)
 Nutrition Follow-up:  Patient is a 79 y.o. female with PMHx of RCC s/p partial nephrectomy in 2017, CAD s/p PCI on asa monotherapy, chronic stable angina, NIDDM2, HTN, HLD, CKD recently hospitalized with progressive dysphagia and dyspnea.  She was seen at  [facility]. CT imaging was obtained by her PCP and she was found to have locally aggressive base of tongue neoplasm with extension into both lingual tonsils as the left palatine tonsil; bilateral enlarged and likely malignant cervical adenopathy; left supraclavicular/ base of neck adenopathy; concerning upper mediastinal/ paraesophageal lymph node.  During hospitalization she was evaluated by [facility] and PO safety recommendations were for pureed foods.  Daughter called back on way home from [facility] to relay patient is currently tolerating 6 cartons Isosource 1.5.   She does 1.5 carton 4 times a day.  With the appointments and clogged tube not sure they will get all feeds in today.  She isn't sure how much formula they have on hand.  She is also not sure where the medical necessity form was faxed.  She is going to reach out to [place] who is the [employer] rep for [place] who initially set up supply order to find out why form is needed for just a rate increase.   Medications: Miralax, dilaudid  and tylenol  for pain, not taking the oxycodone  currently  Labs: 04/07/23 BUN 25 Creat 1.09, GFR 52  Anthropometrics: daughter reports 20# weight loss since 11/2023  Height: 61 (64 at [facility]) Weight: 04/06/24  142.7# 03/29/23 at [facility] 141# 03/21/24  147.8# 11/05/23  164.5# UBW: 160-165# BMI: 26.96   Estimated Energy Needs  Kcals: 1920-2240 Protein: 77-96 g Fluid: 2.2 L  NUTRITION DIAGNOSIS: Per IP RD, Severe protein-calorie malnutrition POA Malnutrition Etiology: Chronic illness      Weight Loss: >10% weight loss - 6 months Energy Intake: < or equal to 75% of est energy needs for > or equal to 1 month Muscle Mass Depletion Summary: severe muscle wasting Body Fat  Depletion Summary: moderate fat wasting       MALNUTRITION DIAGNOSIS: Severe protein-calorie malnutrition    INTERVENTION:   Goal tube feeds Isosource 1.5 boluses (375 mL each) 4x daily with free water flushes 60 mL pre and 120 ml post bolus with 200 ml TID between feeds. This regimen will provide 2250 kcal, 102g protein, and 2400 mL free water.    Will call back later when they are home for status on supplies and DME response regarding fax.  MONITORING, EVALUATION, GOAL: TF tolerance, weight, and labs   NEXT VISIT: Remote next week  Micheline Craven, RDN, LDN Registered Dietitian, [facility] Part Time Remote (Usual office hours: Tuesday-Thursday) Mobile: [phone] Remote Office: [phone]

## 2024-04-14 NOTE — Telephone Encounter (Signed)
 Reached out to Starla (daughter) for remote follow up.  She couldn't talk now as she was at [facility] with patient and her sister to have feeding tube unclogged.  She was concern with increase in TF as DME relayed they would need to have a statement of medical need signed by MD. She asked me to check to see if MD received the fax.   I offered to call back later in day to monitor progress.    Micheline Craven, RDN, LDN Registered Dietitian, [facility] Part Time Remote (Usual office hours: Tuesday-Thursday) Mobile: [phone]

## 2024-04-14 NOTE — Telephone Encounter (Signed)
 Called Starla patient's daughter.  They currently have 85 cartons of formula.  She has not yet heard back from [facility] the [place] rep for [employer].  Dan would prefer to have their DME switched to our [facility] rep.   She also relayed that they have well water and water to know if their was any insurance reimbursement for the water flushes.  I am unaware of any coverage but will look into it with DME.  I offered to send email with MD, CME, and RN Navigator so [employer] could send a secure email with form for MD signature.  Will follow up next week.  Micheline Craven, RDN, LDN Registered Dietitian, [facility] Part Time Remote (Usual office hours: Tuesday-Thursday) Mobile: [phone]

## 2024-04-14 NOTE — Progress Notes (Signed)
" °  Rapid Diagnostic Service for Malignancies [facility]  Diagnostic Nurse Navigator Treatment Team Hand-Off Note  04/14/24  Patient Name:  Julia Logan Patient MRN:  [id number] Patient DOB:  1945/09/03   Patient Care Team: Keren [name], MD as PCP - General (Internal Medicine) Ezzard Valaria LABOR, MD as Consulting Physician (Oncology) Caro Selinda LABOR, RN as Oncology Nurse Navigator  Chief Complaint mucoepidermoid carcinoma of the tongue   Oncology History   No problem history exists.    Cancer Staging  No matching staging information was found for the patient.   SDOH Screening and Interventions Updated:  Yes  SDOH Screenings   Food Insecurity: Low Risk (03/23/2024)   Received from [facility]  Housing: Low Risk (03/23/2024)   Received from [facility]  Transportation Needs: No Transportation Needs (03/23/2024)   Received from [facility]  Utilities: Low Risk (03/23/2024)   Received from [facility]  Depression (PHQ2-9): Low Risk (03/21/2024)  Financial Resource Strain: Low Risk (03/23/2024)   Received from [facility]  Social Connections: Unknown (03/23/2024)   Received from [facility]  Tobacco Use: Low Risk (04/07/2024)   Received from [facility]     Genetics Assessment Completed:  Yes Genetics Referral Made:  not applicable  Care Team Updated:  Yes  "

## 2024-04-19 ENCOUNTER — Inpatient Hospital Stay: Admitting: Dietician

## 2024-04-19 ENCOUNTER — Other Ambulatory Visit: Payer: Self-pay | Admitting: Oncology

## 2024-04-19 ENCOUNTER — Ambulatory Visit
Admission: RE | Admit: 2024-04-19 | Discharge: 2024-04-19 | Disposition: A | Payer: Self-pay | Source: Ambulatory Visit | Attending: Oncology | Admitting: Oncology

## 2024-04-19 DIAGNOSIS — C760 Malignant neoplasm of head, face and neck: Secondary | ICD-10-CM

## 2024-04-19 NOTE — Progress Notes (Signed)
 Nutrition Follow-up:  Reached out to daughter Dan on preferred phone#.  Daughter relayed that patient went to [place] on Sunday.  Lots of concern with feeds. Isosource not on formulary, RD wasn't on site for compatible replacement, no enfit syringes.  Daughter also reports they are not giving extra free water and told her not to worry that she is getting what she needs.  Daughter is hoping patient will be discharged tonight and they will met with Dr. Ezzard tomorrow.  Medications: Miralax, dilaudid  and tylenol  for pain, not taking the oxycodone  currently  Labs: 04/07/23 BUN 25 Creat 1.09, GFR 52  Anthropometrics: daughter reports 20# weight loss since 11/2023  Height: 61 (64 at [facility]) Weight: 04/06/24  142.7# 03/29/23 at [facility] 141# 03/21/24  147.8# 11/05/23  164.5# UBW: 160-165# BMI: 26.96   Estimated Energy Needs  Kcals: 1920-2240 Protein: 77-96 g Fluid: 2.2 L  NUTRITION DIAGNOSIS: Per IP RD, Severe protein-calorie malnutrition POA Malnutrition Etiology: Chronic illness      Weight Loss: >10% weight loss - 6 months Energy Intake: < or equal to 75% of est energy needs for > or equal to 1 month Muscle Mass Depletion Summary: severe muscle wasting Body Fat Depletion Summary: moderate fat wasting       MALNUTRITION DIAGNOSIS: Severe protein-calorie malnutrition    INTERVENTION:   Goal tube feeds Isosource 1.5 boluses (375 mL each) 4x daily with free water flushes 1 60 ml syringe prior 2 syringes post feeds and 8oz water between feeds or with meds TID (at least extra bottle and half). This regimen will provide 2250 kcal, 102g protein, and 2400 mL free water.    MONITORING, EVALUATION, GOAL: TF tolerance, weight, and labs   NEXT VISIT: Remote next week  Micheline Craven, RDN, LDN Registered Dietitian, [facility] Part Time Remote (Usual office hours: Tuesday-Thursday) Mobile: [phone] Remote Office: [phone]

## 2024-04-19 NOTE — Progress Notes (Signed)
 [facility] CSW Progress Note  Clinical Social Worker was informed [employer] may be able to provide a 2 gallon bottle of distilled water and/or a [employer] gift card to assist with cost of water for treatment. CSW awaiting response from daughter to see if a referral will need to be completed.    Lizbeth Sprague, LCSW Clinical Social Worker [facility]

## 2024-04-19 NOTE — Progress Notes (Unsigned)
 " [facility] [address] [place],  [place]  [address] [phone]  Clinic Day:  04/20/2024  Referring physician: Keren [name], MD   HISTORY OF PRESENT ILLNESS:  The patient is a 79 y.o. 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  Recent scans also showed extensive regional nodal spread.  She was recently seen by ENT as [facility], who felt that performing extensive surgery at this point in her life would be extreme.  She comes in today to go over her recent PET scan to determine if she has more extensive disease than initially thought.  Since her last visit, the patient was briefly hospitalized due to dehydration and failure to thrive.  Of note, she now has a feeding tube placed.  Her family states that her ability to swallow anything over these past few days has been significantly diminished.  Her locally advanced cancer is also causing her pain, but this has been managed with opioid analgesics.  In addition to going over her PET scan images today, she comes in today her family to determine what else could potentially be done for her disease management.  PHYSICAL EXAM:  Blood pressure 136/60, pulse 60, temperature 98.3 F (36.8 C), temperature source Oral, resp. rate 14, height 5' 1 (1.549 m), weight 141 lb (64 kg), SpO2 100%. Wt Readings from Last 3 Encounters:  04/20/24 141 lb (64 kg)  04/06/24 142 lb 11.2 oz (64.7 kg)  03/21/24 147 lb 11.2 oz (67 kg)   Body mass index is 26.64 kg/m. Performance status (ECOG): 1 Physical Exam Constitutional:      Appearance: Normal appearance. She is not ill-appearing.     Comments: She speaks more softly, with a partially garbled voice   HENT:     Mouth/Throat:     Mouth: Mucous membranes are moist.     Pharynx: Oropharynx is clear. No oropharyngeal exudate or posterior oropharyngeal erythema.  Cardiovascular:     Rate  and Rhythm: Normal rate and regular rhythm.     Heart sounds: No murmur heard.    No friction rub. No gallop.  Pulmonary:     Effort: Pulmonary effort is normal. No respiratory distress.     Breath sounds: Normal breath sounds. No wheezing, rhonchi or rales.  Abdominal:     General: Bowel sounds are normal. There is no distension.     Palpations: Abdomen is soft. There is no mass.     Tenderness: There is no abdominal tenderness.  Musculoskeletal:        General: No swelling.     Right lower leg: No edema.     Left lower leg: No edema.  Lymphadenopathy:     Cervical: Cervical adenopathy present.     Left cervical: Superficial cervical adenopathy present.     Upper Body:     Right upper body: No supraclavicular or axillary adenopathy.     Left upper body: No supraclavicular or axillary adenopathy.     Lower Body: No right inguinal adenopathy. No left inguinal adenopathy.  Skin:    General: Skin is warm.     Coloration: Skin is not jaundiced.     Findings: No lesion or rash.  Neurological:     General: No focal deficit present.     Mental Status: She is alert and oriented to person, place, and time. Mental status is at baseline.  Psychiatric:  Mood and Affect: Mood normal.        Behavior: Behavior normal.        Thought Content: Thought content normal.   PATHOLOGY:  Her left tongue base biopsy on 03-24-24 revealed the following:  Final Diagnosis   A and B. LEFT TONGUE BASE, BIOPSIES:               Mucoepidermoid carcinoma, high-grade.              See comment.    Electronically signed by Margarie Cummins, MD on 03/30/2024 at 1401 EST  Comment   Block Stain Result  B1-2 p40 Positive in squamoid cells   B1-3 p16 Negative   B1-4 Mucicarmine  Positive   B1-5 Mucicarmine control Adequate     IHC and mucicarmine stains were performed with adequate controls.   After initial review of the H&E slides and clinical history, the immunohistochemical and mucicarmine stains were  required to establish the diagnosis.   Histologic sections show a nested to sheet-like proliferation of malignant squamoid and mucin-producing cells. Mitotic activity is high, with up to 12 mitoses per 10 high-power fields. Tumor necrosis is present and it is more pronounced on the frozen sections. No perineural invasion is identified in this limited biopsies. Immunohistochemical stains demonstrate that the squamoid cells are positive for p40, while the mucin-producing cells are highlighted by mucicarmine staining.    Histological features and IHC staining profile support the diagnosis of mucoepidermoid carcinoma. Based on the AFIP grading scheme with total score 8, the tumor is classified as a high-grade mucoepidermoid carcinoma.    The diagnosis of mucoepidermoid carcinoma was confirmed by another pathologist.   Addendum    After review of initial H&E morphology and clinical history, PD-L1 immunohistochemistry studies (22C3 pharmDx, [id number] B1) are performed to assist clinical treatment decision:   Combined positive score (CPS): less than 1   Note: Studies suggest that positive PD-L1 immunohistochemistry in tumor cells and/or tumor-associated immune cells may predict tumor response to therapy with immune checkpoint inhibitors. This result should not be used as the sole factor in determining treatment, as other factors (for example, tumor mutation burden, and microsatellite instability) have been also studied as predictive markers.        SCANS:  Her PET scan from 04-13-24 revealed the following: Impression  CONCLUSION: 1.  Nodular soft tissue thickening involving the tongue base with extension into the vallecula and left palatine tonsillar tissue, consistent with known primary head and neck cancer. 2.  Numerous bilateral multistation hypermetabolic and enlarged cervical lymph nodes, concerning for nodal metastatic disease. 3.  Indeterminate left adrenal nodule with mild  activity. ----------------------------------------------------------------------- DISEASE-RELATED FINDINGS:  -Nodular soft tissue thickening involving the tongue base with extension into the vallecula and left palatine tonsillar tissue, SUV max measuring up to 3.7 (images 32 through 41).  -Numerous bilateral multistation hypermetabolic and enlarged cervical lymph nodes, notably an enlarged left supraclavicular lymph node measuring up to 3.1 cm with an SUV max of 2.6 (image 52), a paratracheal/paraesophageal lymph node measuring up to 1.7 cm with SUV max of 2.6 (image 58), enlarged right supraclavicular lymph node measuring up to 2.3 cm with an SUV max of 2.) Nodes SUV max 2.4 (image 40), left 2A SUV max 2.3 (image 39).  -Left adrenal nodule SUV max 2    OTHER TRACER-RELATED FINDINGS:  Expected physiologic activity within the kidneys, ureter, bladder, oropharynx, salivary glands, stomach, bowel, and brain.   ANCILLARY CT FINDINGS:  -Moderate coronary calcifications.  -  Main pulmonary trunk is 3.3 cm, can be seen with pulmonary hypertension and appropriate setting. Scattered lung micronodules without significant activity are below the PET resolution.  -Partial left nephrectomy.  -Percutaneous gastritis tube in the appropriate position.  -Moderate atheromatous disease involving the abdominal aorta and the bilateral iliofemoral arterial systems. No aneurysmal dilation.  -Similar height loss involving the L2 compression fracture.   LABS:      Latest Ref Rng & Units 04/20/2024    9:59 AM 04/06/2024    9:21 AM 03/21/2024   10:35 AM  CBC  WBC 4.0 - 10.5 K/uL 7.9  4.6  3.8   Hemoglobin 12.0 - 15.0 g/dL 88.1  89.5  89.4   Hematocrit 36.0 - 46.0 % 36.1  33.0  31.7   Platelets 150 - 400 K/uL 206  209  183       Latest Ref Rng & Units 04/20/2024    9:59 AM 04/06/2024    9:21 AM 03/21/2024   10:35 AM  CMP  Glucose 70 - 99 mg/dL 682  768  821   BUN 8 - 23 mg/dL 28  25  14    Creatinine 0.44 - 1.00 mg/dL  9.15  8.90  8.88   Sodium 135 - 145 mmol/L 136  140  140   Potassium 3.5 - 5.1 mmol/L 4.2  4.1  4.1   Chloride 98 - 111 mmol/L 100  104  106   CO2 22 - 32 mmol/L 26  28  25    Calcium  8.9 - 10.3 mg/dL 89.6  89.0  89.9   Total Protein 6.5 - 8.1 g/dL 6.6  6.5  6.6   Total Bilirubin 0.0 - 1.2 mg/dL 0.6  0.7  0.6   Alkaline Phos 38 - 126 U/L 97  92  78   AST 15 - 41 U/L 35  35  54   ALT 0 - 44 U/L 31  17  25      ASSESSMENT & PLAN:  Assessment/Plan:  A 79 y.o. 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  In clinic today, I went over all of her PET scan images with her and her family.  Although her disease is very extensive, there appears to be no evidence of metastatic disease.  Based upon this, treatment could still theoretically be given to cure her of this disease.  The major question is, with her age and comorbidities, how much could she tolerate.  As mentioned previously, ENT of [facility] felt that the type of surgery she would need would be to physically demanding for her to tolerate.  Based upon this, I am now leaning more towards giving her concurrent chemoradiation.  Furthermore, I will have her tissue testing sent for Guardant testing to see if any form of targeted therapy could be used for her disease management.  Of note, PD-L1 testing showed her to have a combined positive score of less than 1.  This makes it very unlikely for her to have a decent response to single-agent immunotherapy.  In clinic today, we discussed all the potential toxicities that can go along with concurrent chemoradiation, including fatigue, alopecia, nausea, dysphagia, odynophagia failure to thrive, and cytopenias.  The patient wishes to take more time to think about what she wishes to do for her disease management.  Of note, comfort care only measures were discussed as well as a potential option.  I will see this patient back in 2 weeks.  Hopefully, by then, she will have made a decision about what she wishes to do regarding her disease management.  The patient and her family understand all the plans discussed today and are in agreement with them.  Madyson Lukach DELENA Kerns, MD       "

## 2024-04-20 ENCOUNTER — Inpatient Hospital Stay: Admitting: Oncology

## 2024-04-20 ENCOUNTER — Encounter: Payer: Self-pay | Admitting: Oncology

## 2024-04-20 ENCOUNTER — Ambulatory Visit
Admission: RE | Admit: 2024-04-20 | Discharge: 2024-04-20 | Disposition: A | Source: Ambulatory Visit | Attending: Radiation Oncology | Admitting: Radiation Oncology

## 2024-04-20 ENCOUNTER — Inpatient Hospital Stay

## 2024-04-20 VITALS — BP 136/60 | HR 60 | Temp 98.3°F | Resp 14 | Ht 61.0 in | Wt 141.0 lb

## 2024-04-20 DIAGNOSIS — J358 Other chronic diseases of tonsils and adenoids: Secondary | ICD-10-CM | POA: Diagnosis not present

## 2024-04-20 DIAGNOSIS — C760 Malignant neoplasm of head, face and neck: Secondary | ICD-10-CM

## 2024-04-20 LAB — CBC WITH DIFFERENTIAL (CANCER CENTER ONLY)
Abs Immature Granulocytes: 0.04 10*3/uL (ref 0.00–0.07)
Basophils Absolute: 0 10*3/uL (ref 0.0–0.1)
Basophils Relative: 0 %
Eosinophils Absolute: 0.1 10*3/uL (ref 0.0–0.5)
Eosinophils Relative: 1 %
HCT: 36.1 % (ref 36.0–46.0)
Hemoglobin: 11.8 g/dL — ABNORMAL LOW (ref 12.0–15.0)
Immature Granulocytes: 1 %
Lymphocytes Relative: 16 %
Lymphs Abs: 1.3 10*3/uL (ref 0.7–4.0)
MCH: 28.8 pg (ref 26.0–34.0)
MCHC: 32.7 g/dL (ref 30.0–36.0)
MCV: 88 fL (ref 80.0–100.0)
Monocytes Absolute: 0.6 10*3/uL (ref 0.1–1.0)
Monocytes Relative: 7 %
Neutro Abs: 5.9 10*3/uL (ref 1.7–7.7)
Neutrophils Relative %: 75 %
Platelet Count: 206 10*3/uL (ref 150–400)
RBC: 4.1 MIL/uL (ref 3.87–5.11)
RDW: 15.4 % (ref 11.5–15.5)
WBC Count: 7.9 10*3/uL (ref 4.0–10.5)
nRBC: 0 % (ref 0.0–0.2)

## 2024-04-20 LAB — CMP (CANCER CENTER ONLY)
ALT: 31 U/L (ref 0–44)
AST: 35 U/L (ref 15–41)
Albumin: 3.8 g/dL (ref 3.5–5.0)
Alkaline Phosphatase: 97 U/L (ref 38–126)
Anion gap: 11 (ref 5–15)
BUN: 28 mg/dL — ABNORMAL HIGH (ref 8–23)
CO2: 26 mmol/L (ref 22–32)
Calcium: 10.3 mg/dL (ref 8.9–10.3)
Chloride: 100 mmol/L (ref 98–111)
Creatinine: 0.84 mg/dL (ref 0.44–1.00)
GFR, Estimated: >60 mL/min
Glucose, Bld: 317 mg/dL — ABNORMAL HIGH (ref 70–99)
Potassium: 4.2 mmol/L (ref 3.5–5.1)
Sodium: 136 mmol/L (ref 135–145)
Total Bilirubin: 0.6 mg/dL (ref 0.0–1.2)
Total Protein: 6.6 g/dL (ref 6.5–8.1)

## 2024-04-21 ENCOUNTER — Telehealth: Payer: Self-pay | Admitting: Dietician

## 2024-04-21 NOTE — Telephone Encounter (Signed)
 Daughter Dan called to let me know patient has been prescribed Lantus to Improve her blood sugar control.  However, they have not yet got the syringes.  .cds

## 2024-04-21 NOTE — Progress Notes (Signed)
 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 female seen at the request of Dr. Ezzard.  Earlier this year, she was found to have an abnormality involving her head and neck after presenting with shortness of breath and dysphagia.  CT imaging of the head and neck performed in early January revealed a large heterogeneously enhancing soft tissue mass involving the posterior tongue, vallecula, epiglottis, and bilateral nasopharynx/oropharynx.  Also seen for enlarged lymph nodes in the submandibular region and bilateral neck.  She subsequently underwent PET/CT imaging, revealing increased uptake involving the tongue base, with extension into the vallecula and left palatine tonsil.  Multiple hypermetabolic lymph nodes were seen in the bilateral neck, including the supraclavicular region, and paratracheal/para esophageal region.  Biopsy of the left base of tongue was performed, revealing high-grade mucoepidermoid carcinoma.  She has been seen in consultation by Dr. Ezzard, and these pathologic and radiologic findings have been discussed.  Consultation is requested regarding the potential role of radiation in her care.    PREVIOUS RADIATION THERAPY: No   PAST MEDICAL HISTORY:  Past Medical History:  Diagnosis Date   Abnormal nuclear stress test 09/09/2017   Acid reflux disease 05/05/2017   Anemia of chronic kidney failure, unspecified stage 01/16/2022   Atrophic vaginitis 08/14/2016   Cancer of kidney (HCC) 10/04/2015   Cervical spondylosis without myelopathy    Chronic pain 05/05/2017   Overview:   back and right side     CKD (chronic kidney disease)  stage 3, GFR 30-59 ml/min (HCC) 10/27/2017   Colon polyps    Coronary artery disease involving native coronary artery of native heart with angina pectoris 01/01/2017   Diabetes mellitus (HCC) 12/31/2016   Diverticulitis of colon (without mention of hemorrhage)(562.11)    ESA (erythropoietin stimulating agent) anemia management patient 05/05/2017   Hard of hearing 05/05/2017   Heart disease    History of heart artery stent 04/02/2023   History of kidney cancer    History of nephrectomy 03/19/2016   History of recurrent UTI (urinary tract infection) 05/05/2017   Hyperlipidemia 05/05/2017   Hypertension 12/31/2016   Iron deficiency anemia due to chronic blood loss 12/19/2021   Iron deficiency anemia, unspecified    Malignant neoplasm of left kidney (HCC) 10/09/2017   Osteoarthritis of shoulder    Osteoporosis    Restless legs syndrome (RLS)    Sciatica, right side 05/05/2017   Stable angina pectoris 05/11/2017   Urinary tract infection 09/24/2015       PAST SURGICAL HISTORY: Past Surgical History:  Procedure Laterality Date   APPENDECTOMY  03/18/1975   BUNIONECTOMY     CORONARY BALLOON ANGIOPLASTY N/A 11/09/2017   Procedure: CORONARY BALLOON ANGIOPLASTY;  Surgeon: Jordan, Peter M, MD;  Location: [facility] INVASIVE CV LAB;  Service: Cardiovascular;  Laterality: N/A;   CORONARY STENT INTERVENTION N/A 01/01/2017   Procedure: CORONARY STENT INTERVENTION;  Surgeon: Jordan, Peter M, MD;  Location: [facility] INVASIVE CV LAB;  Service: Cardiovascular;  Laterality: N/A;   KNEE SURGERY     LEFT HEART CATH AND CORONARY ANGIOGRAPHY N/A 01/01/2017   Procedure: LEFT HEART  CATH AND CORONARY ANGIOGRAPHY;  Surgeon: Jordan, Peter M, MD;  Location: [facility] INVASIVE CV LAB;  Service: Cardiovascular;  Laterality: N/A;   LEFT HEART CATH AND CORONARY ANGIOGRAPHY N/A 11/09/2017   Procedure: LEFT HEART CATH AND CORONARY ANGIOGRAPHY;  Surgeon: Jordan, Peter M, MD;  Location: [facility] INVASIVE CV LAB;  Service: Cardiovascular;   Laterality: N/A;   PARTIAL NEPHRECTOMY Left 2017   VAGINAL HYSTERECTOMY  03/18/1975     FAMILY HISTORY:  Family History  Problem Relation Age of Onset   Diabetes Maternal Grandfather    Heart attack Father    Diabetes Sister    Hypertension Sister    Diabetes Brother    Hypertension Brother      SOCIAL HISTORY:  reports that she has never smoked. She has never used smokeless tobacco. She reports that she does not drink alcohol and does not use drugs.   ALLERGIES: Gabapentin, Ticagrelor, and Codeine   MEDICATIONS:  Current Outpatient Medications  Medication Sig Dispense Refill   Acetaminophen  (TYLENOL  EXTRA STRENGTH PO) Take by mouth.     aspirin  81 MG tablet Take 81 mg by mouth daily.     atorvastatin  (LIPITOR) 80 MG tablet Take 1 tablet (80 mg total) by mouth daily. 30 tablet 0   Calcium  Carb-Cholecalciferol  (CALCIUM /VITAMIN D  PO) Take 1 tablet by mouth daily. 1200-1000 mg-unit     dexamethasone  (DECADRON ) 4 MG tablet Take 1 tablet (4 mg total) by mouth daily. 7 tablet 0   estradiol  (ESTRACE ) 0.1 MG/GM vaginal cream Place 1 Applicatorful vaginally 3 (three) times a week.     ezetimibe (ZETIA) 10 MG tablet Take 10 mg by mouth daily.   3   Ferrous Sulfate  (IRON) 325 (65 Fe) MG TABS Take 325 mg by mouth every other day.     glimepiride (AMARYL) 4 MG tablet Take 4 mg by mouth daily with breakfast.   3   HYDROmorphone  (DILAUDID ) 2 MG tablet Take 1 tablet (2 mg total) by mouth every 6 (six) hours as needed for severe pain (pain score 7-10). 60 tablet 0   LORazepam  (ATIVAN ) 0.5 MG tablet Take 1 tablet (0.5 mg total) by mouth every 8 (eight) hours. 30 tablet 0   metoprolol  tartrate (LOPRESSOR ) 25 MG tablet TAKE 1/2 TABLET(12.5 MG) BY MOUTH TWICE DAILY 90 tablet 0   Nutritional Supplements (ISOSOURCE 1.5 CAL) LIQD Isosource 1.5 boluses (250 mL each) 6x daily with free water flushes 60 mL pre and 120 ml post bolus and supplies. This regimen will provide 2250 kcal, 102g protein, and 2226  mL free water. 45000 mL 12   olmesartan (BENICAR) 20 MG tablet Take 20 mg by mouth daily.     oxyCODONE  (OXY IR/ROXICODONE ) 5 MG immediate release tablet Take 1 tablet (5 mg total) by mouth every 4 (four) hours as needed for severe pain (pain score 7-10). 60 tablet 0   Probiotic Product (PROBIOTIC PO) Take 1 capsule by mouth daily.     ranolazine  (RANEXA ) 500 MG 12 hr tablet Take 1 tablet (500 mg total) by mouth 2 (two) times daily. KEEP OV. 180 tablet 0   No current facility-administered medications for this encounter.     REVIEW OF SYSTEMS: On review of systems, the patient reports that she is doing poorly overall.  She is not able to swallow without pain/gagging, and receives all of her calories by a PEG tube.  She is also bothered hoarseness and sore throat she has lost weight, though she is unable to quantify how much.  He denies any chest pain, shortness of breath, cough, fevers, chills, night sweats.  She denies any bowel or bladder disturbances, and denies abdominal pain, nausea or vomiting.  She denies any new musculoskeletal or joint aches or pains. A complete review of systems is obtained and is otherwise negative.     PHYSICAL EXAM:  Wt Readings from Last 3 Encounters:  04/20/24 141 lb (64 kg)  04/06/24 142 lb 11.2 oz (64.7 kg)  03/21/24 147 lb 11.2 oz (67 kg)   Temp Readings from Last 3 Encounters:  04/20/24 98.3 F (36.8 C) (Oral)  04/06/24 98.2 F (36.8 C) (Oral)  03/21/24 98.6 F (37 C) (Oral)   BP Readings from Last 3 Encounters:  04/20/24 136/60  04/06/24 (!) 112/59  04/06/24 (!) 89/50   Pulse Readings from Last 3 Encounters:  04/20/24 60  04/06/24 63  04/06/24 74    /10  In general this is a thin female in no acute distress.  She's alert and oriented x4 and appropriate throughout the examination. Cardiopulmonary assessment is negative for acute distress and she exhibits normal effort.  Palpable cervical lymphadenopathy is noted.    ECOG = 1  0 -  Asymptomatic (Fully active, able to carry on all predisease activities without restriction)  1 - Symptomatic but completely ambulatory (Restricted in physically strenuous activity but ambulatory and able to carry out work of a light or sedentary nature. For example, light housework, office work)  2 - Symptomatic, <50% in bed during the day (Ambulatory and capable of all self care but unable to carry out any work activities. Up and about more than 50% of waking hours)  3 - Symptomatic, >50% in bed, but not bedbound (Capable of only limited self-care, confined to bed or chair 50% or more of waking hours)  4 - Bedbound (Completely disabled. Cannot carry on any self-care. Totally confined to bed or chair)  5 - Death   Julia Logan, Creech RH, Tormey DC, et al. [phone]). Toxicity and response criteria of the [facility]. Am. Julia Logan. Oncol. 5 (6): 649-55    LABORATORY DATA:  Lab Results  Component Value Date   WBC 7.9 04/20/2024   HGB 11.8 (L) 04/20/2024   HCT 36.1 04/20/2024   MCV 88.0 04/20/2024   PLT 206 04/20/2024   Lab Results  Component Value Date   NA 136 04/20/2024   K 4.2 04/20/2024   CL 100 04/20/2024   CO2 26 04/20/2024   Lab Results  Component Value Date   ALT 31 04/20/2024   AST 35 04/20/2024   ALKPHOS 97 04/20/2024   BILITOT 0.6 04/20/2024         IMPRESSION/PLAN: 1.  The patient is a 79 year old female with locally advanced high-grade mucoepidermoid carcinoma of the head and neck.  I reviewed with the patient and her family the potential role of external beam radiation in her care.  I reviewed the logistics of external beam radiation, as well as its potential side effects.  I explained that these may include, but are not limited to, fatigue, irritation of the skin of the head and neck, irritation of the oropharyngeal and nasopharyngeal mucosa, sore throat, and taste impairment.  I explained that potential long-term side effects of radiation may  include, but are not limited to, chronic injury to bone, salivary glands, and other normal tissues.  Julia Logan and her family expressed understanding.  Currently, Julia Logan wishes to discuss treatment options with her family, and contact us   regarding her she wishes to proceed.  I gave her a copy of my business card, and encouraged her to contact me at anytime with any questions or concerns she may have.   In a visit lasting 50 minutes, greater than 50% of the time was spent face to face discussing the patient's condition, in preparation for the discussion, and coordinating the patient's care.    JOMARIE LYNWOOD LABOR., MD    **Disclaimer: This note was dictated with voice recognition software. Similar sounding words can inadvertently be transcribed and this note may contain transcription errors which may not have been corrected upon publication of note.**  "

## 2024-04-26 ENCOUNTER — Inpatient Hospital Stay: Admitting: Dietician

## 2024-05-06 ENCOUNTER — Other Ambulatory Visit

## 2024-05-06 ENCOUNTER — Ambulatory Visit: Admitting: Oncology

## 2024-05-19 ENCOUNTER — Ambulatory Visit: Admitting: Cardiology

## 2024-07-08 ENCOUNTER — Ambulatory Visit: Admitting: Neurology
